# Patient Record
Sex: Female | Born: 1970 | Race: Black or African American | Hispanic: No | Marital: Single | Smoking: Never smoker
Health system: Southern US, Community
[De-identification: ages and names within clinical notes are randomized; demographics above are authoritative.]

## PROBLEM LIST (undated history)

## (undated) VITALS — BP 107/77 | HR 78 | Temp 96.9°F | Resp 18

## (undated) DIAGNOSIS — C787 Secondary malignant neoplasm of liver and intrahepatic bile duct: Secondary | ICD-10-CM

## (undated) DIAGNOSIS — IMO0002 Reserved for concepts with insufficient information to code with codable children: Secondary | ICD-10-CM

## (undated) DIAGNOSIS — E669 Obesity, unspecified: Secondary | ICD-10-CM

## (undated) DIAGNOSIS — K5900 Constipation, unspecified: Secondary | ICD-10-CM

## (undated) DIAGNOSIS — C801 Malignant (primary) neoplasm, unspecified: Secondary | ICD-10-CM

## (undated) DIAGNOSIS — D649 Anemia, unspecified: Secondary | ICD-10-CM

## (undated) DIAGNOSIS — C189 Malignant neoplasm of colon, unspecified: Secondary | ICD-10-CM

## (undated) DIAGNOSIS — G43909 Migraine, unspecified, not intractable, without status migrainosus: Secondary | ICD-10-CM

## (undated) HISTORY — DX: Constipation, unspecified: K59.00

## (undated) HISTORY — DX: Malignant neoplasm of colon, unspecified: C18.9

## (undated) HISTORY — DX: Migraine, unspecified, not intractable, without status migrainosus: G43.909

## (undated) HISTORY — DX: Reserved for concepts with insufficient information to code with codable children: IMO0002

## (undated) HISTORY — DX: Anemia, unspecified: D64.9

## (undated) HISTORY — DX: Secondary malignant neoplasm of liver and intrahepatic bile duct: C78.7

## (undated) HISTORY — DX: Malignant (primary) neoplasm, unspecified: C80.1

## (undated) HISTORY — DX: Obesity, unspecified: E66.9

---

## 2003-10-28 HISTORY — PX: TUBAL LIGATION: SHX77

## 2004-06-10 ENCOUNTER — Inpatient Hospital Stay (HOSPITAL_COMMUNITY): Admission: AD | Admit: 2004-06-10 | Discharge: 2004-06-12 | Payer: Self-pay | Admitting: Gynecology

## 2006-04-03 ENCOUNTER — Emergency Department: Payer: Self-pay | Admitting: Emergency Medicine

## 2006-11-05 ENCOUNTER — Ambulatory Visit: Payer: Self-pay | Admitting: Obstetrics and Gynecology

## 2010-12-11 ENCOUNTER — Ambulatory Visit: Payer: Self-pay | Admitting: Obstetrics and Gynecology

## 2012-01-01 ENCOUNTER — Ambulatory Visit: Payer: Self-pay | Admitting: Obstetrics and Gynecology

## 2012-01-13 ENCOUNTER — Ambulatory Visit: Payer: Self-pay | Admitting: Obstetrics and Gynecology

## 2012-02-09 ENCOUNTER — Ambulatory Visit: Payer: Self-pay | Admitting: Surgery

## 2012-10-27 HISTORY — PX: CHOLECYSTECTOMY: SHX55

## 2013-04-04 ENCOUNTER — Ambulatory Visit: Payer: Self-pay | Admitting: Family Medicine

## 2013-04-18 ENCOUNTER — Ambulatory Visit: Payer: Self-pay | Admitting: Surgery

## 2013-04-18 LAB — CBC WITH DIFFERENTIAL/PLATELET
Basophil #: 0.1 10*3/uL (ref 0.0–0.1)
Eosinophil #: 0.2 10*3/uL (ref 0.0–0.7)
Eosinophil %: 2.5 %
HCT: 35 % (ref 35.0–47.0)
HGB: 11.7 g/dL — ABNORMAL LOW (ref 12.0–16.0)
Lymphocyte #: 2 10*3/uL (ref 1.0–3.6)
MCHC: 33.4 g/dL (ref 32.0–36.0)
MCV: 81 fL (ref 80–100)
Monocyte %: 8.3 %
RBC: 4.35 10*6/uL (ref 3.80–5.20)
WBC: 7.3 10*3/uL (ref 3.6–11.0)

## 2013-04-18 LAB — COMPREHENSIVE METABOLIC PANEL
Chloride: 110 mmol/L — ABNORMAL HIGH (ref 98–107)
Co2: 30 mmol/L (ref 21–32)
EGFR (Non-African Amer.): >60
Glucose: 77 mg/dL (ref 65–99)
Osmolality: 281 (ref 275–301)
Potassium: 3.7 mmol/L (ref 3.5–5.1)
SGOT(AST): 9 U/L — ABNORMAL LOW (ref 15–37)
SGPT (ALT): 14 U/L (ref 12–78)
Sodium: 143 mmol/L (ref 136–145)
Total Protein: 6.9 g/dL (ref 6.4–8.2)

## 2013-04-25 ENCOUNTER — Ambulatory Visit: Payer: Self-pay | Admitting: Surgery

## 2013-04-25 LAB — HEPATIC FUNCTION PANEL A (ARMC)
Alkaline Phosphatase: 53 U/L (ref 50–136)
Bilirubin, Direct: 0.1 mg/dL (ref 0.00–0.20)
Bilirubin,Total: 0.6 mg/dL (ref 0.2–1.0)

## 2013-04-28 ENCOUNTER — Ambulatory Visit: Payer: Self-pay | Admitting: Surgery

## 2013-05-02 LAB — PATHOLOGY REPORT

## 2013-10-11 ENCOUNTER — Emergency Department: Payer: Self-pay | Admitting: Emergency Medicine

## 2013-10-11 LAB — URINALYSIS, COMPLETE
Bacteria: NONE SEEN
Bilirubin,UR: NEGATIVE
Glucose,UR: NEGATIVE mg/dL (ref 0–75)
Ketone: NEGATIVE
Ph: 7 (ref 4.5–8.0)
Protein: NEGATIVE
RBC,UR: 1 /HPF (ref 0–5)
Squamous Epithelial: 7

## 2013-10-11 LAB — COMPREHENSIVE METABOLIC PANEL
Anion Gap: 3 — ABNORMAL LOW (ref 7–16)
BUN: 6 mg/dL — ABNORMAL LOW (ref 7–18)
Bilirubin,Total: 0.4 mg/dL (ref 0.2–1.0)
Calcium, Total: 9.5 mg/dL (ref 8.5–10.1)
Chloride: 107 mmol/L (ref 98–107)
Co2: 28 mmol/L (ref 21–32)
Creatinine: 0.81 mg/dL (ref 0.60–1.30)
EGFR (Non-African Amer.): >60
Osmolality: 273 (ref 275–301)
SGOT(AST): 22 U/L (ref 15–37)
Sodium: 138 mmol/L (ref 136–145)
Total Protein: 7.6 g/dL (ref 6.4–8.2)

## 2013-10-11 LAB — CBC
HGB: 10.4 g/dL — ABNORMAL LOW (ref 12.0–16.0)
MCHC: 32.3 g/dL (ref 32.0–36.0)
MCV: 73 fL — ABNORMAL LOW (ref 80–100)
RBC: 4.38 10*6/uL (ref 3.80–5.20)
RDW: 15.7 % — ABNORMAL HIGH (ref 11.5–14.5)

## 2013-10-11 LAB — LIPASE, BLOOD: Lipase: 138 U/L (ref 73–393)

## 2013-10-13 LAB — URINE CULTURE

## 2013-10-27 DIAGNOSIS — C801 Malignant (primary) neoplasm, unspecified: Secondary | ICD-10-CM

## 2013-10-27 HISTORY — DX: Malignant (primary) neoplasm, unspecified: C80.1

## 2013-10-27 HISTORY — PX: UPPER GI ENDOSCOPY: SHX6162

## 2013-10-27 HISTORY — PX: COLONOSCOPY: SHX174

## 2014-01-25 DIAGNOSIS — C189 Malignant neoplasm of colon, unspecified: Secondary | ICD-10-CM

## 2014-01-25 DIAGNOSIS — C787 Secondary malignant neoplasm of liver and intrahepatic bile duct: Secondary | ICD-10-CM

## 2014-01-25 HISTORY — DX: Secondary malignant neoplasm of liver and intrahepatic bile duct: C78.7

## 2014-01-25 HISTORY — DX: Malignant neoplasm of colon, unspecified: C18.9

## 2014-02-02 ENCOUNTER — Ambulatory Visit: Payer: Self-pay | Admitting: Gastroenterology

## 2014-02-03 ENCOUNTER — Ambulatory Visit: Payer: Self-pay | Admitting: Oncology

## 2014-02-03 ENCOUNTER — Ambulatory Visit: Payer: Self-pay | Admitting: Obstetrics and Gynecology

## 2014-02-03 LAB — HEMOGLOBIN: HGB: 7.7 g/dL — ABNORMAL LOW (ref 12.0–16.0)

## 2014-02-04 LAB — CEA: CEA: 7.9 ng/mL — ABNORMAL HIGH (ref 0.0–4.7)

## 2014-02-04 LAB — CANCER ANTIGEN 19-9: CA 19-9: <1 U/mL (ref 0–35)

## 2014-02-06 ENCOUNTER — Ambulatory Visit: Payer: Self-pay | Admitting: Gastroenterology

## 2014-02-07 ENCOUNTER — Ambulatory Visit: Payer: Self-pay | Admitting: Obstetrics and Gynecology

## 2014-02-08 LAB — PATHOLOGY REPORT

## 2014-02-09 ENCOUNTER — Encounter: Payer: Self-pay | Admitting: General Surgery

## 2014-02-09 ENCOUNTER — Ambulatory Visit (INDEPENDENT_AMBULATORY_CARE_PROVIDER_SITE_OTHER): Payer: 59 | Admitting: General Surgery

## 2014-02-09 VITALS — BP 110/70 | HR 74 | Resp 14 | Ht 65.0 in | Wt 158.0 lb

## 2014-02-09 DIAGNOSIS — C189 Malignant neoplasm of colon, unspecified: Secondary | ICD-10-CM

## 2014-02-09 DIAGNOSIS — D500 Iron deficiency anemia secondary to blood loss (chronic): Secondary | ICD-10-CM

## 2014-02-09 DIAGNOSIS — K869 Disease of pancreas, unspecified: Secondary | ICD-10-CM

## 2014-02-09 DIAGNOSIS — K8689 Other specified diseases of pancreas: Secondary | ICD-10-CM

## 2014-02-09 LAB — COMPREHENSIVE METABOLIC PANEL
ALBUMIN: 2.9 g/dL — AB (ref 3.4–5.0)
ALK PHOS: 68 U/L
AST: 11 U/L — AB (ref 15–37)
Anion Gap: 11 (ref 7–16)
BILIRUBIN TOTAL: 0.3 mg/dL (ref 0.2–1.0)
BUN: 3 mg/dL — AB (ref 7–18)
CALCIUM: 9.3 mg/dL (ref 8.5–10.1)
CHLORIDE: 105 mmol/L (ref 98–107)
CO2: 27 mmol/L (ref 21–32)
Creatinine: 0.79 mg/dL (ref 0.60–1.30)
EGFR (African American): >60
EGFR (Non-African Amer.): >60
GLUCOSE: 101 mg/dL — AB (ref 65–99)
Osmolality: 282 (ref 275–301)
POTASSIUM: 3 mmol/L — AB (ref 3.5–5.1)
SGPT (ALT): 11 U/L — ABNORMAL LOW (ref 12–78)
Sodium: 143 mmol/L (ref 136–145)
Total Protein: 7.3 g/dL (ref 6.4–8.2)

## 2014-02-09 LAB — CBC CANCER CENTER
BASOS PCT: 0.8 %
Basophil #: 0.1 x10 3/mm (ref 0.0–0.1)
Eosinophil #: 0.3 x10 3/mm (ref 0.0–0.7)
Eosinophil %: 5 %
HCT: 27 % — AB (ref 35.0–47.0)
HGB: 8.3 g/dL — ABNORMAL LOW (ref 12.0–16.0)
LYMPHS ABS: 1.6 x10 3/mm (ref 1.0–3.6)
Lymphocyte %: 23.7 %
MCH: 21.3 pg — AB (ref 26.0–34.0)
MCHC: 30.9 g/dL — AB (ref 32.0–36.0)
MCV: 69 fL — ABNORMAL LOW (ref 80–100)
Monocyte #: 0.5 x10 3/mm (ref 0.2–0.9)
Monocyte %: 7.5 %
Neutrophil #: 4.3 x10 3/mm (ref 1.4–6.5)
Neutrophil %: 63 %
Platelet: 518 x10 3/mm — ABNORMAL HIGH (ref 150–440)
RBC: 3.92 10*6/uL (ref 3.80–5.20)
RDW: 20.4 % — ABNORMAL HIGH (ref 11.5–14.5)
WBC: 6.8 x10 3/mm (ref 3.6–11.0)

## 2014-02-09 NOTE — Patient Instructions (Addendum)
The patient is aware to call back for any questions or concerns.  Patient is scheduled for surgery at [facility] on 02/17/14. She will pre admit by phone on 02/13/14.

## 2014-02-09 NOTE — Progress Notes (Signed)
Patient ID: Misty Mack, female   DOB: 08/10/1971, 43 y.o.   MRN: [id number]  Chief Complaint  Patient presents with  . Other    mass in colon    HPI Misty Mack is a 43 y.o. female who presents for an evaluation of a mass located in the colon. The patient had symptoms of weight loss and abdominal pain that prompted a CT scan to be done in March. This suggested a mass in the distal transverse colon. Colonoscopy was attempted in March 2015, the patient was unable to tolerate the prep. The patient subsequently had a colonoscopy on April 13th showing a near obstructing mass in the distal transverse colon. The patient states she started having abdominal pain in November 2014. She has had a weight loss of 35-40 lbs.  CT scan identified a mass in the mid body of the pancreas as well. The patient has been evaluated by medical oncology. Urgent surgical assessment was requested to to fears of colonic obstruction.  The patient is not aware of seeing any blood in her bowels. She had noticed an increasing sense of constipation. She does admit to food for age to minimize discomfort. 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  The patient had undergone cholecystectomy and 2014 under the care of Misty Mack, M.D. Her mid abdominal pain improved after that procedure. One recurrent abdominal pain developed in November 2014 it was a different character than that she experienced earlier. She had been quite happy with Misty Mack care. Several family members have been cared for through this office, and she had requested assessment here for her current issues.  HPI  Past Medical History  Diagnosis Date  . Ulcer   . Anemia   . Cancer 2015    colon    Past Surgical History  Procedure Laterality Date  . Cholecystectomy  2014  . Tubal ligation  2005  . Colonoscopy  2015  . Upper gi endoscopy  2015    History reviewed. No pertinent family  history.  Social History History  Substance Use Topics  . Smoking status: Never Smoker   . Smokeless tobacco: Not on file  . Alcohol Use: No    No Known Allergies  Current Outpatient Prescriptions  Medication Sig Dispense Refill  . acetaminophen (TYLENOL) 500 MG tablet Take 500 mg by mouth every 6 (six) hours as needed.      . ferrous fumarate-iron polysaccharide complex (TANDEM) 162-115.2 MG CAPS Take 1 capsule by mouth 2 (two) times daily.      . pantoprazole (PROTONIX) 40 MG tablet Take 40 mg by mouth 2 (two) times daily.      . potassium chloride (K-DUR,KLOR-CON) 10 MEQ tablet 10 meq 2 times daily for 5 days.       No current facility-administered medications for this visit.    Review of Systems Review of Systems  Constitutional: Positive for unexpected weight change.  Respiratory: Negative.   Cardiovascular: Negative.     Blood pressure 110/70, pulse 74, resp. rate 14, height 5\' 5"  (1.651 m), weight 158 lb (71.668 kg), last menstrual period 01/11/2014.  Physical Exam Physical Exam  Constitutional: She is oriented to person, place, and time. She appears well-developed and well-nourished.  Neck: Neck supple. No thyromegaly present.  Cardiovascular: Normal rate, regular rhythm, normal heart sounds and normal pulses.   No murmur heard. Pulmonary/Chest: Effort normal and breath sounds normal.  Abdominal: Soft. Normal appearance and bowel sounds are  normal. There is no hepatosplenomegaly. There is no tenderness. No hernia.  Lymphadenopathy:    She has no cervical adenopathy.  Neurological: She is alert and oriented to person, place, and time.  Skin: Skin is warm and dry.    Data Reviewed Operative note 2014 were reviewed.  Laboratory and radiologic studies 2015 were personally reviewed.  Recently completed CT shows a 3 cm hypodense mass in the body of the pancreas lateral to the SMA. There is thickening of the bowel wall the distal transverse colon just proximal to  splenic flexure. Possible enlarged node in the mesentery.  Elevated CEA at 9.4. Normal CA 19-9 at less than one.  Marked anemia responding to intravenous iron therapy with a hemoglobin of 8.3. Laboratory studies from 2014 showed a normal hemoglobin.  Assessment    Carcinoma of the transverse colon.  Pancreatic mass.  Anemia secondary to blood loss.     Plan    The patient's case was trans-discussed at the [facility] tumor board after her office evaluation. The main question was whether referral to a tertiary center for combined pancreatectomy and colon resection was urgently required or whether management of the colon cancer without pancreatectomy, lowering the risk for complications from both procedures would be appropriate. The consensus of bleeding was that with the normal CEA 19-9 this is likely a benign pancreatic process and that this could be done with a separate time. If possible, FNA of the pancreatic area could be obtained during her operative procedure.  The patient medical history was reviewed with Misty Mack, M.D. From anesthesia. He did not feel the patient required in person interview, but did request a type and screen.     Patient is scheduled for surgery at [facility] on 02/17/14. She will pre admit by phone on 02/13/14.  PCP: Misty Mack Ref. MD: Dr. Belinda Block Mack 02/10/2014, 5:07 AM

## 2014-02-10 ENCOUNTER — Telehealth: Payer: Self-pay

## 2014-02-10 ENCOUNTER — Other Ambulatory Visit: Payer: Self-pay | Admitting: General Surgery

## 2014-02-10 DIAGNOSIS — C189 Malignant neoplasm of colon, unspecified: Secondary | ICD-10-CM

## 2014-02-10 DIAGNOSIS — D500 Iron deficiency anemia secondary to blood loss (chronic): Secondary | ICD-10-CM | POA: Insufficient documentation

## 2014-02-10 DIAGNOSIS — K8689 Other specified diseases of pancreas: Secondary | ICD-10-CM | POA: Insufficient documentation

## 2014-02-10 MED ORDER — METRONIDAZOLE 500 MG PO TABS
500.0000 mg | ORAL_TABLET | Freq: Once | ORAL | Status: AC
Start: 2014-02-10 — End: 2014-02-20

## 2014-02-10 MED ORDER — NEOMYCIN SULFATE 500 MG PO TABS: 1000.0000 mg | ORAL_TABLET | Freq: Once | ORAL | Status: AC

## 2014-02-10 NOTE — Telephone Encounter (Signed)
Message copied by Lesly Rubenstein on Fri Feb 10, 2014 10:12 AM ------      Message from: [place], [name]      Created: Fri Feb 10, 2014  5:16 AM       Prepped for upcoming colon surgery:            Full at the diet beginning Wednesday, April 22. Used to Ensure supplements as encouraged.            Citrate of magnesia: 10 ounces by mouth on Thursday, April 23.            1 quart tap water enema a.m. The morning of surgery at home.            Neomycin (1 gm) and Flagyl (500 mg): 6 PM and 11 PM the night prior to surgery. ------

## 2014-02-10 NOTE — Telephone Encounter (Signed)
Reviewed colon surgery prep with patient. Also notified the patient that she would be asked to sign a consent for blood transfusion. She was amendable to this. Patient is to start a full liquid diet on 02/15/14 and to use Ensure supplements. On 02/16/14 patient is to start clear liquids and will take 10 oz of citrate of magnesia. At 6 pm on 02/16/14 she will take 1 gm Neomycin and 500mg  of Flagyl, she will repeat this at 11pm. She is to be NPO after 12 midnight the night before her surgery. On 02/17/14 she will do a 1 quart tap water enema at 8 am at her home. Prescriptions have been sent to patient's pharmacy.

## 2014-02-13 ENCOUNTER — Ambulatory Visit: Payer: Self-pay | Admitting: General Surgery

## 2014-02-13 LAB — POTASSIUM: Potassium: 2.9 mmol/L — ABNORMAL LOW (ref 3.5–5.1)

## 2014-02-14 ENCOUNTER — Encounter: Payer: Self-pay | Admitting: General Surgery

## 2014-02-14 ENCOUNTER — Telehealth: Payer: Self-pay | Admitting: *Deleted

## 2014-02-14 NOTE — Telephone Encounter (Signed)
Pt notified. RX called in.

## 2014-02-14 NOTE — Telephone Encounter (Signed)
Message copied by Carson Myrtle on Tue Feb 14, 2014  5:06 PM ------      Message from: Robert Bellow      Created: Tue Feb 14, 2014  4:38 PM       Patient previously on KCL 10 mEq BID. If she has completed this RX, send RX for KCL 20 mEq  (#30) in preparation for surgery on April 24.   Thanks.      ----- Message -----         From: Darrin Nipper, CMA         Sent: 02/14/2014   9:27 AM           To: Robert Bellow, MD                   ------

## 2014-02-17 ENCOUNTER — Inpatient Hospital Stay: Payer: Self-pay | Admitting: General Surgery

## 2014-02-17 DIAGNOSIS — D379 Neoplasm of uncertain behavior of digestive organ, unspecified: Secondary | ICD-10-CM

## 2014-02-17 DIAGNOSIS — C185 Malignant neoplasm of splenic flexure: Secondary | ICD-10-CM

## 2014-02-18 LAB — CREATININE, SERUM
Creatinine: 0.61 mg/dL (ref 0.60–1.30)
EGFR (African American): >60

## 2014-02-19 LAB — CBC WITH DIFFERENTIAL/PLATELET
Basophil #: 0 10*3/uL (ref 0.0–0.1)
Basophil %: 0.5 %
EOS PCT: 2.1 %
Eosinophil #: 0.2 10*3/uL (ref 0.0–0.7)
HCT: 24.2 % — ABNORMAL LOW (ref 35.0–47.0)
HGB: 7.9 g/dL — AB (ref 12.0–16.0)
Lymphocyte #: 1.2 10*3/uL (ref 1.0–3.6)
Lymphocyte %: 14 %
MCH: 23.9 pg — AB (ref 26.0–34.0)
MCHC: 32.5 g/dL (ref 32.0–36.0)
MCV: 74 fL — AB (ref 80–100)
MONOS PCT: 8.8 %
Monocyte #: 0.8 x10 3/mm (ref 0.2–0.9)
NEUTROS PCT: 74.6 %
Neutrophil #: 6.4 10*3/uL (ref 1.4–6.5)
PLATELETS: 246 10*3/uL (ref 150–440)
RBC: 3.29 10*6/uL — ABNORMAL LOW (ref 3.80–5.20)
RDW: 27.1 % — ABNORMAL HIGH (ref 11.5–14.5)
WBC: 8.6 10*3/uL (ref 3.6–11.0)

## 2014-02-19 LAB — LIPASE, BLOOD: LIPASE: 67 U/L — AB (ref 73–393)

## 2014-02-19 LAB — BASIC METABOLIC PANEL
Anion Gap: 4 — ABNORMAL LOW (ref 7–16)
BUN: 5 mg/dL — ABNORMAL LOW (ref 7–18)
CHLORIDE: 107 mmol/L (ref 98–107)
CO2: 31 mmol/L (ref 21–32)
Calcium, Total: 9 mg/dL (ref 8.5–10.1)
Creatinine: 0.66 mg/dL (ref 0.60–1.30)
GLUCOSE: 96 mg/dL (ref 65–99)
OSMOLALITY: 280 (ref 275–301)
POTASSIUM: 3.8 mmol/L (ref 3.5–5.1)
Sodium: 142 mmol/L (ref 136–145)

## 2014-02-20 ENCOUNTER — Encounter: Payer: Self-pay | Admitting: General Surgery

## 2014-02-21 ENCOUNTER — Encounter: Payer: Self-pay | Admitting: General Surgery

## 2014-02-21 LAB — CBC WITH DIFFERENTIAL/PLATELET
BASOS ABS: 0 10*3/uL (ref 0.0–0.1)
Basophil %: 0.6 %
Eosinophil #: 0.4 10*3/uL (ref 0.0–0.7)
Eosinophil %: 7.3 %
HCT: 23.1 % — ABNORMAL LOW (ref 35.0–47.0)
HGB: 7.6 g/dL — ABNORMAL LOW (ref 12.0–16.0)
LYMPHS ABS: 1.1 10*3/uL (ref 1.0–3.6)
Lymphocyte %: 19.8 %
MCH: 24.1 pg — ABNORMAL LOW (ref 26.0–34.0)
MCHC: 33.1 g/dL (ref 32.0–36.0)
MCV: 73 fL — ABNORMAL LOW (ref 80–100)
MONOS PCT: 9.2 %
Monocyte #: 0.5 x10 3/mm (ref 0.2–0.9)
NEUTROS ABS: 3.4 10*3/uL (ref 1.4–6.5)
NEUTROS PCT: 63.1 %
Platelet: 262 10*3/uL (ref 150–440)
RBC: 3.16 10*6/uL — ABNORMAL LOW (ref 3.80–5.20)
RDW: 26.2 % — AB (ref 11.5–14.5)
WBC: 5.4 10*3/uL (ref 3.6–11.0)

## 2014-02-22 ENCOUNTER — Encounter: Payer: Self-pay | Admitting: General Surgery

## 2014-02-23 ENCOUNTER — Encounter: Payer: Self-pay | Admitting: General Surgery

## 2014-02-24 ENCOUNTER — Ambulatory Visit: Payer: Self-pay | Admitting: Oncology

## 2014-02-24 ENCOUNTER — Ambulatory Visit (INDEPENDENT_AMBULATORY_CARE_PROVIDER_SITE_OTHER): Payer: 59 | Admitting: *Deleted

## 2014-02-24 DIAGNOSIS — C189 Malignant neoplasm of colon, unspecified: Secondary | ICD-10-CM

## 2014-02-24 NOTE — Patient Instructions (Signed)
Patient to return as scheduled.  

## 2014-02-24 NOTE — Progress Notes (Signed)
The stapels were removed and steri strips applied. Patient advised of next appointment. The patient is aware to call back for any questions or concerns.

## 2014-02-27 LAB — CANCER CENTER HEMOGLOBIN: HGB: 10.7 g/dL — AB (ref 12.0–16.0)

## 2014-03-02 ENCOUNTER — Ambulatory Visit (INDEPENDENT_AMBULATORY_CARE_PROVIDER_SITE_OTHER): Payer: 59 | Admitting: General Surgery

## 2014-03-02 ENCOUNTER — Encounter: Payer: Self-pay | Admitting: General Surgery

## 2014-03-02 VITALS — BP 112/68 | HR 78 | Resp 16 | Ht 65.0 in | Wt 142.0 lb

## 2014-03-02 DIAGNOSIS — C189 Malignant neoplasm of colon, unspecified: Secondary | ICD-10-CM

## 2014-03-02 NOTE — Progress Notes (Signed)
Patient ID: Misty Mack, female   DOB: 04-May-1971, 43 y.o.   MRN: [id number]  Chief Complaint  Patient presents with  . Routine Post Op    colon resection and to discuss port placement    HPI Misty Mack is a 43 y.o. female here today for her colon resection done 02/17/14.Patient states she is doing well overall but has had some nausea today.Discuss port placement. Her appetite has not returned to normal. Her mother reports encouraging her to keep frequent small meals as discussed at the time of discharge. The patient is not having any postprandial pain, she reports rather that she has no taste for food. HPI  Past Medical History  Diagnosis Date  . Ulcer   . Anemia   . Cancer 2015    colon    Past Surgical History  Procedure Laterality Date  . Cholecystectomy  2014  . Tubal ligation  2005  . Colonoscopy  2015  . Upper gi endoscopy  2015    History reviewed. No pertinent family history.  Social History History  Substance Use Topics  . Smoking status: Never Smoker   . Smokeless tobacco: Not on file  . Alcohol Use: No    No Known Allergies  Current Outpatient Prescriptions  Medication Sig Dispense Refill  . acetaminophen (TYLENOL) 500 MG tablet Take 500 mg by mouth every 6 (six) hours as needed.      . ferrous fumarate-iron polysaccharide complex (TANDEM) 162-115.2 MG CAPS Take 1 capsule by mouth 2 (two) times daily.      . pantoprazole (PROTONIX) 40 MG tablet Take 40 mg by mouth 2 (two) times daily.      . potassium chloride (K-DUR,KLOR-CON) 10 MEQ tablet 10 meq 2 times daily for 5 days.       No current facility-administered medications for this visit.    Review of Systems Review of Systems  Constitutional: Negative.   Respiratory: Negative.   Cardiovascular: Negative.   Gastrointestinal: Positive for nausea.    Blood pressure 112/68, pulse 78, resp. rate 16, height 5\' 5"  (1.651 m), weight 142 lb (64.411 kg), last menstrual period  01/11/2014.  Physical Exam Physical Exam  Constitutional: She appears well-developed and well-nourished.  Neck: Neck supple. No thyromegaly present.  Cardiovascular: Normal rate, regular rhythm and normal heart sounds.   No murmur heard. Pulmonary/Chest: Effort normal and breath sounds normal.  Abdominal: Soft. Normal appearance and bowel sounds are normal. There is no hepatosplenomegaly. There is no tenderness. No hernia.  Little irritated area at the top of midline incision. 3 x 7 mm opening with clean tissue.  Lymphadenopathy:    She has no cervical adenopathy.    She has no axillary adenopathy.    Data Reviewed The cancer of the splenic flexure invaded the diaphragm. Perforation noted.  Hepatic metastasis identified. Pancreatic biopsy was negative.Final tumor staging: pT3,N2a,M1a.  Of note, the left lobe liver metastasis was not identified on preoperative imaging.  Assessment    Metastatic colon cancer.     Plan    The case has been reviewed with the medical oncology service. The patient is a candidate for adjuvant chemotherapy. The need for central venous access was reviewed. The risks associated with this procedure including those related to vascular pulmonary injury were discussed.  We'll plan to proceed week of 03/13/2014.  The importance of frequent small meals, the used to nutritional supplements, frequent snacking and a high calorie diet was again emphasized.     Patient is scheduled  for Port placement at [facility] on 03/13/14. She will pre admit by phone on 03/06/14. Patient is aware of date and instructions.  PCP: Hattie Perch Bristal Steffy 03/03/2014, 7:05 AM

## 2014-03-02 NOTE — Patient Instructions (Addendum)
Patient to be scheduled for port placement. The patient is aware to call back for any questions or concerns.  Patient is scheduled for Port placement at [facility] on 03/13/14. She will pre admit by phone on 03/06/14. Patient is aware of date and instructions.

## 2014-03-03 ENCOUNTER — Other Ambulatory Visit: Payer: Self-pay | Admitting: General Surgery

## 2014-03-03 DIAGNOSIS — C189 Malignant neoplasm of colon, unspecified: Secondary | ICD-10-CM

## 2014-03-06 ENCOUNTER — Telehealth: Payer: Self-pay | Admitting: General Surgery

## 2014-03-06 DIAGNOSIS — R197 Diarrhea, unspecified: Secondary | ICD-10-CM

## 2014-03-06 DIAGNOSIS — R110 Nausea: Secondary | ICD-10-CM

## 2014-03-06 MED ORDER — PROMETHAZINE HCL 25 MG PO TABS: 25.0000 mg | ORAL_TABLET | Freq: Four times a day (QID) | ORAL | Status: DC | PRN

## 2014-03-06 MED ORDER — DIPHENOXYLATE-ATROPINE 2.5-0.025 MG PO TABS: 1.0000 | ORAL_TABLET | Freq: Four times a day (QID) | ORAL | Status: DC | PRN

## 2014-03-06 NOTE — Telephone Encounter (Signed)
Diarrhea last PM, 5 watery stools today. Mild nausea after meals. No vomiting. No abdominal distension, fever, chills.  No other family members report ill.  Will rx w/ Phenergan and Lomotil.  Patient to give phone f/u tomorrow late AM, early PM.

## 2014-03-07 ENCOUNTER — Telehealth: Payer: Self-pay | Admitting: *Deleted

## 2014-03-07 NOTE — Telephone Encounter (Signed)
She forgot to call with a status update. She is better and was able to go to work 9 to 2 today. No further diarrhea and only slight nausea. States she feels it may have been the flavor of yogurt she ate. Appreciates our call.

## 2014-03-09 LAB — PATHOLOGY REPORT

## 2014-03-13 ENCOUNTER — Encounter: Payer: Self-pay | Admitting: General Surgery

## 2014-03-13 ENCOUNTER — Ambulatory Visit: Payer: Self-pay | Admitting: General Surgery

## 2014-03-13 ENCOUNTER — Other Ambulatory Visit: Payer: Self-pay | Admitting: General Surgery

## 2014-03-13 ENCOUNTER — Telehealth: Payer: Self-pay

## 2014-03-13 DIAGNOSIS — E876 Hypokalemia: Secondary | ICD-10-CM

## 2014-03-13 DIAGNOSIS — C189 Malignant neoplasm of colon, unspecified: Secondary | ICD-10-CM

## 2014-03-13 LAB — COMPREHENSIVE METABOLIC PANEL
ALBUMIN: 2.9 g/dL — AB (ref 3.4–5.0)
ALK PHOS: 60 U/L
ALT: 11 U/L — AB (ref 12–78)
Anion Gap: 6 — ABNORMAL LOW (ref 7–16)
BILIRUBIN TOTAL: 0.4 mg/dL (ref 0.2–1.0)
BUN: 3 mg/dL — ABNORMAL LOW (ref 7–18)
CALCIUM: 8.6 mg/dL (ref 8.5–10.1)
CO2: 28 mmol/L (ref 21–32)
Chloride: 104 mmol/L (ref 98–107)
Creatinine: 0.78 mg/dL (ref 0.60–1.30)
EGFR (African American): >60
EGFR (Non-African Amer.): >60
GLUCOSE: 107 mg/dL — AB (ref 65–99)
Osmolality: 273 (ref 275–301)
Potassium: 2.8 mmol/L — ABNORMAL LOW (ref 3.5–5.1)
SGOT(AST): 26 U/L (ref 15–37)
Sodium: 138 mmol/L (ref 136–145)
Total Protein: 6.6 g/dL (ref 6.4–8.2)

## 2014-03-13 LAB — CBC WITH DIFFERENTIAL/PLATELET
BASOS PCT: 0.5 %
Basophil #: 0 10*3/uL (ref 0.0–0.1)
EOS ABS: 0.1 10*3/uL (ref 0.0–0.7)
EOS PCT: 2.1 %
HCT: 36.6 % (ref 35.0–47.0)
HGB: 11.9 g/dL — ABNORMAL LOW (ref 12.0–16.0)
LYMPHS PCT: 23.7 %
Lymphocyte #: 1.5 10*3/uL (ref 1.0–3.6)
MCH: 26 pg (ref 26.0–34.0)
MCHC: 32.6 g/dL (ref 32.0–36.0)
MCV: 80 fL (ref 80–100)
Monocyte #: 0.5 x10 3/mm (ref 0.2–0.9)
Monocyte %: 7.8 %
NEUTROS PCT: 65.9 %
Neutrophil #: 4.1 10*3/uL (ref 1.4–6.5)
Platelet: 249 10*3/uL (ref 150–440)
RBC: 4.58 10*6/uL (ref 3.80–5.20)
RDW: 31.1 % — ABNORMAL HIGH (ref 11.5–14.5)
WBC: 6.2 10*3/uL (ref 3.6–11.0)

## 2014-03-13 LAB — LIPASE, BLOOD: Lipase: 133 U/L (ref 73–393)

## 2014-03-13 MED ORDER — POTASSIUM CHLORIDE 20 MEQ PO PACK: 20.0000 meq | PACK | Freq: Three times a day (TID) | ORAL | Status: DC

## 2014-03-13 NOTE — Telephone Encounter (Signed)
Spoke with patient regarding her Potassium order. Patient is aware of instructions.

## 2014-03-13 NOTE — Telephone Encounter (Signed)
Message copied by Lesly Rubenstein on Mon Mar 13, 2014  1:36 PM ------      Message from: [place], [place] W      Created: Mon Mar 13, 2014  1:06 PM       Please send RX for: KCL powder packet, 20 mEq,  # 30.  One packet in liquid of choice three times a day.  2 refills.             Notify patient/ mother the patient needs to start on this TODAY, with at lest two doses today.   ------

## 2014-03-13 NOTE — Telephone Encounter (Signed)
Message left for patient to call back to go over instructions for Potassium Supplement.

## 2014-03-13 NOTE — Progress Notes (Signed)
Patient ID: Misty Mack, female   DOB: [birth year], 43 y.o.   MRN: [id number] Patient has been scheduled for an Upper GI series with small bowel follow through at [facility] on 03/15/14 at 10:00 am with a blood pregnancy test to be done 1 hour before. She will arrive by 9:00 am and have nothing to eat or drink after midnight the night before. Patient is aware of date, time, and all instructions.

## 2014-03-13 NOTE — Progress Notes (Signed)
Patient ID: Misty Mack, female   DOB: 15-Jan-1971, 43 y.o.   MRN: [id number] Labs show hypokalemia with K+ at 2.8. Likely contributing to poor po tolerance.  Has not been taking KCL tablets.  Will substitute KCL liquid.

## 2014-03-15 ENCOUNTER — Encounter: Payer: Self-pay | Admitting: General Surgery

## 2014-03-15 ENCOUNTER — Ambulatory Visit: Payer: Self-pay | Admitting: General Surgery

## 2014-03-15 LAB — HCG, QUANTITATIVE, PREGNANCY: Beta Hcg, Quant.: <1 m[IU]/mL — ABNORMAL LOW

## 2014-03-16 LAB — COMPREHENSIVE METABOLIC PANEL
ALK PHOS: 71 U/L
ANION GAP: 12 (ref 7–16)
Albumin: 2.9 g/dL — ABNORMAL LOW (ref 3.4–5.0)
BILIRUBIN TOTAL: 0.4 mg/dL (ref 0.2–1.0)
BUN: 1 mg/dL — ABNORMAL LOW (ref 7–18)
Calcium, Total: 8.8 mg/dL (ref 8.5–10.1)
Chloride: 104 mmol/L (ref 98–107)
Co2: 28 mmol/L (ref 21–32)
Creatinine: 0.8 mg/dL (ref 0.60–1.30)
EGFR (Non-African Amer.): >60
Glucose: 94 mg/dL (ref 65–99)
Osmolality: 282 (ref 275–301)
POTASSIUM: 2.6 mmol/L — AB (ref 3.5–5.1)
SGOT(AST): 59 U/L — ABNORMAL HIGH (ref 15–37)
SGPT (ALT): 23 U/L (ref 12–78)
Sodium: 144 mmol/L (ref 136–145)
TOTAL PROTEIN: 6.7 g/dL (ref 6.4–8.2)

## 2014-03-16 LAB — CBC CANCER CENTER
BASOS PCT: 1.6 %
Basophil #: 0.1 x10 3/mm (ref 0.0–0.1)
EOS ABS: 0.4 x10 3/mm (ref 0.0–0.7)
Eosinophil %: 7.8 %
HCT: 35.3 % (ref 35.0–47.0)
HGB: 11.7 g/dL — AB (ref 12.0–16.0)
Lymphocyte #: 1.7 x10 3/mm (ref 1.0–3.6)
Lymphocyte %: 31.6 %
MCH: 25.8 pg — ABNORMAL LOW (ref 26.0–34.0)
MCHC: 33.2 g/dL (ref 32.0–36.0)
MCV: 78 fL — ABNORMAL LOW (ref 80–100)
MONO ABS: 0.6 x10 3/mm (ref 0.2–0.9)
Monocyte %: 11.7 %
NEUTROS PCT: 47.3 %
Neutrophil #: 2.6 x10 3/mm (ref 1.4–6.5)
PLATELETS: 218 x10 3/mm (ref 150–440)
RBC: 4.54 10*6/uL (ref 3.80–5.20)
RDW: 31.4 % — ABNORMAL HIGH (ref 11.5–14.5)
WBC: 5.4 x10 3/mm (ref 3.6–11.0)

## 2014-03-16 LAB — POTASSIUM: Potassium: 3.3 mmol/L — ABNORMAL LOW (ref 3.5–5.1)

## 2014-03-16 LAB — MAGNESIUM: MAGNESIUM: 1.9 mg/dL

## 2014-03-23 LAB — COMPREHENSIVE METABOLIC PANEL
ALBUMIN: 2.6 g/dL — AB (ref 3.4–5.0)
ALT: 10 U/L — AB (ref 12–78)
ANION GAP: 12 (ref 7–16)
Alkaline Phosphatase: 75 U/L
BUN: 3 mg/dL — ABNORMAL LOW (ref 7–18)
Bilirubin,Total: 0.3 mg/dL (ref 0.2–1.0)
CHLORIDE: 104 mmol/L (ref 98–107)
Calcium, Total: 8.7 mg/dL (ref 8.5–10.1)
Co2: 26 mmol/L (ref 21–32)
Creatinine: 0.71 mg/dL (ref 0.60–1.30)
EGFR (Non-African Amer.): >60
GLUCOSE: 91 mg/dL (ref 65–99)
Osmolality: 279 (ref 275–301)
POTASSIUM: 3.3 mmol/L — AB (ref 3.5–5.1)
SGOT(AST): 15 U/L (ref 15–37)
Sodium: 142 mmol/L (ref 136–145)
Total Protein: 6.5 g/dL (ref 6.4–8.2)

## 2014-03-23 LAB — CBC CANCER CENTER
BASOS PCT: 0.8 %
Basophil #: 0 x10 3/mm (ref 0.0–0.1)
EOS ABS: 0.2 x10 3/mm (ref 0.0–0.7)
Eosinophil %: 4.5 %
HCT: 37 % (ref 35.0–47.0)
HGB: 12.2 g/dL (ref 12.0–16.0)
LYMPHS ABS: 1.6 x10 3/mm (ref 1.0–3.6)
Lymphocyte %: 31.7 %
MCH: 26.8 pg (ref 26.0–34.0)
MCHC: 33.1 g/dL (ref 32.0–36.0)
MCV: 81 fL (ref 80–100)
MONOS PCT: 10.1 %
Monocyte #: 0.5 x10 3/mm (ref 0.2–0.9)
NEUTROS PCT: 52.9 %
Neutrophil #: 2.7 x10 3/mm (ref 1.4–6.5)
Platelet: 226 x10 3/mm (ref 150–440)
RBC: 4.57 10*6/uL (ref 3.80–5.20)
RDW: 29.9 % — ABNORMAL HIGH (ref 11.5–14.5)
WBC: 5.1 x10 3/mm (ref 3.6–11.0)

## 2014-03-27 ENCOUNTER — Ambulatory Visit: Payer: Self-pay | Admitting: Oncology

## 2014-03-27 LAB — CBC CANCER CENTER
BASOS ABS: 0.1 x10 3/mm (ref 0.0–0.1)
BASOS PCT: 0.7 %
Eosinophil #: 0.1 x10 3/mm (ref 0.0–0.7)
Eosinophil %: 1.3 %
HCT: 38.3 % (ref 35.0–47.0)
HGB: 12.7 g/dL (ref 12.0–16.0)
Lymphocyte #: 2.1 x10 3/mm (ref 1.0–3.6)
Lymphocyte %: 23.8 %
MCH: 27.1 pg (ref 26.0–34.0)
MCHC: 33.1 g/dL (ref 32.0–36.0)
MCV: 82 fL (ref 80–100)
Monocyte #: 0.8 x10 3/mm (ref 0.2–0.9)
Monocyte %: 8.7 %
NEUTROS ABS: 5.7 x10 3/mm (ref 1.4–6.5)
NEUTROS PCT: 65.5 %
Platelet: 272 x10 3/mm (ref 150–440)
RBC: 4.67 10*6/uL (ref 3.80–5.20)
RDW: 28 % — AB (ref 11.5–14.5)
WBC: 8.7 x10 3/mm (ref 3.6–11.0)

## 2014-03-27 LAB — BASIC METABOLIC PANEL
ANION GAP: 10 (ref 7–16)
BUN: 5 mg/dL — ABNORMAL LOW (ref 7–18)
CO2: 28 mmol/L (ref 21–32)
Calcium, Total: 9.3 mg/dL (ref 8.5–10.1)
Chloride: 102 mmol/L (ref 98–107)
Creatinine: 0.7 mg/dL (ref 0.60–1.30)
EGFR (African American): >60
EGFR (Non-African Amer.): >60
Glucose: 111 mg/dL — ABNORMAL HIGH (ref 65–99)
Osmolality: 277 (ref 275–301)
Potassium: 3.5 mmol/L (ref 3.5–5.1)
Sodium: 140 mmol/L (ref 136–145)

## 2014-03-30 LAB — CBC CANCER CENTER
Basophil #: 0 x10 3/mm (ref 0.0–0.1)
Basophil %: 0.7 %
EOS ABS: 0.1 x10 3/mm (ref 0.0–0.7)
Eosinophil %: 1 %
HCT: 37.7 % (ref 35.0–47.0)
HGB: 12.6 g/dL (ref 12.0–16.0)
Lymphocyte #: 2.3 x10 3/mm (ref 1.0–3.6)
Lymphocyte %: 34.3 %
MCH: 27 pg (ref 26.0–34.0)
MCHC: 33.4 g/dL (ref 32.0–36.0)
MCV: 81 fL (ref 80–100)
Monocyte #: 0.6 x10 3/mm (ref 0.2–0.9)
Monocyte %: 9.2 %
NEUTROS ABS: 3.6 x10 3/mm (ref 1.4–6.5)
NEUTROS PCT: 54.8 %
Platelet: 284 x10 3/mm (ref 150–440)
RBC: 4.66 10*6/uL (ref 3.80–5.20)
RDW: 28.1 % — AB (ref 11.5–14.5)
WBC: 6.7 x10 3/mm (ref 3.6–11.0)

## 2014-03-30 LAB — COMPREHENSIVE METABOLIC PANEL
ANION GAP: 8 (ref 7–16)
AST: 18 U/L (ref 15–37)
Albumin: 2.8 g/dL — ABNORMAL LOW (ref 3.4–5.0)
Alkaline Phosphatase: 84 U/L
BUN: 4 mg/dL — ABNORMAL LOW (ref 7–18)
Bilirubin,Total: 0.5 mg/dL (ref 0.2–1.0)
CHLORIDE: 103 mmol/L (ref 98–107)
CO2: 26 mmol/L (ref 21–32)
CREATININE: 0.53 mg/dL — AB (ref 0.60–1.30)
Calcium, Total: 9.2 mg/dL (ref 8.5–10.1)
EGFR (African American): >60
EGFR (Non-African Amer.): >60
Glucose: 102 mg/dL — ABNORMAL HIGH (ref 65–99)
OSMOLALITY: 271 (ref 275–301)
Potassium: 3.1 mmol/L — ABNORMAL LOW (ref 3.5–5.1)
SGPT (ALT): 10 U/L — ABNORMAL LOW (ref 12–78)
SODIUM: 137 mmol/L (ref 136–145)
Total Protein: 6.7 g/dL (ref 6.4–8.2)

## 2014-03-31 LAB — CEA: CEA: 7 ng/mL — AB (ref 0.0–4.7)

## 2014-04-12 ENCOUNTER — Telehealth: Payer: Self-pay | Admitting: *Deleted

## 2014-04-12 NOTE — Telephone Encounter (Signed)
I talked with the patient. Appetite came back last week. Last BM was 03-30-14. Chemotherapy treatment is due tomorrow. Miralax daily (adjust according to stools) and increase water, pt agrees.

## 2014-04-12 NOTE — Telephone Encounter (Signed)
Pt is just started being constipated since everything has been going on, it started this morning and was wondering is there anything she can take? She is taking the same medications as she has been and nothing has changed with that

## 2014-04-13 LAB — CBC CANCER CENTER
Basophil #: 0 x10 3/mm (ref 0.0–0.1)
Basophil %: 1 %
EOS PCT: 1.1 %
Eosinophil #: 0 x10 3/mm (ref 0.0–0.7)
HCT: 34.5 % — ABNORMAL LOW (ref 35.0–47.0)
HGB: 11.4 g/dL — ABNORMAL LOW (ref 12.0–16.0)
LYMPHS PCT: 49.9 %
Lymphocyte #: 0.9 x10 3/mm — ABNORMAL LOW (ref 1.0–3.6)
MCH: 28 pg (ref 26.0–34.0)
MCHC: 33.1 g/dL (ref 32.0–36.0)
MCV: 85 fL (ref 80–100)
MONOS PCT: 23 %
Monocyte #: 0.4 x10 3/mm (ref 0.2–0.9)
NEUTROS ABS: 0.5 x10 3/mm — AB (ref 1.4–6.5)
NEUTROS PCT: 25 %
PLATELETS: 177 x10 3/mm (ref 150–440)
RBC: 4.07 10*6/uL (ref 3.80–5.20)
RDW: 24.6 % — ABNORMAL HIGH (ref 11.5–14.5)
WBC: 1.9 x10 3/mm — CL (ref 3.6–11.0)

## 2014-04-13 LAB — COMPREHENSIVE METABOLIC PANEL
ALBUMIN: 2.8 g/dL — AB (ref 3.4–5.0)
ANION GAP: 9 (ref 7–16)
Alkaline Phosphatase: 66 U/L
BUN: 5 mg/dL — ABNORMAL LOW (ref 7–18)
Bilirubin,Total: 0.6 mg/dL (ref 0.2–1.0)
CREATININE: 0.72 mg/dL (ref 0.60–1.30)
Calcium, Total: 9.2 mg/dL (ref 8.5–10.1)
Chloride: 106 mmol/L (ref 98–107)
Co2: 28 mmol/L (ref 21–32)
EGFR (African American): >60
GLUCOSE: 118 mg/dL — AB (ref 65–99)
Osmolality: 283 (ref 275–301)
Potassium: 3.3 mmol/L — ABNORMAL LOW (ref 3.5–5.1)
SGOT(AST): 14 U/L — ABNORMAL LOW (ref 15–37)
SGPT (ALT): 16 U/L (ref 12–78)
SODIUM: 143 mmol/L (ref 136–145)
Total Protein: 6.6 g/dL (ref 6.4–8.2)

## 2014-04-14 LAB — CEA: CEA: 6.3 ng/mL — AB (ref 0.0–4.7)

## 2014-04-20 LAB — CBC CANCER CENTER
Basophil #: 0 x10 3/mm (ref 0.0–0.1)
Basophil %: 0.8 %
Eosinophil #: 0 x10 3/mm (ref 0.0–0.7)
Eosinophil %: 0.9 %
HCT: 34.3 % — ABNORMAL LOW (ref 35.0–47.0)
HGB: 11.5 g/dL — AB (ref 12.0–16.0)
LYMPHS PCT: 49.3 %
Lymphocyte #: 2.7 x10 3/mm (ref 1.0–3.6)
MCH: 28 pg (ref 26.0–34.0)
MCHC: 33.6 g/dL (ref 32.0–36.0)
MCV: 84 fL (ref 80–100)
Monocyte #: 0.7 x10 3/mm (ref 0.2–0.9)
Monocyte %: 13.4 %
NEUTROS ABS: 2 x10 3/mm (ref 1.4–6.5)
Neutrophil %: 35.6 %
Platelet: 300 x10 3/mm (ref 150–440)
RBC: 4.11 10*6/uL (ref 3.80–5.20)
RDW: 23.1 % — ABNORMAL HIGH (ref 11.5–14.5)
WBC: 5.5 x10 3/mm (ref 3.6–11.0)

## 2014-04-22 LAB — HCG, QUANTITATIVE, PREGNANCY: Beta Hcg, Quant.: 1 m[IU]/mL

## 2014-04-26 ENCOUNTER — Ambulatory Visit: Payer: Self-pay | Admitting: Oncology

## 2014-05-04 LAB — CBC CANCER CENTER
BASOS ABS: 0.1 x10 3/mm (ref 0.0–0.1)
Basophil %: 0.9 %
Eosinophil #: 0.1 x10 3/mm (ref 0.0–0.7)
Eosinophil %: 1.3 %
HCT: 34.7 % — ABNORMAL LOW (ref 35.0–47.0)
HGB: 11.6 g/dL — ABNORMAL LOW (ref 12.0–16.0)
Lymphocyte #: 2.3 x10 3/mm (ref 1.0–3.6)
Lymphocyte %: 35.3 %
MCH: 29.7 pg (ref 26.0–34.0)
MCHC: 33.5 g/dL (ref 32.0–36.0)
MCV: 89 fL (ref 80–100)
MONO ABS: 0.6 x10 3/mm (ref 0.2–0.9)
Monocyte %: 9.7 %
Neutrophil #: 3.4 x10 3/mm (ref 1.4–6.5)
Neutrophil %: 52.8 %
Platelet: 179 x10 3/mm (ref 150–440)
RBC: 3.91 10*6/uL (ref 3.80–5.20)
RDW: 21.5 % — AB (ref 11.5–14.5)
WBC: 6.5 x10 3/mm (ref 3.6–11.0)

## 2014-05-04 LAB — MAGNESIUM: Magnesium: 1.9 mg/dL

## 2014-05-04 LAB — COMPREHENSIVE METABOLIC PANEL
ALT: 17 U/L (ref 12–78)
Albumin: 3 g/dL — ABNORMAL LOW (ref 3.4–5.0)
Alkaline Phosphatase: 98 U/L
Anion Gap: 9 (ref 7–16)
BUN: 5 mg/dL — ABNORMAL LOW (ref 7–18)
Bilirubin,Total: 0.2 mg/dL (ref 0.2–1.0)
CALCIUM: 9.1 mg/dL (ref 8.5–10.1)
CO2: 25 mmol/L (ref 21–32)
Chloride: 109 mmol/L — ABNORMAL HIGH (ref 98–107)
Creatinine: 0.65 mg/dL (ref 0.60–1.30)
EGFR (African American): >60
EGFR (Non-African Amer.): >60
Glucose: 87 mg/dL (ref 65–99)
OSMOLALITY: 282 (ref 275–301)
Potassium: 3.8 mmol/L (ref 3.5–5.1)
SGOT(AST): 27 U/L (ref 15–37)
Sodium: 143 mmol/L (ref 136–145)
TOTAL PROTEIN: 6.4 g/dL (ref 6.4–8.2)

## 2014-05-05 LAB — CEA: CEA: 5.5 ng/mL — AB (ref 0.0–4.7)

## 2014-05-18 LAB — COMPREHENSIVE METABOLIC PANEL
ALK PHOS: 66 U/L
Albumin: 3 g/dL — ABNORMAL LOW (ref 3.4–5.0)
Anion Gap: 9 (ref 7–16)
BILIRUBIN TOTAL: 0.5 mg/dL (ref 0.2–1.0)
BUN: 7 mg/dL (ref 7–18)
CALCIUM: 8.9 mg/dL (ref 8.5–10.1)
CHLORIDE: 108 mmol/L — AB (ref 98–107)
CREATININE: 0.64 mg/dL (ref 0.60–1.30)
Co2: 24 mmol/L (ref 21–32)
Glucose: 113 mg/dL — ABNORMAL HIGH (ref 65–99)
Osmolality: 280 (ref 275–301)
Potassium: 3.9 mmol/L (ref 3.5–5.1)
SGOT(AST): 23 U/L (ref 15–37)
SGPT (ALT): 24 U/L
Sodium: 141 mmol/L (ref 136–145)
Total Protein: 6.3 g/dL — ABNORMAL LOW (ref 6.4–8.2)

## 2014-05-18 LAB — CBC CANCER CENTER
BASOS ABS: 0 x10 3/mm (ref 0.0–0.1)
Basophil %: 1 %
EOS ABS: 0 x10 3/mm (ref 0.0–0.7)
EOS PCT: 0.8 %
HCT: 35.6 % (ref 35.0–47.0)
HGB: 11.6 g/dL — AB (ref 12.0–16.0)
Lymphocyte #: 1.4 x10 3/mm (ref 1.0–3.6)
Lymphocyte %: 37.7 %
MCH: 28.9 pg (ref 26.0–34.0)
MCHC: 32.6 g/dL (ref 32.0–36.0)
MCV: 89 fL (ref 80–100)
MONO ABS: 0.5 x10 3/mm (ref 0.2–0.9)
Monocyte %: 12.6 %
NEUTROS ABS: 1.7 x10 3/mm (ref 1.4–6.5)
Neutrophil %: 47.9 %
Platelet: 185 x10 3/mm (ref 150–440)
RBC: 4.02 10*6/uL (ref 3.80–5.20)
RDW: 16.7 % — ABNORMAL HIGH (ref 11.5–14.5)
WBC: 3.6 x10 3/mm (ref 3.6–11.0)

## 2014-05-27 ENCOUNTER — Ambulatory Visit: Payer: Self-pay | Admitting: Oncology

## 2014-06-01 LAB — CBC CANCER CENTER
BASOS ABS: 0 x10 3/mm (ref 0.0–0.1)
BASOS PCT: 0.8 %
EOS ABS: 0 x10 3/mm (ref 0.0–0.7)
EOS PCT: 1 %
HCT: 36.8 % (ref 35.0–47.0)
HGB: 12.2 g/dL (ref 12.0–16.0)
LYMPHS PCT: 32.2 %
Lymphocyte #: 1.5 x10 3/mm (ref 1.0–3.6)
MCH: 29.8 pg (ref 26.0–34.0)
MCHC: 33.2 g/dL (ref 32.0–36.0)
MCV: 90 fL (ref 80–100)
Monocyte #: 0.7 x10 3/mm (ref 0.2–0.9)
Monocyte %: 13.9 %
NEUTROS PCT: 52.1 %
Neutrophil #: 2.5 x10 3/mm (ref 1.4–6.5)
PLATELETS: 134 x10 3/mm — AB (ref 150–440)
RBC: 4.1 10*6/uL (ref 3.80–5.20)
RDW: 15.5 % — ABNORMAL HIGH (ref 11.5–14.5)
WBC: 4.8 x10 3/mm (ref 3.6–11.0)

## 2014-06-01 LAB — COMPREHENSIVE METABOLIC PANEL
ALBUMIN: 3.1 g/dL — AB (ref 3.4–5.0)
ALK PHOS: 76 U/L
Anion Gap: 6 — ABNORMAL LOW (ref 7–16)
BUN: 8 mg/dL (ref 7–18)
Bilirubin,Total: 0.5 mg/dL (ref 0.2–1.0)
CALCIUM: 9.1 mg/dL (ref 8.5–10.1)
Chloride: 107 mmol/L (ref 98–107)
Co2: 28 mmol/L (ref 21–32)
Creatinine: 0.65 mg/dL (ref 0.60–1.30)
Glucose: 99 mg/dL (ref 65–99)
OSMOLALITY: 280 (ref 275–301)
POTASSIUM: 3.9 mmol/L (ref 3.5–5.1)
SGOT(AST): 34 U/L (ref 15–37)
SGPT (ALT): 33 U/L
Sodium: 141 mmol/L (ref 136–145)
TOTAL PROTEIN: 6.4 g/dL (ref 6.4–8.2)

## 2014-06-03 LAB — CEA: CEA: 5.4 ng/mL — AB (ref 0.0–4.7)

## 2014-06-15 LAB — COMPREHENSIVE METABOLIC PANEL
ALK PHOS: 73 U/L
Albumin: 3.1 g/dL — ABNORMAL LOW (ref 3.4–5.0)
Anion Gap: 9 (ref 7–16)
BUN: 4 mg/dL — AB (ref 7–18)
Bilirubin,Total: 0.5 mg/dL (ref 0.2–1.0)
CO2: 26 mmol/L (ref 21–32)
CREATININE: 0.67 mg/dL (ref 0.60–1.30)
Calcium, Total: 8.6 mg/dL (ref 8.5–10.1)
Chloride: 108 mmol/L — ABNORMAL HIGH (ref 98–107)
EGFR (African American): >60
EGFR (Non-African Amer.): >60
Glucose: 101 mg/dL — ABNORMAL HIGH (ref 65–99)
OSMOLALITY: 282 (ref 275–301)
POTASSIUM: 3.7 mmol/L (ref 3.5–5.1)
SGOT(AST): 29 U/L (ref 15–37)
SGPT (ALT): 26 U/L
Sodium: 143 mmol/L (ref 136–145)
TOTAL PROTEIN: 6.5 g/dL (ref 6.4–8.2)

## 2014-06-15 LAB — CBC CANCER CENTER
Basophil #: 0 x10 3/mm (ref 0.0–0.1)
Basophil %: 0.8 %
EOS ABS: 0.1 x10 3/mm (ref 0.0–0.7)
Eosinophil %: 1.3 %
HCT: 37.2 % (ref 35.0–47.0)
HGB: 12.3 g/dL (ref 12.0–16.0)
LYMPHS ABS: 1.4 x10 3/mm (ref 1.0–3.6)
Lymphocyte %: 33.1 %
MCH: 30.3 pg (ref 26.0–34.0)
MCHC: 33.1 g/dL (ref 32.0–36.0)
MCV: 92 fL (ref 80–100)
MONOS PCT: 12.1 %
Monocyte #: 0.5 x10 3/mm (ref 0.2–0.9)
NEUTROS ABS: 2.2 x10 3/mm (ref 1.4–6.5)
NEUTROS PCT: 52.7 %
PLATELETS: 126 x10 3/mm — AB (ref 150–440)
RBC: 4.05 10*6/uL (ref 3.80–5.20)
RDW: 15.4 % — ABNORMAL HIGH (ref 11.5–14.5)
WBC: 4.2 x10 3/mm (ref 3.6–11.0)

## 2014-06-16 LAB — CEA: CEA: 5.3 ng/mL — ABNORMAL HIGH (ref 0.0–4.7)

## 2014-06-27 ENCOUNTER — Ambulatory Visit: Payer: Self-pay | Admitting: Oncology

## 2014-06-29 LAB — COMPREHENSIVE METABOLIC PANEL
ALBUMIN: 3.4 g/dL (ref 3.4–5.0)
AST: 39 U/L — AB (ref 15–37)
Alkaline Phosphatase: 77 U/L
Anion Gap: 13 (ref 7–16)
BILIRUBIN TOTAL: 0.5 mg/dL (ref 0.2–1.0)
BUN: 6 mg/dL — AB (ref 7–18)
CO2: 23 mmol/L (ref 21–32)
CREATININE: 0.77 mg/dL (ref 0.60–1.30)
Calcium, Total: 9.2 mg/dL (ref 8.5–10.1)
Chloride: 106 mmol/L (ref 98–107)
Glucose: 153 mg/dL — ABNORMAL HIGH (ref 65–99)
Osmolality: 284 (ref 275–301)
Potassium: 3.8 mmol/L (ref 3.5–5.1)
SGPT (ALT): 39 U/L
SODIUM: 142 mmol/L (ref 136–145)
TOTAL PROTEIN: 7.4 g/dL (ref 6.4–8.2)

## 2014-06-29 LAB — CBC CANCER CENTER
BASOS ABS: 0 x10 3/mm (ref 0.0–0.1)
Basophil %: 0 %
EOS ABS: 0 x10 3/mm (ref 0.0–0.7)
Eosinophil %: 0 %
HCT: 39.6 % (ref 35.0–47.0)
HGB: 13.2 g/dL (ref 12.0–16.0)
LYMPHS ABS: 0.7 x10 3/mm — AB (ref 1.0–3.6)
Lymphocyte %: 15 %
MCH: 30.6 pg (ref 26.0–34.0)
MCHC: 33.4 g/dL (ref 32.0–36.0)
MCV: 92 fL (ref 80–100)
MONO ABS: 0.1 x10 3/mm — AB (ref 0.2–0.9)
Monocyte %: 1.6 %
Neutrophil #: 4.1 x10 3/mm (ref 1.4–6.5)
Neutrophil %: 83.4 %
Platelet: 158 x10 3/mm (ref 150–440)
RBC: 4.33 10*6/uL (ref 3.80–5.20)
RDW: 15.5 % — ABNORMAL HIGH (ref 11.5–14.5)
WBC: 5 x10 3/mm (ref 3.6–11.0)

## 2014-06-29 LAB — MAGNESIUM: MAGNESIUM: 1.7 mg/dL — AB

## 2014-06-30 LAB — CEA: CEA: 5.3 ng/mL — ABNORMAL HIGH (ref 0.0–4.7)

## 2014-07-10 ENCOUNTER — Ambulatory Visit: Payer: Self-pay | Admitting: Oncology

## 2014-07-13 LAB — COMPREHENSIVE METABOLIC PANEL
ALK PHOS: 78 U/L
ANION GAP: 10 (ref 7–16)
AST: 27 U/L (ref 15–37)
Albumin: 3.1 g/dL — ABNORMAL LOW (ref 3.4–5.0)
BUN: 6 mg/dL — ABNORMAL LOW (ref 7–18)
Bilirubin,Total: 0.3 mg/dL (ref 0.2–1.0)
CHLORIDE: 106 mmol/L (ref 98–107)
CREATININE: 0.73 mg/dL (ref 0.60–1.30)
Calcium, Total: 8.8 mg/dL (ref 8.5–10.1)
Co2: 25 mmol/L (ref 21–32)
EGFR (African American): >60
Glucose: 70 mg/dL (ref 65–99)
OSMOLALITY: 277 (ref 275–301)
POTASSIUM: 3.5 mmol/L (ref 3.5–5.1)
SGPT (ALT): 32 U/L
Sodium: 141 mmol/L (ref 136–145)
TOTAL PROTEIN: 6.5 g/dL (ref 6.4–8.2)

## 2014-07-13 LAB — CBC CANCER CENTER
BASOS ABS: 0 x10 3/mm (ref 0.0–0.1)
Basophil %: 0.7 %
EOS ABS: 0.1 x10 3/mm (ref 0.0–0.7)
EOS PCT: 1.2 %
HCT: 37.3 % (ref 35.0–47.0)
HGB: 12.4 g/dL (ref 12.0–16.0)
Lymphocyte #: 1.6 x10 3/mm (ref 1.0–3.6)
Lymphocyte %: 34.8 %
MCH: 30.4 pg (ref 26.0–34.0)
MCHC: 33.1 g/dL (ref 32.0–36.0)
MCV: 92 fL (ref 80–100)
MONO ABS: 0.6 x10 3/mm (ref 0.2–0.9)
Monocyte %: 12.6 %
Neutrophil #: 2.3 x10 3/mm (ref 1.4–6.5)
Neutrophil %: 50.7 %
Platelet: 167 x10 3/mm (ref 150–440)
RBC: 4.07 10*6/uL (ref 3.80–5.20)
RDW: 15.7 % — AB (ref 11.5–14.5)
WBC: 4.5 x10 3/mm (ref 3.6–11.0)

## 2014-07-13 LAB — MAGNESIUM: Magnesium: 1.8 mg/dL

## 2014-07-17 LAB — CEA: CEA: 4.8 ng/mL — AB (ref 0.0–4.7)

## 2014-07-25 ENCOUNTER — Emergency Department: Payer: Self-pay | Admitting: Internal Medicine

## 2014-07-26 DIAGNOSIS — C189 Malignant neoplasm of colon, unspecified: Secondary | ICD-10-CM

## 2014-07-27 ENCOUNTER — Ambulatory Visit: Payer: Self-pay | Admitting: Oncology

## 2014-07-27 LAB — COMPREHENSIVE METABOLIC PANEL
ALK PHOS: 69 U/L
ALT: 38 U/L
ANION GAP: 7 (ref 7–16)
Albumin: 3.2 g/dL — ABNORMAL LOW (ref 3.4–5.0)
BUN: 6 mg/dL — AB (ref 7–18)
Bilirubin,Total: 0.3 mg/dL (ref 0.2–1.0)
CALCIUM: 8.8 mg/dL (ref 8.5–10.1)
CO2: 26 mmol/L (ref 21–32)
Chloride: 109 mmol/L — ABNORMAL HIGH (ref 98–107)
Creatinine: 0.56 mg/dL — ABNORMAL LOW (ref 0.60–1.30)
EGFR (African American): >60
GLUCOSE: 96 mg/dL (ref 65–99)
Osmolality: 281 (ref 275–301)
POTASSIUM: 3.6 mmol/L (ref 3.5–5.1)
SGOT(AST): 31 U/L (ref 15–37)
Sodium: 142 mmol/L (ref 136–145)
Total Protein: 6.2 g/dL — ABNORMAL LOW (ref 6.4–8.2)

## 2014-07-27 LAB — CBC CANCER CENTER
BASOS PCT: 0.7 %
Basophil #: 0 x10 3/mm (ref 0.0–0.1)
EOS ABS: 0 x10 3/mm (ref 0.0–0.7)
Eosinophil %: 1.1 %
HCT: 36.9 % (ref 35.0–47.0)
HGB: 12.3 g/dL (ref 12.0–16.0)
Lymphocyte #: 1.5 x10 3/mm (ref 1.0–3.6)
Lymphocyte %: 42.6 %
MCH: 30.3 pg (ref 26.0–34.0)
MCHC: 33.3 g/dL (ref 32.0–36.0)
MCV: 91 fL (ref 80–100)
MONO ABS: 0.4 x10 3/mm (ref 0.2–0.9)
Monocyte %: 11.8 %
NEUTROS ABS: 1.6 x10 3/mm (ref 1.4–6.5)
NEUTROS PCT: 43.8 %
PLATELETS: 161 x10 3/mm (ref 150–440)
RBC: 4.04 10*6/uL (ref 3.80–5.20)
RDW: 16 % — ABNORMAL HIGH (ref 11.5–14.5)
WBC: 3.6 x10 3/mm (ref 3.6–11.0)

## 2014-07-27 LAB — MAGNESIUM: MAGNESIUM: 1.8 mg/dL

## 2014-07-28 LAB — CEA: CEA: 6 ng/mL — ABNORMAL HIGH (ref 0.0–4.7)

## 2014-08-10 LAB — COMPREHENSIVE METABOLIC PANEL
ALBUMIN: 3.3 g/dL — AB (ref 3.4–5.0)
ALT: 32 U/L
Alkaline Phosphatase: 66 U/L
Anion Gap: 6 — ABNORMAL LOW (ref 7–16)
BILIRUBIN TOTAL: 0.4 mg/dL (ref 0.2–1.0)
BUN: 6 mg/dL — AB (ref 7–18)
CO2: 26 mmol/L (ref 21–32)
CREATININE: 0.59 mg/dL — AB (ref 0.60–1.30)
Calcium, Total: 8.9 mg/dL (ref 8.5–10.1)
Chloride: 109 mmol/L — ABNORMAL HIGH (ref 98–107)
EGFR (African American): >60
GLUCOSE: 94 mg/dL (ref 65–99)
OSMOLALITY: 279 (ref 275–301)
Potassium: 3.8 mmol/L (ref 3.5–5.1)
SGOT(AST): 23 U/L (ref 15–37)
SODIUM: 141 mmol/L (ref 136–145)
Total Protein: 6.5 g/dL (ref 6.4–8.2)

## 2014-08-10 LAB — CBC CANCER CENTER
BASOS ABS: 0 x10 3/mm (ref 0.0–0.1)
Basophil %: 0.9 %
EOS PCT: 1 %
Eosinophil #: 0 x10 3/mm (ref 0.0–0.7)
HCT: 37.5 % (ref 35.0–47.0)
HGB: 12.3 g/dL (ref 12.0–16.0)
LYMPHS ABS: 1.5 x10 3/mm (ref 1.0–3.6)
LYMPHS PCT: 39.9 %
MCH: 30.3 pg (ref 26.0–34.0)
MCHC: 32.9 g/dL (ref 32.0–36.0)
MCV: 92 fL (ref 80–100)
MONOS PCT: 12.9 %
Monocyte #: 0.5 x10 3/mm (ref 0.2–0.9)
Neutrophil #: 1.7 x10 3/mm (ref 1.4–6.5)
Neutrophil %: 45.3 %
Platelet: 155 x10 3/mm (ref 150–440)
RBC: 4.07 10*6/uL (ref 3.80–5.20)
RDW: 16 % — ABNORMAL HIGH (ref 11.5–14.5)
WBC: 3.7 x10 3/mm (ref 3.6–11.0)

## 2014-08-10 LAB — MAGNESIUM: MAGNESIUM: 2 mg/dL

## 2014-08-24 LAB — CBC CANCER CENTER
BASOS ABS: 0 x10 3/mm (ref 0.0–0.1)
BASOS PCT: 0.7 %
EOS ABS: 0 x10 3/mm (ref 0.0–0.7)
Eosinophil %: 1.1 %
HCT: 36.9 % (ref 35.0–47.0)
HGB: 12.2 g/dL (ref 12.0–16.0)
LYMPHS PCT: 39.9 %
Lymphocyte #: 1.5 x10 3/mm (ref 1.0–3.6)
MCH: 30.6 pg (ref 26.0–34.0)
MCHC: 33 g/dL (ref 32.0–36.0)
MCV: 93 fL (ref 80–100)
MONOS PCT: 12.9 %
Monocyte #: 0.5 x10 3/mm (ref 0.2–0.9)
NEUTROS ABS: 1.8 x10 3/mm (ref 1.4–6.5)
Neutrophil %: 45.4 %
PLATELETS: 159 x10 3/mm (ref 150–440)
RBC: 3.98 10*6/uL (ref 3.80–5.20)
RDW: 16.1 % — AB (ref 11.5–14.5)
WBC: 3.9 x10 3/mm (ref 3.6–11.0)

## 2014-08-24 LAB — COMPREHENSIVE METABOLIC PANEL
ALBUMIN: 3.2 g/dL — AB (ref 3.4–5.0)
ANION GAP: 7 (ref 7–16)
Alkaline Phosphatase: 75 U/L
BILIRUBIN TOTAL: 0.4 mg/dL (ref 0.2–1.0)
BUN: 5 mg/dL — AB (ref 7–18)
CHLORIDE: 110 mmol/L — AB (ref 98–107)
CO2: 25 mmol/L (ref 21–32)
Calcium, Total: 8.7 mg/dL (ref 8.5–10.1)
Creatinine: 0.59 mg/dL — ABNORMAL LOW (ref 0.60–1.30)
EGFR (Non-African Amer.): >60
Glucose: 87 mg/dL (ref 65–99)
OSMOLALITY: 280 (ref 275–301)
Potassium: 3.9 mmol/L (ref 3.5–5.1)
SGOT(AST): 24 U/L (ref 15–37)
SGPT (ALT): 29 U/L
SODIUM: 142 mmol/L (ref 136–145)
Total Protein: 6.4 g/dL (ref 6.4–8.2)

## 2014-08-24 LAB — MAGNESIUM: Magnesium: 1.9 mg/dL

## 2014-08-27 ENCOUNTER — Ambulatory Visit: Payer: Self-pay | Admitting: Oncology

## 2014-08-28 ENCOUNTER — Encounter: Payer: Self-pay | Admitting: General Surgery

## 2014-09-07 LAB — COMPREHENSIVE METABOLIC PANEL
ALT: 27 U/L
Albumin: 3.2 g/dL — ABNORMAL LOW (ref 3.4–5.0)
Alkaline Phosphatase: 70 U/L
Anion Gap: 8 (ref 7–16)
BUN: 6 mg/dL — ABNORMAL LOW (ref 7–18)
Bilirubin,Total: 0.4 mg/dL (ref 0.2–1.0)
CALCIUM: 8.8 mg/dL (ref 8.5–10.1)
CREATININE: 0.57 mg/dL — AB (ref 0.60–1.30)
Chloride: 110 mmol/L — ABNORMAL HIGH (ref 98–107)
Co2: 26 mmol/L (ref 21–32)
EGFR (Non-African Amer.): >60
Glucose: 86 mg/dL (ref 65–99)
OSMOLALITY: 284 (ref 275–301)
Potassium: 3.7 mmol/L (ref 3.5–5.1)
SGOT(AST): 24 U/L (ref 15–37)
SODIUM: 144 mmol/L (ref 136–145)
Total Protein: 6.3 g/dL — ABNORMAL LOW (ref 6.4–8.2)

## 2014-09-07 LAB — CBC CANCER CENTER
BASOS ABS: 0 x10 3/mm (ref 0.0–0.1)
Basophil %: 0.8 %
EOS ABS: 0 x10 3/mm (ref 0.0–0.7)
Eosinophil %: 0.9 %
HCT: 36.9 % (ref 35.0–47.0)
HGB: 12.6 g/dL (ref 12.0–16.0)
LYMPHS ABS: 1.5 x10 3/mm (ref 1.0–3.6)
Lymphocyte %: 33.6 %
MCH: 31.1 pg (ref 26.0–34.0)
MCHC: 34 g/dL (ref 32.0–36.0)
MCV: 91 fL (ref 80–100)
MONO ABS: 0.5 x10 3/mm (ref 0.2–0.9)
Monocyte %: 10.3 %
Neutrophil #: 2.5 x10 3/mm (ref 1.4–6.5)
Neutrophil %: 54.4 %
Platelet: 174 x10 3/mm (ref 150–440)
RBC: 4.04 10*6/uL (ref 3.80–5.20)
RDW: 16.2 % — ABNORMAL HIGH (ref 11.5–14.5)
WBC: 4.5 x10 3/mm (ref 3.6–11.0)

## 2014-09-07 LAB — MAGNESIUM: MAGNESIUM: 1.9 mg/dL

## 2014-09-26 ENCOUNTER — Ambulatory Visit: Payer: Self-pay | Admitting: Oncology

## 2014-09-28 LAB — COMPREHENSIVE METABOLIC PANEL
Albumin: 3.5 g/dL (ref 3.4–5.0)
Alkaline Phosphatase: 66 U/L
Anion Gap: 9 (ref 7–16)
BUN: 9 mg/dL (ref 7–18)
Bilirubin,Total: 0.6 mg/dL (ref 0.2–1.0)
Calcium, Total: 9.1 mg/dL (ref 8.5–10.1)
Chloride: 106 mmol/L (ref 98–107)
Co2: 26 mmol/L (ref 21–32)
Creatinine: 0.62 mg/dL (ref 0.60–1.30)
EGFR (African American): >60
EGFR (Non-African Amer.): >60
Glucose: 89 mg/dL (ref 65–99)
Osmolality: 279 (ref 275–301)
Potassium: 3.8 mmol/L (ref 3.5–5.1)
SGOT(AST): 21 U/L (ref 15–37)
SGPT (ALT): 28 U/L
Sodium: 141 mmol/L (ref 136–145)
Total Protein: 6.9 g/dL (ref 6.4–8.2)

## 2014-09-28 LAB — CBC CANCER CENTER
BASOS ABS: 0 x10 3/mm (ref 0.0–0.1)
Basophil %: 0.8 %
Eosinophil #: 0.1 x10 3/mm (ref 0.0–0.7)
Eosinophil %: 2.5 %
HCT: 40.9 % (ref 35.0–47.0)
HGB: 13.6 g/dL (ref 12.0–16.0)
Lymphocyte #: 1.6 x10 3/mm (ref 1.0–3.6)
Lymphocyte %: 37.5 %
MCH: 30.5 pg (ref 26.0–34.0)
MCHC: 33.2 g/dL (ref 32.0–36.0)
MCV: 92 fL (ref 80–100)
Monocyte #: 0.6 x10 3/mm (ref 0.2–0.9)
Monocyte %: 13.7 %
NEUTROS PCT: 45.5 %
Neutrophil #: 1.9 x10 3/mm (ref 1.4–6.5)
Platelet: 211 x10 3/mm (ref 150–440)
RBC: 4.45 10*6/uL (ref 3.80–5.20)
RDW: 16 % — AB (ref 11.5–14.5)
WBC: 4.3 x10 3/mm (ref 3.6–11.0)

## 2014-09-28 LAB — MAGNESIUM: Magnesium: 1.9 mg/dL

## 2014-09-29 LAB — CEA: CEA: 3.5 ng/mL (ref 0.0–4.7)

## 2014-10-12 LAB — CBC CANCER CENTER
BASOS ABS: 0 x10 3/mm (ref 0.0–0.1)
Basophil %: 0.9 %
Eosinophil #: 0.1 x10 3/mm (ref 0.0–0.7)
Eosinophil %: 1.7 %
HCT: 40.2 % (ref 35.0–47.0)
HGB: 13.3 g/dL (ref 12.0–16.0)
Lymphocyte #: 1.8 x10 3/mm (ref 1.0–3.6)
Lymphocyte %: 31.2 %
MCH: 30.7 pg (ref 26.0–34.0)
MCHC: 33.2 g/dL (ref 32.0–36.0)
MCV: 93 fL (ref 80–100)
Monocyte #: 0.6 x10 3/mm (ref 0.2–0.9)
Monocyte %: 10.7 %
Neutrophil #: 3.1 x10 3/mm (ref 1.4–6.5)
Neutrophil %: 55.5 %
Platelet: 187 x10 3/mm (ref 150–440)
RBC: 4.35 10*6/uL (ref 3.80–5.20)
RDW: 15.6 % — AB (ref 11.5–14.5)
WBC: 5.6 x10 3/mm (ref 3.6–11.0)

## 2014-10-27 ENCOUNTER — Ambulatory Visit: Payer: Self-pay | Admitting: Oncology

## 2014-10-31 LAB — COMPREHENSIVE METABOLIC PANEL
ALBUMIN: 3.5 g/dL (ref 3.4–5.0)
AST: 15 U/L (ref 15–37)
Alkaline Phosphatase: 69 U/L
Anion Gap: 9 (ref 7–16)
BUN: 8 mg/dL (ref 7–18)
Bilirubin,Total: 0.4 mg/dL (ref 0.2–1.0)
CO2: 26 mmol/L (ref 21–32)
Calcium, Total: 8.4 mg/dL — ABNORMAL LOW (ref 8.5–10.1)
Chloride: 109 mmol/L — ABNORMAL HIGH (ref 98–107)
Creatinine: 0.72 mg/dL (ref 0.60–1.30)
EGFR (Non-African Amer.): >60
GLUCOSE: 121 mg/dL — AB (ref 65–99)
Osmolality: 286 (ref 275–301)
Potassium: 3.6 mmol/L (ref 3.5–5.1)
SGPT (ALT): 19 U/L
Sodium: 144 mmol/L (ref 136–145)
Total Protein: 6.9 g/dL (ref 6.4–8.2)

## 2014-10-31 LAB — CBC CANCER CENTER
Basophil #: 0 x10 3/mm (ref 0.0–0.1)
Basophil %: 0.8 %
EOS ABS: 0.1 x10 3/mm (ref 0.0–0.7)
Eosinophil %: 1.9 %
HCT: 41.3 % (ref 35.0–47.0)
HGB: 13.8 g/dL (ref 12.0–16.0)
LYMPHS PCT: 29.7 %
Lymphocyte #: 1.4 x10 3/mm (ref 1.0–3.6)
MCH: 30.8 pg (ref 26.0–34.0)
MCHC: 33.5 g/dL (ref 32.0–36.0)
MCV: 92 fL (ref 80–100)
Monocyte #: 0.5 x10 3/mm (ref 0.2–0.9)
Monocyte %: 10.2 %
Neutrophil #: 2.8 x10 3/mm (ref 1.4–6.5)
Neutrophil %: 57.4 %
PLATELETS: 132 x10 3/mm — AB (ref 150–440)
RBC: 4.49 10*6/uL (ref 3.80–5.20)
RDW: 15.1 % — AB (ref 11.5–14.5)
WBC: 4.8 x10 3/mm (ref 3.6–11.0)

## 2014-11-01 LAB — CEA: CEA: 3.6 ng/mL (ref 0.0–4.7)

## 2014-11-16 LAB — CBC CANCER CENTER
BASOS ABS: 0 x10 3/mm (ref 0.0–0.1)
BASOS PCT: 0.2 %
EOS ABS: 0.1 x10 3/mm (ref 0.0–0.7)
Eosinophil %: 1.5 %
HCT: 40.8 % (ref 35.0–47.0)
HGB: 13.7 g/dL (ref 12.0–16.0)
LYMPHS PCT: 17.6 %
Lymphocyte #: 1.3 x10 3/mm (ref 1.0–3.6)
MCH: 30.4 pg (ref 26.0–34.0)
MCHC: 33.6 g/dL (ref 32.0–36.0)
MCV: 91 fL (ref 80–100)
MONOS PCT: 11 %
Monocyte #: 0.8 x10 3/mm (ref 0.2–0.9)
NEUTROS ABS: 5 x10 3/mm (ref 1.4–6.5)
NEUTROS PCT: 69.7 %
Platelet: 159 x10 3/mm (ref 150–440)
RBC: 4.5 10*6/uL (ref 3.80–5.20)
RDW: 14.6 % — ABNORMAL HIGH (ref 11.5–14.5)
WBC: 7.2 x10 3/mm (ref 3.6–11.0)

## 2014-11-16 LAB — COMPREHENSIVE METABOLIC PANEL
ALK PHOS: 73 U/L
Albumin: 3.3 g/dL — ABNORMAL LOW (ref 3.4–5.0)
Anion Gap: 8 (ref 7–16)
BUN: 7 mg/dL (ref 7–18)
Bilirubin,Total: 0.4 mg/dL (ref 0.2–1.0)
CALCIUM: 8.6 mg/dL (ref 8.5–10.1)
CHLORIDE: 106 mmol/L (ref 98–107)
CO2: 27 mmol/L (ref 21–32)
CREATININE: 0.69 mg/dL (ref 0.60–1.30)
EGFR (African American): >60
Glucose: 94 mg/dL (ref 65–99)
OSMOLALITY: 279 (ref 275–301)
Potassium: 3.7 mmol/L (ref 3.5–5.1)
SGOT(AST): 15 U/L (ref 15–37)
SGPT (ALT): 18 U/L
SODIUM: 141 mmol/L (ref 136–145)
Total Protein: 6.9 g/dL (ref 6.4–8.2)

## 2014-11-16 LAB — MAGNESIUM: MAGNESIUM: 1.5 mg/dL — AB

## 2014-11-17 LAB — CEA: CEA: 3.3 ng/mL (ref 0.0–4.7)

## 2014-11-21 LAB — CBC CANCER CENTER
BASOS ABS: 0 x10 3/mm (ref 0.0–0.1)
Basophil %: 0.3 %
Eosinophil #: 0.1 x10 3/mm (ref 0.0–0.7)
Eosinophil %: 0.7 %
HCT: 41.8 % (ref 35.0–47.0)
HGB: 14.3 g/dL (ref 12.0–16.0)
Lymphocyte #: 1.1 x10 3/mm (ref 1.0–3.6)
Lymphocyte %: 12.9 %
MCH: 30.8 pg (ref 26.0–34.0)
MCHC: 34.2 g/dL (ref 32.0–36.0)
MCV: 90 fL (ref 80–100)
MONO ABS: 0.5 x10 3/mm (ref 0.2–0.9)
MONOS PCT: 6.3 %
Neutrophil #: 6.7 x10 3/mm — ABNORMAL HIGH (ref 1.4–6.5)
Neutrophil %: 79.8 %
PLATELETS: 167 x10 3/mm (ref 150–440)
RBC: 4.65 10*6/uL (ref 3.80–5.20)
RDW: 14.1 % (ref 11.5–14.5)
WBC: 8.4 x10 3/mm (ref 3.6–11.0)

## 2014-11-21 LAB — COMPREHENSIVE METABOLIC PANEL
ALBUMIN: 3 g/dL — AB (ref 3.4–5.0)
Alkaline Phosphatase: 69 U/L (ref 46–116)
Anion Gap: 8 (ref 7–16)
BILIRUBIN TOTAL: 1 mg/dL (ref 0.2–1.0)
BUN: 8 mg/dL (ref 7–18)
CALCIUM: 8.3 mg/dL — AB (ref 8.5–10.1)
Chloride: 100 mmol/L (ref 98–107)
Co2: 29 mmol/L (ref 21–32)
Creatinine: 0.97 mg/dL (ref 0.60–1.30)
EGFR (African American): >60
EGFR (Non-African Amer.): >60
Glucose: 116 mg/dL — ABNORMAL HIGH (ref 65–99)
OSMOLALITY: 273 (ref 275–301)
POTASSIUM: 3.4 mmol/L — AB (ref 3.5–5.1)
SGOT(AST): 15 U/L (ref 15–37)
SGPT (ALT): 19 U/L (ref 14–63)
SODIUM: 137 mmol/L (ref 136–145)
TOTAL PROTEIN: 7.1 g/dL (ref 6.4–8.2)

## 2014-11-25 ENCOUNTER — Encounter: Payer: Self-pay | Admitting: General Surgery

## 2014-11-25 NOTE — Progress Notes (Signed)
Medical oncology notes of 11/16/2014 reviewed.

## 2014-11-27 ENCOUNTER — Ambulatory Visit: Payer: Self-pay | Admitting: Oncology

## 2014-11-30 LAB — CBC CANCER CENTER
Basophil #: 0 x10 3/mm (ref 0.0–0.1)
Basophil %: 1.1 %
EOS ABS: 0.1 x10 3/mm (ref 0.0–0.7)
EOS PCT: 1.7 %
HCT: 37.6 % (ref 35.0–47.0)
HGB: 12.8 g/dL (ref 12.0–16.0)
Lymphocyte #: 1.2 x10 3/mm (ref 1.0–3.6)
Lymphocyte %: 24.7 %
MCH: 30.2 pg (ref 26.0–34.0)
MCHC: 33.9 g/dL (ref 32.0–36.0)
MCV: 89 fL (ref 80–100)
MONO ABS: 0.4 x10 3/mm (ref 0.2–0.9)
Monocyte %: 9.1 %
NEUTROS ABS: 3 x10 3/mm (ref 1.4–6.5)
Neutrophil %: 63.4 %
Platelet: 198 x10 3/mm (ref 150–440)
RBC: 4.23 10*6/uL (ref 3.80–5.20)
RDW: 14.1 % (ref 11.5–14.5)
WBC: 4.7 x10 3/mm (ref 3.6–11.0)

## 2014-11-30 LAB — MAGNESIUM: Magnesium: 1.9 mg/dL

## 2014-12-26 ENCOUNTER — Ambulatory Visit
Admit: 2014-12-26 | Disposition: A | Discharge status: Home / Self Care | Payer: Self-pay | Attending: Oncology | Admitting: Oncology

## 2015-01-11 LAB — CREATININE, SERUM: CREATINE, SERUM: 0.58

## 2015-01-23 ENCOUNTER — Ambulatory Visit: Admit: 2015-01-23 | Disposition: A | Discharge status: Home / Self Care | Payer: Self-pay | Admitting: Oncology

## 2015-01-25 LAB — CBC CANCER CENTER
BASOS ABS: 0 x10 3/mm (ref 0.0–0.1)
Basophil %: 0.9 %
EOS ABS: 0.1 x10 3/mm (ref 0.0–0.7)
Eosinophil %: 1.7 %
HCT: 39.9 % (ref 35.0–47.0)
HGB: 13.5 g/dL (ref 12.0–16.0)
LYMPHS ABS: 1.7 x10 3/mm (ref 1.0–3.6)
LYMPHS PCT: 33.8 %
MCH: 30.9 pg (ref 26.0–34.0)
MCHC: 33.8 g/dL (ref 32.0–36.0)
MCV: 91 fL (ref 80–100)
Monocyte #: 0.6 x10 3/mm (ref 0.2–0.9)
Monocyte %: 10.8 %
NEUTROS ABS: 2.7 x10 3/mm (ref 1.4–6.5)
Neutrophil %: 52.8 %
PLATELETS: 154 x10 3/mm (ref 150–440)
RBC: 4.37 10*6/uL (ref 3.80–5.20)
RDW: 15.5 % — AB (ref 11.5–14.5)
WBC: 5.1 x10 3/mm (ref 3.6–11.0)

## 2015-01-25 LAB — COMPREHENSIVE METABOLIC PANEL
ALT: 14 U/L
AST: 20 U/L
Albumin: 3.6 g/dL
Alkaline Phosphatase: 56 U/L
Anion Gap: 3 — ABNORMAL LOW (ref 7–16)
BUN: 10 mg/dL
Bilirubin,Total: 0.6 mg/dL
CALCIUM: 9 mg/dL
CO2: 25 mmol/L
Chloride: 113 mmol/L — ABNORMAL HIGH
Creatinine: 0.6 mg/dL
Glucose: 106 mg/dL — ABNORMAL HIGH
Potassium: 3.7 mmol/L
Sodium: 141 mmol/L
Total Protein: 6.8 g/dL

## 2015-01-25 LAB — MAGNESIUM: MAGNESIUM: 2.2 mg/dL

## 2015-01-26 ENCOUNTER — Ambulatory Visit
Admit: 2015-01-26 | Disposition: A | Discharge status: Home / Self Care | Payer: Self-pay | Attending: Oncology | Admitting: Oncology

## 2015-01-26 LAB — CEA: CEA: 4.8 ng/mL — AB (ref 0.0–4.7)

## 2015-02-16 NOTE — Op Note (Signed)
PATIENT NAME:  [name], Misty DANIELSEN MR#:  [id number] DATE OF BIRTH:  [birth year]  DATE OF PROCEDURE:  04/28/2013  PREOPERATIVE DIAGNOSIS:  Symptomatic cholelithiasis.   POSTOPERATIVE DIAGNOSES: 1.  Symptomatic cholelithiasis. 2.  Chronic cholecystitis.   ESTIMATED BLOOD LOSS:  50 mL.   COMPLICATIONS:  None.   SPECIMENS:  Gallbladder.   ANESTHESIA:  General.  INDICATION FOR SURGERY:  [name] is a pleasant 44 year old female who presents with history of recurrent right upper quadrant pain with fatty food ingestion. She was noted to have gallstones on ultrasound. She was thus brought to the Operating Room suite for laparoscopic cholecystectomy.   DETAILS OF PROCEDURE:  Informed consent was obtained. [name] was brought to the Operating Room suite. She was induced. Endotracheal tube was placed. General anesthesia was administered. Her abdomen was then prepped and draped in the standard surgical fashion. A timeout was then performed correctly identifying the patient's name, operative site and procedure to be performed. A supraumbilical incision was made. This was deepened down to the fascia. The fascia was incised. The peritoneum was entered. Two stay sutures were placed through the fascia and a Hasson trocar was placed into the abdomen. The abdomen was insufflated.  An 11 mm epigastric and two 5 mm subcostal trocars at the right costal margin at the midclavicular and anterior axillary line were placed. The gallbladder was then retracted over the dome of the liver. The gallbladder was difficult to grasp, likely due to chronic cholecystitis and there was a large stone in the infundibulum. The infundibulum of the gallbladder was grasped and retracted laterally and the cystic duct and cystic artery were dissected out. Carefully, the critical view was obtained. Cystic duct and cystic artery were then ligated. The gallbladder was then taken off the gallbladder fossa using Bovie electrocautery. There  was a small posterior cystic artery branch, which needed to be clipped as well. The gallbladder was then taken off the fascia and taken out with an Endo Catch bag through the supraumbilical port. The abdomen was then irrigated and the gallbladder fossa was made hemostatic. Two pieces of Surgicel were used to provide hemostasis. The trocars were then taken out under direct visualization.  The periumbilical fasciotomy was then closed with a 0 Vicryl figure-of-eight. All skin sites were then closed with inverted 4-0 Monocryl deep dermal sutures and Dermabond was then placed over all the incisions. The drapes were then taken down.  [name] is awakened and extubated and brought to postanesthesia care unit. There were no immediate complications. Needle, sponge and instrument count was correct at the end of the procedure.    ____________________________ Glena Norfolk. Kaziyah Parkison, MD cal:ce D: 04/28/2013 10:41:29 ET T: 04/28/2013 11:17:58 ET JOB#: [id number]  cc: Harrell Gave A. Enoch Moffa, MD, <Dictator> Floyde Parkins MD ELECTRONICALLY SIGNED 04/28/2013 19:48

## 2015-02-17 NOTE — Discharge Summary (Signed)
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   CLINICAL NOTE: This 44 year old woman had a 30-pound weight loss and a history of blood per rectum. Colonoscopy showed an obstructing tumor at the distal transverse colon. She was felt to be a candidate for resection. Preoperative imaging had suggested a mass in the distal body of the pancreas. No evidence of liver metastases. CEA was, however, elevated at just under 10.   HOSPITAL COURSE: The patient underwent a laparoscopically-assisted resection of the splenic flexure. She was found to have microperforation of the tumor up to the diaphragm and into the transverse colon mesentery. A small rent in the diaphragm was repaired intraoperatively, and she showed no pulmonary compromise from this repair. The biopsy of the pancreas was normal. Palpation in the operating room showed a nodular area in the left lobe of the liver, and biopsy has shown evidence of metastatic adenocarcinoma consistent with a colon primary.   The patient's postoperative course was unremarkable. She ambulated early and often. She had gradual return of GI function, and her diet was appropriately advanced. At the time of discharge, she showed a clear cardiopulmonary exam, unremarkable abdominal exam and had been essentially pain-free. There was a modest decrease in her hemoglobin from 8.3 to 7.6. She was asymptomatic and showed normal pressure and no postural changes. She was not felt to be a candidate for transfusion due to its immunosuppressive effect.   At the time of discharge, she was to resume Tandem 162/115.2 daily, potassium chloride 10 mEq b.i.d., Protonix 40 mg b.i.d., Tylenol 325 p.r.n. and Tylenol with Codeine if needed for pain. Home-going instructions were provided.   Arrangements were made for  followup in my office in 3 days for staple removal and physician exam in 8 days.   ____________________________ Robert Bellow, MD jwb:lb D: 03/09/2014 08:54:48 ET T: 03/09/2014 09:08:43 ET JOB#: [id number]  cc: Robert Bellow, MD, <Dictator> Martie Lee. [place]oksi, MD L. Bernie Covey, MD Lupita Dawn. Candace Cruise, MD Eldine Rencher Amedeo Kinsman MD ELECTRONICALLY SIGNED 03/09/2014 12:43

## 2015-02-17 NOTE — Op Note (Signed)
PATIENT NAME:  Misty Mack, Misty Mack MR#:  [id number] DATE OF BIRTH:  05-08-1971  DATE OF PROCEDURE:  03/13/2014  PREOPERATIVE DIAGNOSIS: Metastatic colon cancer, need for central venous access.   POSTOPERATIVE DIAGNOSIS:  Metastatic colon cancer, need for central venous access.  OPERATIVE PROCEDURE:  Left subclavian PowerPort placement with ultrasound guidance.   SURGEON: Hervey Ard, M.D.   ANESTHESIA: Attended local with Dr. Benjamine Mola, 10 mL of 1% plain Xylocaine.   ESTIMATED BLOOD LOSS: Minimal.   CLINICAL NOTE: This 44 year old woman has been noted to have metastatic colon cancer and central venous access that has been requested by her treating oncologist. She has previously undergone resection of the splenic flexure for near obstructing tumor. The patient does report that she is having early satiety and did have an episode of vomiting yesterday.   Clinical examination the morning of the procedure showed the abdomen to be nondistended. Well-healed midline wound. No focal mass or tenderness.   The patient was brought to the operating room and under sedation, the left chest was prepped with ChloraPrep and draped. Under ultrasound guidance, the subclavian vein was cannulated, followed by passage of the guidewire and catheter system. This was placed at the junction of the SVC and right atrium. It was then tunneled to a pocket on the left anterior chest wall. The port was anchored with two-point fixation to the deep tissue with 3-0 Prolene suture. The catheter easily irrigated and aspirated with the patient in the supine position. The adipose layer was closed with a running 3-0 Vicryl and the skin closed with a running 4-0 Vicryl subcuticular suture. Benzoin, Steri-Strips, Telfa and Tegaderm dressing was then applied.   Erect portable chest x-ray obtained in the recovery room showed no evidence of pneumothorax and the catheter tip as described above.   Plain films of the abdomen obtained prior to  discharge showed mild prominence of small bowel loops raising the question of an adynamic ileus versus a very partial small bowel obstruction.   The patient will be placed on Reglan 10 mg a.c./at bedtime and arrangements are in place for an upper GI small bowel follow-through on 03/15/2014, to assess for any evidence of obstruction.    ____________________________ Robert Bellow, MD jwb:dmm D: 03/13/2014 12:15:40 ET T: 03/13/2014 12:34:09 ET JOB#: [id number]  cc: Robert Bellow, MD, <Dictator> Martie Lee. Oliva Bustard, MD JEFFREY Amedeo Kinsman MD ELECTRONICALLY SIGNED 03/13/2014 15:55

## 2015-02-17 NOTE — Op Note (Signed)
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  OPERATIVE PROCEDURE:  1.  Hand-assisted takedown of the splenic flexure with resection and colocolostomy.  2.  Biopsy of pancreatic mass.  3.  Biopsy of left lobe of the liver mass.   SURGEON:  Hervey Ard, M.D.   ASSISTANTMckinley Jewel, M.D.   ANESTHESIA:  General endotracheal under Dr. Andree Elk.   ESTIMATED BLOOD LOSS:  100 mL.   FLUID REPLACEMENT:  2400 mL crystalloid.    CLINICAL NOTE:  This 44 year old woman was noted to have profound weight loss and heme-positive stools with new onset anemia.  Colostomy completed by Verdie Shire, M.D., showed an obstructing lesion at the splenic flexure.  Preoperative CT scanning shows no clear evidence of metastatic disease although an isolated pancreatic mass.  Preoperative CA 19-9 is normal at less than 1.  CEA is elevated at 9.4.  The patient was considered a candidate for colon resection.    The patient received Invanz and Entereg  prior to the procedure.  Pneumatic stockings and SCD stockings were used for DVT prophylaxis.   After the induction of general endotracheal anesthesia, the abdomen was prepped with ChloraPrep and draped.  A Foley catheter was placed by the nurse.  In Trendelenburg position, a Veress needle was placed through a transumbilical incision.  After assuring intra-abdominal location with the hanging drop test, the abdomen was insufflated with CO2 at 10 mmHg pressure.  A 10 mm step port was expanded and later exchanged for a 12 mm port.  A 10 mm port was placed into the epigastrium to the left of the falciform ligament and a similar port placed in the left lower quadrant.  The patient was supported on a beanbag and was rolled steeply to the left in the reverse  Trendelenburg position.  The tumor mass was evident at the proximal portion of the splenic flexure.  There were dense adhesions between the mass and the lateral abdominal wall.  Making use of the harmonic scalpel the area was gently teased free.  It was found that there was a microperforation with a small collection less than 1 mL of pus evident in this area.  During takedown of the splenic flexure a small opening was made in the left diaphragm.  The patient experienced no change in cardiopulmonary function.  This was closed with a 2-0 absorbable suture making use of the Endo 360 device.  It was difficult to mobilize the tumor mass which was approximately the size of an orange from the adjacent tissue and it was elected to place a hand port replacing the epigastric port site.  A 7.5 cm incision was made and a hand port placed.  Making use of tactile dissection of the splenic flexure was mobilized without injury to the spleen or stomach.  A hard mass at the base of the small bowel mesentery immediately adjacent to the SMA was evident suggesting metastatic spread.  The proximal left colon was mobilized as well as the transverse colon.  At this point the hand port was removed and the upper abdominal incision extended about 4 cm.  The bowel was brought through the wound which was protected with a wound protector and the proximal bowel divided with a GIA stapler.  The distal bowel was divided in a  similar fashion.  Initial plans was for a side-to-side functional end-to-end anastomosis.  The anatomy did not lend itself to this and subsequently an end-to-end stapled anastomosis was completed using a 29 mm CEA stapler placed through a proximal colotomy.  This showed two complete donuts and the colotomy was closed with a TA 60 stapler with 3.5 mm staples.  This was reinforced with interrupted 3-0 silk figure-of-eight sutures.   Attention was turned to the left upper quadrant.  Good hemostasis was noted.  There was a hard  nodular area on the pancreas that is identified on preoperative CT.  A shaved biopsy showed only pancreatic tissue.  Good hemostasis was achieved.  Palpation of the right lobe of the liver was unremarkable.  Palpation of the left lobe showed a firm nodular area just to the left of the falciform ligament.  Review of the preoperative CT showed no clear defect at the superficial level, although deep cysts were identified.  A Tru-Cut biopsy device was used and multiple samples were obtained for a hard nodular area.  This was sent for routine histology.  Good hemostasis was achieved with electrocautery to the liver capsule.   The abdomen was irrigated.  Sponge, tape, and instrument count was correct with good hemostasis.  Peritoneum was closed with a running 2-0 Vicryl.  The fascia was closed with an interrupted 0 Maxon figure-of-eight sutures.  The adipose layer was closed with a running 2-0 Vicryl.  The skin was closed with staples.  Port site incisions were closed with staples.  The patient tolerated the procedure well and was taken to the recovery room in stable condition.     ____________________________ Robert Bellow, MD jwb:ea D: 02/17/2014 18:56:51 ET T: 02/18/2014 00:29:50 ET JOB#: [id number]  cc: Robert Bellow, MD, <Dictator> Lupita Dawn. Candace Cruise, MD Martie Lee. Oliva Bustard, MD Robert Bellow, MD Sherrin Stahle Amedeo Kinsman MD ELECTRONICALLY SIGNED 02/19/2014 11:50

## 2015-02-22 LAB — CBC CANCER CENTER
BASOS PCT: 0.2 %
Basophil #: 0 x10 3/mm (ref 0.0–0.1)
EOS PCT: 0 %
Eosinophil #: 0 x10 3/mm (ref 0.0–0.7)
HCT: 40.3 % (ref 35.0–47.0)
HGB: 13.6 g/dL (ref 12.0–16.0)
Lymphocyte #: 0.7 x10 3/mm — ABNORMAL LOW (ref 1.0–3.6)
Lymphocyte %: 15 %
MCH: 30.3 pg (ref 26.0–34.0)
MCHC: 33.6 g/dL (ref 32.0–36.0)
MCV: 90 fL (ref 80–100)
Monocyte #: 0.1 x10 3/mm — ABNORMAL LOW (ref 0.2–0.9)
Monocyte %: 1.9 %
NEUTROS PCT: 82.9 %
Neutrophil #: 3.8 x10 3/mm (ref 1.4–6.5)
PLATELETS: 185 x10 3/mm (ref 150–440)
RBC: 4.48 10*6/uL (ref 3.80–5.20)
RDW: 14.1 % (ref 11.5–14.5)
WBC: 4.5 x10 3/mm (ref 3.6–11.0)

## 2015-02-22 LAB — COMPREHENSIVE METABOLIC PANEL
ALT: 13 U/L — AB
Albumin: 3.7 g/dL
Alkaline Phosphatase: 69 U/L
Anion Gap: 3 — ABNORMAL LOW (ref 7–16)
BUN: 9 mg/dL
Bilirubin,Total: 0.5 mg/dL
CHLORIDE: 109 mmol/L
Calcium, Total: 9 mg/dL
Co2: 22 mmol/L
Creatinine: 0.56 mg/dL
EGFR (African American): >60
Glucose: 178 mg/dL — ABNORMAL HIGH
Potassium: 4.3 mmol/L
SGOT(AST): 24 U/L
Sodium: 134 mmol/L — ABNORMAL LOW
TOTAL PROTEIN: 7.5 g/dL

## 2015-03-02 ENCOUNTER — Other Ambulatory Visit: Payer: Self-pay | Admitting: *Deleted

## 2015-03-02 DIAGNOSIS — C189 Malignant neoplasm of colon, unspecified: Secondary | ICD-10-CM

## 2015-03-07 ENCOUNTER — Other Ambulatory Visit: Payer: Self-pay | Admitting: Oncology

## 2015-03-08 ENCOUNTER — Inpatient Hospital Stay: Payer: 59 | Attending: Oncology

## 2015-03-08 ENCOUNTER — Encounter: Payer: Self-pay | Admitting: Oncology

## 2015-03-08 ENCOUNTER — Other Ambulatory Visit: Payer: Self-pay | Admitting: Family Medicine

## 2015-03-08 ENCOUNTER — Inpatient Hospital Stay: Payer: 59

## 2015-03-08 ENCOUNTER — Inpatient Hospital Stay (HOSPITAL_BASED_OUTPATIENT_CLINIC_OR_DEPARTMENT_OTHER): Payer: 59 | Admitting: Oncology

## 2015-03-08 VITALS — BP 133/76 | HR 68 | Resp 20

## 2015-03-08 VITALS — BP 110/75 | HR 75 | Temp 96.4°F | Wt 190.3 lb

## 2015-03-08 DIAGNOSIS — Z79899 Other long term (current) drug therapy: Secondary | ICD-10-CM | POA: Insufficient documentation

## 2015-03-08 DIAGNOSIS — C185 Malignant neoplasm of splenic flexure: Secondary | ICD-10-CM

## 2015-03-08 DIAGNOSIS — C787 Secondary malignant neoplasm of liver and intrahepatic bile duct: Secondary | ICD-10-CM

## 2015-03-08 DIAGNOSIS — E669 Obesity, unspecified: Secondary | ICD-10-CM | POA: Insufficient documentation

## 2015-03-08 DIAGNOSIS — Z5111 Encounter for antineoplastic chemotherapy: Secondary | ICD-10-CM | POA: Insufficient documentation

## 2015-03-08 DIAGNOSIS — Z888 Allergy status to other drugs, medicaments and biological substances status: Secondary | ICD-10-CM | POA: Insufficient documentation

## 2015-03-08 DIAGNOSIS — C189 Malignant neoplasm of colon, unspecified: Secondary | ICD-10-CM

## 2015-03-08 LAB — COMPREHENSIVE METABOLIC PANEL
ALBUMIN: 3.6 g/dL (ref 3.5–5.0)
ALT: 18 U/L (ref 14–54)
ANION GAP: 4 — AB (ref 5–15)
AST: 31 U/L (ref 15–41)
Alkaline Phosphatase: 62 U/L (ref 38–126)
BUN: 9 mg/dL (ref 6–20)
CALCIUM: 8.8 mg/dL — AB (ref 8.9–10.3)
CO2: 23 mmol/L (ref 22–32)
Chloride: 107 mmol/L (ref 101–111)
Creatinine, Ser: 0.6 mg/dL (ref 0.44–1.00)
GFR calc Af Amer: >60 mL/min (ref >60)
GFR calc non Af Amer: >60 mL/min (ref >60)
GLUCOSE: 157 mg/dL — AB (ref 65–99)
POTASSIUM: 4 mmol/L (ref 3.5–5.1)
SODIUM: 134 mmol/L — AB (ref 135–145)
TOTAL PROTEIN: 7.4 g/dL (ref 6.5–8.1)
Total Bilirubin: 0.7 mg/dL (ref 0.3–1.2)

## 2015-03-08 LAB — CBC WITH DIFFERENTIAL/PLATELET
Basophils Absolute: 0 10*3/uL (ref 0–0.1)
Basophils Relative: 0 %
EOS PCT: 0 %
Eosinophils Absolute: 0 10*3/uL (ref 0–0.7)
HEMATOCRIT: 40.8 % (ref 35.0–47.0)
HEMOGLOBIN: 13.6 g/dL (ref 12.0–16.0)
LYMPHS ABS: 0.8 10*3/uL — AB (ref 1.0–3.6)
Lymphocytes Relative: 21 %
MCH: 29.8 pg (ref 26.0–34.0)
MCHC: 33.2 g/dL (ref 32.0–36.0)
MCV: 89.8 fL (ref 80.0–100.0)
MONO ABS: 0.1 10*3/uL — AB (ref 0.2–0.9)
Monocytes Relative: 2 %
Neutro Abs: 3 10*3/uL (ref 1.4–6.5)
Neutrophils Relative %: 77 %
Platelets: 158 10*3/uL (ref 150–440)
RBC: 4.55 MIL/uL (ref 3.80–5.20)
RDW: 14.8 % — ABNORMAL HIGH (ref 11.5–14.5)
WBC: 3.9 10*3/uL (ref 3.6–11.0)

## 2015-03-08 MED ORDER — DEXTROSE 5 % IV SOLN
85.0000 mg/m2 | Freq: Once | INTRAVENOUS | Status: AC
  Administered 2015-03-08: 165.75 mg via INTRAVENOUS
  Filled 2015-03-08: qty 33.15

## 2015-03-08 MED ORDER — METHYLPREDNISOLONE SODIUM SUCC 125 MG IJ SOLR
40.0000 mg | Freq: Once | INTRAMUSCULAR | Status: AC
  Administered 2015-03-08: 40 mg via INTRAVENOUS

## 2015-03-08 MED ORDER — SODIUM CHLORIDE 0.9 % IJ SOLN
10.0000 mL | INTRAMUSCULAR | Status: DC | PRN
  Administered 2015-03-08: 10 mL
  Filled 2015-03-08: qty 10

## 2015-03-08 MED ORDER — SODIUM CHLORIDE 0.9 % IV SOLN
1850.0000 mg/m2 | INTRAVENOUS | Status: DC
  Administered 2015-03-08: 3600 mg via INTRAVENOUS
  Filled 2015-03-08: qty 72

## 2015-03-08 MED ORDER — FOSAPREPITANT DIMEGLUMINE INJECTION 150 MG
Freq: Once | INTRAVENOUS | Status: AC
  Administered 2015-03-08: 11:00:00 via INTRAVENOUS
  Filled 2015-03-08: qty 5

## 2015-03-08 MED ORDER — METHYLPREDNISOLONE SODIUM SUCC 125 MG IJ SOLR
40.0000 mg | Freq: Once | INTRAMUSCULAR | Status: AC
  Administered 2015-03-08: 40 mg via INTRAVENOUS
  Filled 2015-03-08: qty 2

## 2015-03-08 MED ORDER — LORATADINE 10 MG PO TABS
10.0000 mg | ORAL_TABLET | Freq: Once | ORAL | Status: AC
  Administered 2015-03-08: 10 mg via ORAL
  Filled 2015-03-08: qty 1

## 2015-03-08 MED ORDER — OXALIPLATIN CHEMO INJECTION FOR DESENSITIZATION 100 MG/20ML
85.0000 mg/m2 | Freq: Once | INTRAVENOUS | Status: AC
  Administered 2015-03-08: 165.75 mg via INTRAVENOUS
  Filled 2015-03-08: qty 33.15

## 2015-03-08 MED ORDER — DEXTROSE 5 % IV SOLN
Freq: Once | INTRAVENOUS | Status: AC
  Administered 2015-03-08: 11:00:00 via INTRAVENOUS
  Filled 2015-03-08: qty 250

## 2015-03-08 MED ORDER — HEPARIN SOD (PORK) LOCK FLUSH 100 UNIT/ML IV SOLN
500.0000 [IU] | Freq: Once | INTRAVENOUS | Status: AC | PRN
  Filled 2015-03-08: qty 5

## 2015-03-08 MED ORDER — FLUOROURACIL CHEMO INJECTION 2.5 GM/50ML
380.0000 mg/m2 | Freq: Once | INTRAVENOUS | Status: DC
  Filled 2015-03-08: qty 15

## 2015-03-08 MED ORDER — SODIUM CHLORIDE 0.9 % IV SOLN: Freq: Once | INTRAVENOUS | Status: DC

## 2015-03-08 MED ORDER — PALONOSETRON HCL INJECTION 0.25 MG/5ML
0.2500 mg | Freq: Once | INTRAVENOUS | Status: AC
Start: 2015-03-08 — End: 2015-03-08
  Administered 2015-03-08: 0.25 mg via INTRAVENOUS
  Filled 2015-03-08: qty 5

## 2015-03-08 MED ORDER — LEUCOVORIN CALCIUM INJECTION 350 MG
750.0000 mg | Freq: Once | INTRAVENOUS | Status: AC
  Administered 2015-03-08: 750 mg via INTRAVENOUS
  Filled 2015-03-08: qty 25

## 2015-03-08 MED ORDER — FLUOROURACIL CHEMO INJECTION 2.5 GM/50ML
750.0000 mg | Freq: Once | INTRAVENOUS | Status: AC
  Administered 2015-03-08: 750 mg via INTRAVENOUS
  Filled 2015-03-08: qty 15

## 2015-03-08 MED ORDER — FAMOTIDINE IN NACL 20-0.9 MG/50ML-% IV SOLN
20.0000 mg | Freq: Once | INTRAVENOUS | Status: AC
  Administered 2015-03-08: 20 mg via INTRAVENOUS
  Filled 2015-03-08: qty 50

## 2015-03-08 MED ORDER — DIPHENHYDRAMINE HCL 50 MG/ML IJ SOLN
50.0000 mg | Freq: Once | INTRAMUSCULAR | Status: AC
  Administered 2015-03-08: 50 mg via INTRAVENOUS
  Filled 2015-03-08: qty 1

## 2015-03-08 NOTE — Progress Notes (Signed)
[place] @ [facility] Telephone:(336) [phone]  Fax:(336) [place]: 1971/10/25  MR#: [id number]  LGX#:[id number]  Patient Care Team: Lynnell Jude, MD as PCP - General (Family Medicine)  CHIEF COMPLAINT:  Chief Complaint  Patient presents with  . Follow-up    Oncology History   1. Carcinoma of the colon, splenic  flexure.   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 2. K-RAS mutated. 3. Patient started on FOLFOX and Avastin 4 th June, 2015 4. Allergic reaction  characterized by generalized itching and redness  of face and palm on June 15, 2014 5. Started on FOLFIRI  and avastin   July 13, 2014 6. Finished 12 cycles of chemotherapy, 5-FU based FOLFOX was changed to FOLFIRI BECAUSE OF SENSITIVITY TO OXALIPLATIN(September 07, 2014) 7. Started on maintenance chemotherapy with 5-FU leucovorin and Avastin from September 28, 2014     Colon cancer   02/10/2014 Initial Diagnosis Colon cancer    No flowsheet data found.  INTERVAL HISTORY: 44 year old lady with stage IV carcinoma of colon came today further follow-up.  Patient is getting FOLFOX chemotherapy with the sensitizes and protocol each treatment lasting for more than 7 hours.  So far tolerated to treatment without any significant problems.  Has some numbness in the lower extremity intermittent.  No chills.  No fever.  No nausea.  No vomiting.  No diarrhea.  REVIEW OF SYSTEMS:   GENERAL:  Feels good.  Active.  No fevers, sweats or weight loss. PERFORMANCE STATUS (ECOG): 0 HEENT:  No visual changes, runny nose, sore throat, mouth sores or tenderness. Lungs: No shortness of breath or cough.  No hemoptysis. Cardiac:  No chest pain, palpitations, orthopnea, or PND. GI:  No nausea, vomiting, diarrhea, constipation, melena or hematochezia. GU:  No urgency, frequency, dysuria, or hematuria. Musculoskeletal:  No  back pain.  No joint pain.  No muscle tenderness. Extremities:  No pain or swelling. Skin:  No rashes or skin changes. Neuro:  No headache, numbness or weakness, balance or coordination issues. Endocrine:  No diabetes, thyroid issues, hot flashes or night sweats. Psych:  No mood changes, depression or anxiety. Pain:  No focal pain. Review of systems:  All other systems reviewed and found to be negative.  As per HPI. Otherwise, a complete review of systems is negatve.  PAST MEDICAL HISTORY: Past Medical History  Diagnosis Date  . Ulcer   . Anemia   . Cancer 2015    colon  . 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 On adjuvant chemotherapy  . Obesity   . Constipation   . Migraines     PAST SURGICAL HISTORY: Past Surgical History  Procedure Laterality Date  . Cholecystectomy  2014  . Tubal ligation  2005  . Colonoscopy  2015  . Upper gi endoscopy  2015    FAMILY HISTORY No significant family history of malignancy       ADVANCED DIRECTIVES:   Patient does have advanced healthcare directive HEALTH MAINTENANCE: History  Substance Use Topics  . Smoking status: Never Smoker   . Smokeless tobacco: Not on file  . Alcohol Use: No     Colonoscopy: Getting it done regularly after diagnosis of colon cancer  PAP:  Bone density:  Lipid panel:  Allergies  Allergen Reactions  . Oxaliplatin  Itching    Current Outpatient Prescriptions  Medication Sig Dispense Refill  . acetaminophen (TYLENOL) 500 MG tablet Take 500 mg by mouth every 6 (six) hours as needed.    . diphenoxylate-atropine (LOMOTIL) 2.5-0.025 MG per tablet Take 1 tablet by mouth 4 (four) times daily as needed for diarrhea or loose stools (2 tabs to start, then one by mouth every 6 hours if needed for diarrhea.). 30 tablet 0  . polyethylene glycol (MIRALAX / GLYCOLAX) packet Take 17 g by mouth daily.    . ferrous fumarate-iron polysaccharide  complex (TANDEM) 162-115.2 MG CAPS Take 1 capsule by mouth 2 (two) times daily.    Marland Kitchen gabapentin (NEURONTIN) 300 MG capsule Take 300 mg by mouth 2 (two) times daily.    Marland Kitchen ibuprofen (ADVIL,MOTRIN) 800 MG tablet Take 800 mg by mouth 3 (three) times daily.    Marland Kitchen lidocaine (LIDODERM) 5 % Place 1 patch onto the skin daily. Remove & Discard patch within 12 hours or as directed by MD    . methocarbamol (ROBAXIN) 750 MG tablet Take 750 mg by mouth 2 (two) times daily. 2 tabs    . pantoprazole (PROTONIX) 40 MG tablet Take 40 mg by mouth 2 (two) times daily.    . potassium chloride (K-DUR,KLOR-CON) 10 MEQ tablet 10 meq 2 times daily for 5 days.    . potassium chloride (KLOR-CON) 20 MEQ packet Take 20 mEq by mouth 3 (three) times daily. One packet in liquid of choice three times a day (Patient not taking: Reported on 03/08/2015) 30 packet 2  . promethazine (PHENERGAN) 25 MG tablet Take 1 tablet (25 mg total) by mouth every 6 (six) hours as needed for nausea or vomiting. (Patient not taking: Reported on 03/08/2015) 30 tablet 0  . sulfamethoxazole-trimethoprim (BACTRIM DS,SEPTRA DS) 800-160 MG per tablet Take 1 tablet by mouth 2 (two) times daily.    . traMADol (ULTRAM) 50 MG tablet Take 50 mg by mouth every 4 (four) hours.    . valACYclovir (VALTREX) 1000 MG tablet Take 1,000 mg by mouth 2 (two) times daily.     No current facility-administered medications for this visit.    OBJECTIVE:  Filed Vitals:   03/08/15 0850  BP: 110/75  Pulse: 75  Temp: 96.4 F (35.8 C)     Body mass index is 31.66 kg/(m^2).    ECOG FS:0 - Asymptomatic  PHYSICAL EXAM: GENERAL:  Well developed, well nourished, sitting comfortably in the exam room in no acute distress. MENTAL STATUS:  Alert and oriented to person, place and time. HEAD:  .  Normocephalic, atraumatic, face symmetric, no Cushingoid features. EYES:   Pupils equal round and reactive to light and accomodation.  No conjunctivitis or scleral icterus. ENT:  Oropharynx  clear without lesion.  Tongue normal. Mucous membranes moist.  RESPIRATORY:  Clear to auscultation without rales, wheezes or rhonchi. CARDIOVASCULAR:  Regular rate and rhythm without murmur, rub or gallop. BREAST:  Right breast without masses, skin changes or nipple discharge.  Left breast without masses, skin changes or nipple discharge. ABDOMEN:  Soft, non-tender, with active bowel sounds, and no hepatosplenomegaly.  No masses. BACK:  No CVA tenderness.  No tenderness on percussion of the back or rib cage. SKIN:  No rashes, ulcers or lesions. EXTREMITIES: No edema, no skin discoloration or tenderness.  No palpable cords. LYMPH NODES: No palpable cervical, supraclavicular, axillary or inguinal adenopathy  NEUROLOGICAL: Unremarkable. PSYCH:  Appropriate.   LAB RESULTS:  Infusion on 03/08/2015  Component Date Value Ref  Range Status  . WBC 03/08/2015 3.9  3.6 - 11.0 K/uL Final  . RBC 03/08/2015 4.55  3.80 - 5.20 MIL/uL Final  . Hemoglobin 03/08/2015 13.6  12.0 - 16.0 g/dL Final  . HCT 03/08/2015 40.8  35.0 - 47.0 % Final  . MCV 03/08/2015 89.8  80.0 - 100.0 fL Final  . MCH 03/08/2015 29.8  26.0 - 34.0 pg Final  . MCHC 03/08/2015 33.2  32.0 - 36.0 g/dL Final  . RDW 03/08/2015 14.8* 11.5 - 14.5 % Final  . Platelets 03/08/2015 158  150 - 440 K/uL Final  . Neutrophils Relative % 03/08/2015 77   Final  . Neutro Abs 03/08/2015 3.0  1.4 - 6.5 K/uL Final  . Lymphocytes Relative 03/08/2015 21   Final  . Lymphs Abs 03/08/2015 0.8* 1.0 - 3.6 K/uL Final  . Monocytes Relative 03/08/2015 2   Final  . Monocytes Absolute 03/08/2015 0.1* 0.2 - 0.9 K/uL Final  . Eosinophils Relative 03/08/2015 0   Final  . Eosinophils Absolute 03/08/2015 0.0  0 - 0.7 K/uL Final  . Basophils Relative 03/08/2015 0   Final  . Basophils Absolute 03/08/2015 0.0  0 - 0.1 K/uL Final  . Sodium 03/08/2015 134* 135 - 145 mmol/L Final  . Potassium 03/08/2015 4.0  3.5 - 5.1 mmol/L Final  . Chloride 03/08/2015 107  101 - 111  mmol/L Final  . CO2 03/08/2015 23  22 - 32 mmol/L Final  . Glucose, Bld 03/08/2015 157* 65 - 99 mg/dL Final  . BUN 03/08/2015 9  6 - 20 mg/dL Final  . Creatinine, Ser 03/08/2015 0.60  0.44 - 1.00 mg/dL Final  . Calcium 03/08/2015 8.8* 8.9 - 10.3 mg/dL Final  . Total Protein 03/08/2015 7.4  6.5 - 8.1 g/dL Final  . Albumin 03/08/2015 3.6  3.5 - 5.0 g/dL Final  . AST 03/08/2015 31  15 - 41 U/L Final  . ALT 03/08/2015 18  14 - 54 U/L Final  . Alkaline Phosphatase 03/08/2015 62  38 - 126 U/L Final  . Total Bilirubin 03/08/2015 0.7  0.3 - 1.2 mg/dL Final  . GFR calc non Af Amer 03/08/2015 >60  >60 mL/min Final  . GFR calc Af Amer 03/08/2015 >60  >60 mL/min Final   Comment: (NOTE) The eGFR has been calculated using the CKD EPI equation. This calculation has not been validated in all clinical situations. eGFR's persistently <60 mL/min signify possible Chronic Kidney Disease.   . Anion gap 03/08/2015 4* 5 - 15 Final    No results found for: LABCA2 Lab Results  Component Value Date   CA199 <1 02/03/2014   Lab Results  Component Value Date   CEA 4.8* 01/25/2015      ASSESSMENT: Stage IV carcinoma of colon Proceed with chemotherapy  MEDICAL DECISION MAKING:   would be monitored in now infusion center very carefully because of desensitizing patient protocol. All lab data has been reviewed Consider doing another PET scan in 4 weeks Recheck CEA before next treatment  Patient expressed understanding and was in agreement with this plan. She also understands that She can call clinic at any time with any questions, concerns, or complaints.    Colon cancer   Staging form: Colon and Rectum, AJCC 7th Edition     Clinical: Stage IVB (yT3, N1, M1b) - Signed by [name], MD on 03/07/2015   [name], MD   03/08/2015 9:10 AM

## 2015-03-09 LAB — CEA: CEA: 6 ng/mL — AB (ref 0.0–4.7)

## 2015-03-10 ENCOUNTER — Ambulatory Visit: Payer: 59

## 2015-03-10 VITALS — BP 120/78 | HR 60 | Temp 96.5°F | Resp 21

## 2015-03-10 DIAGNOSIS — C185 Malignant neoplasm of splenic flexure: Secondary | ICD-10-CM | POA: Diagnosis not present

## 2015-03-10 MED ORDER — HEPARIN SOD (PORK) LOCK FLUSH 100 UNIT/ML IV SOLN
500.0000 [IU] | Freq: Once | INTRAVENOUS | Status: AC | PRN
  Administered 2015-03-10: 500 [IU]

## 2015-03-10 MED ORDER — SODIUM CHLORIDE 0.9 % IJ SOLN
10.0000 mL | INTRAMUSCULAR | Status: DC | PRN
  Administered 2015-03-10: 10 mL
  Filled 2015-03-10: qty 10

## 2015-03-21 ENCOUNTER — Inpatient Hospital Stay: Payer: 59

## 2015-03-21 ENCOUNTER — Inpatient Hospital Stay (HOSPITAL_BASED_OUTPATIENT_CLINIC_OR_DEPARTMENT_OTHER): Payer: 59 | Admitting: Oncology

## 2015-03-21 VITALS — BP 116/82 | HR 71 | Temp 96.7°F | Wt 190.3 lb

## 2015-03-21 DIAGNOSIS — C787 Secondary malignant neoplasm of liver and intrahepatic bile duct: Secondary | ICD-10-CM

## 2015-03-21 DIAGNOSIS — E669 Obesity, unspecified: Secondary | ICD-10-CM

## 2015-03-21 DIAGNOSIS — C778 Secondary and unspecified malignant neoplasm of lymph nodes of multiple regions: Secondary | ICD-10-CM

## 2015-03-21 DIAGNOSIS — C189 Malignant neoplasm of colon, unspecified: Secondary | ICD-10-CM

## 2015-03-21 DIAGNOSIS — C185 Malignant neoplasm of splenic flexure: Secondary | ICD-10-CM | POA: Diagnosis not present

## 2015-03-21 DIAGNOSIS — Z79899 Other long term (current) drug therapy: Secondary | ICD-10-CM

## 2015-03-21 LAB — CBC WITH DIFFERENTIAL/PLATELET
Basophils Absolute: 0 10*3/uL (ref 0–0.1)
Basophils Relative: 1 %
Eosinophils Absolute: 0 10*3/uL (ref 0–0.7)
HCT: 38.5 % (ref 35.0–47.0)
HEMOGLOBIN: 13 g/dL (ref 12.0–16.0)
Lymphs Abs: 1.7 10*3/uL (ref 1.0–3.6)
MCH: 30.4 pg (ref 26.0–34.0)
MCHC: 33.7 g/dL (ref 32.0–36.0)
MCV: 90.1 fL (ref 80.0–100.0)
Monocytes Absolute: 0.8 10*3/uL (ref 0.2–0.9)
Monocytes Relative: 15 %
Neutro Abs: 3 10*3/uL (ref 1.4–6.5)
Neutrophils Relative %: 53 %
Platelets: 118 10*3/uL — ABNORMAL LOW (ref 150–440)
RBC: 4.27 MIL/uL (ref 3.80–5.20)
RDW: 15.6 % — AB (ref 11.5–14.5)
WBC: 5.7 10*3/uL (ref 3.6–11.0)

## 2015-03-21 LAB — BASIC METABOLIC PANEL
Anion gap: 2 — ABNORMAL LOW (ref 5–15)
BUN: 6 mg/dL (ref 6–20)
CO2: 26 mmol/L (ref 22–32)
Calcium: 8.3 mg/dL — ABNORMAL LOW (ref 8.9–10.3)
Chloride: 107 mmol/L (ref 101–111)
Creatinine, Ser: 0.44 mg/dL (ref 0.44–1.00)
Glucose, Bld: 91 mg/dL (ref 65–99)
POTASSIUM: 3.4 mmol/L — AB (ref 3.5–5.1)
SODIUM: 135 mmol/L (ref 135–145)

## 2015-03-21 MED ORDER — HEPARIN SOD (PORK) LOCK FLUSH 100 UNIT/ML IV SOLN
500.0000 [IU] | Freq: Once | INTRAVENOUS | Status: AC | PRN
  Administered 2015-03-21: 500 [IU]

## 2015-03-21 MED ORDER — HEPARIN SOD (PORK) LOCK FLUSH 100 UNIT/ML IV SOLN
INTRAVENOUS | Status: AC
  Filled 2015-03-21: qty 5

## 2015-03-21 MED ORDER — SODIUM CHLORIDE 0.9 % IJ SOLN
10.0000 mL | INTRAMUSCULAR | Status: AC | PRN
  Administered 2015-03-21: 10 mL
  Filled 2015-03-21: qty 10

## 2015-03-21 NOTE — Progress Notes (Signed)
Patient has never smoked.  Does not have living will. 

## 2015-03-22 ENCOUNTER — Other Ambulatory Visit: Payer: 59

## 2015-03-22 ENCOUNTER — Inpatient Hospital Stay: Payer: 59

## 2015-03-22 ENCOUNTER — Ambulatory Visit: Payer: 59 | Admitting: Oncology

## 2015-03-22 VITALS — BP 120/82 | HR 77 | Temp 97.3°F | Resp 18

## 2015-03-22 DIAGNOSIS — C185 Malignant neoplasm of splenic flexure: Secondary | ICD-10-CM | POA: Diagnosis not present

## 2015-03-22 DIAGNOSIS — C189 Malignant neoplasm of colon, unspecified: Secondary | ICD-10-CM

## 2015-03-22 LAB — CEA: CEA: 5 ng/mL — ABNORMAL HIGH (ref 0.0–4.7)

## 2015-03-22 MED ORDER — PALONOSETRON HCL INJECTION 0.25 MG/5ML
0.2500 mg | Freq: Once | INTRAVENOUS | Status: AC
  Administered 2015-03-22: 0.25 mg via INTRAVENOUS
  Filled 2015-03-22: qty 5

## 2015-03-22 MED ORDER — METHYLPREDNISOLONE SODIUM SUCC 125 MG IJ SOLR
40.0000 mg | Freq: Once | INTRAMUSCULAR | Status: AC
  Administered 2015-03-22: 40 mg via INTRAVENOUS

## 2015-03-22 MED ORDER — DEXTROSE 5 % IV SOLN
Freq: Once | INTRAVENOUS | Status: AC
  Administered 2015-03-22: 09:00:00 via INTRAVENOUS
  Filled 2015-03-22: qty 250

## 2015-03-22 MED ORDER — HEPARIN SOD (PORK) LOCK FLUSH 100 UNIT/ML IV SOLN: 500.0000 [IU] | Freq: Once | INTRAVENOUS | Status: DC

## 2015-03-22 MED ORDER — FAMOTIDINE IN NACL 20-0.9 MG/50ML-% IV SOLN
20.0000 mg | Freq: Once | INTRAVENOUS | Status: AC
  Administered 2015-03-22: 20 mg via INTRAVENOUS
  Filled 2015-03-22: qty 50

## 2015-03-22 MED ORDER — SODIUM CHLORIDE 0.9 % IV SOLN
1850.0000 mg/m2 | INTRAVENOUS | Status: DC
  Administered 2015-03-22: 3600 mg via INTRAVENOUS
  Filled 2015-03-22: qty 72

## 2015-03-22 MED ORDER — METHYLPREDNISOLONE SODIUM SUCC 125 MG IJ SOLR
40.0000 mg | Freq: Once | INTRAMUSCULAR | Status: AC
  Administered 2015-03-22: 40 mg via INTRAVENOUS
  Filled 2015-03-22: qty 2

## 2015-03-22 MED ORDER — LEUCOVORIN CALCIUM INJECTION 350 MG
750.0000 mg | Freq: Once | INTRAVENOUS | Status: AC
  Administered 2015-03-22: 750 mg via INTRAVENOUS
  Filled 2015-03-22: qty 27.5

## 2015-03-22 MED ORDER — LORATADINE 10 MG PO TABS: 10.0000 mg | ORAL_TABLET | Freq: Once | ORAL | Status: DC

## 2015-03-22 MED ORDER — SODIUM CHLORIDE 0.9 % IV SOLN: Freq: Once | INTRAVENOUS | Status: DC

## 2015-03-22 MED ORDER — OXALIPLATIN CHEMO INJECTION FOR DESENSITIZATION 100 MG/20ML
85.0000 mg/m2 | Freq: Once | INTRAVENOUS | Status: AC
  Administered 2015-03-22: 165.75 mg via INTRAVENOUS
  Filled 2015-03-22: qty 33.15

## 2015-03-22 MED ORDER — FLUOROURACIL CHEMO INJECTION 2.5 GM/50ML
380.0000 mg/m2 | Freq: Once | INTRAVENOUS | Status: AC
  Administered 2015-03-22: 750 mg via INTRAVENOUS
  Filled 2015-03-22: qty 15

## 2015-03-22 MED ORDER — FOSAPREPITANT DIMEGLUMINE INJECTION 150 MG
Freq: Once | INTRAVENOUS | Status: AC
  Administered 2015-03-22: 10:00:00 via INTRAVENOUS
  Filled 2015-03-22: qty 5

## 2015-03-22 MED ORDER — SODIUM CHLORIDE 0.9 % IJ SOLN
10.0000 mL | INTRAMUSCULAR | Status: DC | PRN
  Administered 2015-03-22: 10 mL via INTRAVENOUS
  Filled 2015-03-22: qty 10

## 2015-03-22 MED ORDER — DIPHENHYDRAMINE HCL 50 MG/ML IJ SOLN
50.0000 mg | Freq: Once | INTRAMUSCULAR | Status: AC
  Administered 2015-03-22: 50 mg via INTRAVENOUS
  Filled 2015-03-22: qty 1

## 2015-03-24 ENCOUNTER — Inpatient Hospital Stay: Payer: 59

## 2015-03-26 ENCOUNTER — Encounter: Payer: Self-pay | Admitting: Oncology

## 2015-03-26 NOTE — Progress Notes (Signed)
[place] @ [facility] Telephone:(336) [phone]  Fax:(336) [place]: [birth year]  MR#: [id number]  NHA#:[id number]  Patient Care Team: Lynnell Jude, MD as PCP - General (Family Medicine)  CHIEF COMPLAINT:  Chief Complaint  Patient presents with  . Follow-up    Oncology History   1. Carcinoma of the colon, splenic  flexure.   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 2. K-RAS mutated. 3. Patient started on FOLFOX and Avastin 4 th June, 2015 4. Allergic reaction  characterized by generalized itching and redness  of face and palm on June 15, 2014 5. Started on FOLFIRI  and avastin   July 13, 2014 6. Finished 12 cycles of chemotherapy, 5-FU based FOLFOX was changed to FOLFIRI BECAUSE OF SENSITIVITY TO OXALIPLATIN(September 07, 2014) 7. Started on maintenance chemotherapy with 5-FU leucovorin and Avastin from September 28, 2014     Colon cancer   02/10/2014 Initial Diagnosis Colon cancer    Oncology Flowsheet 03/08/2015 03/08/2015 03/22/2015 03/22/2015 03/22/2015 03/22/2015 03/22/2015  Day, Cycle     Day 1, Cycle 3          dexamethasone (DECADRON) IV     [ 12 mg ]          fluorouracil (ADRUCIL) IV     380 mg/m2 1,850 mg/m2        fosaprepitant (EMEND) IV     [ 150 mg ]          leucovorin IV     750 mg          methylPREDNISolone sodium succinate 125 mg/2 mL (SOLU-MEDROL) IV 40 mg 40 mg 40 mg 40 mg 40 mg 40 mg 40 mg  oxaliplatin (ELOXATIN) IV 85 mg/m2 85 mg/m2 85 mg/m2 85 mg/m2 85 mg/m2 85 mg/m2 85 mg/m2  palonosetron (ALOXI) IV     0.25 mg            INTERVAL HISTORY: 44 year old lady with stage IV carcinoma of colon came today further follow-up.  Patient is getting FOLFOX chemotherapy with the sensitizes and protocol each treatment lasting for more than 7 hours.   Patient is tolerating chemotherapy very well.  No tingling.  No numbness.  No allergic reaction.   Appetite remains stable.  No abdominal pain.   Here for continuation of chemotherapy  REVIEW OF SYSTEMS:   GENERAL:  Feels good.  Active.  No fevers, sweats or weight loss. PERFORMANCE STATUS (ECOG): 0 HEENT:  No visual changes, runny nose, sore throat, mouth sores or tenderness. Lungs: No shortness of breath or cough.  No hemoptysis. Cardiac:  No chest pain, palpitations, orthopnea, or PND. GI:  No nausea, vomiting, diarrhea, constipation, melena or hematochezia. GU:  No urgency, frequency, dysuria, or hematuria. Musculoskeletal:  No back pain.  No joint pain.  No muscle tenderness. Extremities:  No pain or swelling. Skin:  No rashes or skin changes. Neuro:  No headache, numbness or weakness, balance or coordination issues. Endocrine:  No diabetes, thyroid issues, hot flashes or night sweats. Psych:  No mood changes, depression or anxiety. Pain:  No focal pain. Review of systems:  All other systems reviewed and found to be negative.  As per HPI. Otherwise, a complete review of systems is negatve.  PAST MEDICAL HISTORY: Past Medical History  Diagnosis Date  . Ulcer   . Anemia   . Cancer 2015  colon  . 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 On adjuvant chemotherapy  . Obesity   . Constipation   . Migraines     PAST SURGICAL HISTORY: Past Surgical History  Procedure Laterality Date  . Cholecystectomy  2014  . Tubal ligation  2005  . Colonoscopy  2015  . Upper gi endoscopy  2015    FAMILY HISTORY No significant family history of malignancy       ADVANCED DIRECTIVES:   Patient does have advanced healthcare directive HEALTH MAINTENANCE: History  Substance Use Topics  . Smoking status: Never Smoker   . Smokeless tobacco: Not on file  . Alcohol Use: No      Allergies  Allergen Reactions  . Oxaliplatin Itching    Current Outpatient Prescriptions  Medication Sig Dispense Refill  .  acetaminophen (TYLENOL) 500 MG tablet Take 500 mg by mouth every 6 (six) hours as needed.    . diphenoxylate-atropine (LOMOTIL) 2.5-0.025 MG per tablet Take 1 tablet by mouth 4 (four) times daily as needed for diarrhea or loose stools (2 tabs to start, then one by mouth every 6 hours if needed for diarrhea.). 30 tablet 0  . gabapentin (NEURONTIN) 300 MG capsule Take 300 mg by mouth 2 (two) times daily.    . polyethylene glycol (MIRALAX / GLYCOLAX) packet Take 17 g by mouth daily.    . promethazine (PHENERGAN) 25 MG tablet Take 1 tablet (25 mg total) by mouth every 6 (six) hours as needed for nausea or vomiting. 30 tablet 0  . ferrous fumarate-iron polysaccharide complex (TANDEM) 162-115.2 MG CAPS Take 1 capsule by mouth 2 (two) times daily.    Marland Kitchen ibuprofen (ADVIL,MOTRIN) 800 MG tablet Take 800 mg by mouth 3 (three) times daily.    Marland Kitchen lidocaine (LIDODERM) 5 % Place 1 patch onto the skin daily. Remove & Discard patch within 12 hours or as directed by MD    . methocarbamol (ROBAXIN) 750 MG tablet Take 750 mg by mouth 2 (two) times daily. 2 tabs    . pantoprazole (PROTONIX) 40 MG tablet Take 40 mg by mouth 2 (two) times daily.    . potassium chloride (K-DUR,KLOR-CON) 10 MEQ tablet 10 meq 2 times daily for 5 days.    . potassium chloride (KLOR-CON) 20 MEQ packet Take 20 mEq by mouth 3 (three) times daily. One packet in liquid of choice three times a day (Patient not taking: Reported on 03/08/2015) 30 packet 2  . sulfamethoxazole-trimethoprim (BACTRIM DS,SEPTRA DS) 800-160 MG per tablet Take 1 tablet by mouth 2 (two) times daily.    . traMADol (ULTRAM) 50 MG tablet Take 50 mg by mouth every 4 (four) hours.    . valACYclovir (VALTREX) 1000 MG tablet Take 1,000 mg by mouth 2 (two) times daily.     No current facility-administered medications for this visit.   Facility-Administered Medications Ordered in Other Visits  Medication Dose Route Frequency Provider Last Rate Last Dose  . sodium chloride 0.9 %  injection 10 mL  10 mL Intracatheter PRN [name], MD   10 mL at 03/21/15 1025    OBJECTIVE:  Filed Vitals:   03/21/15 1034  BP: 116/82  Pulse: 71  Temp: 96.7 F (35.9 C)     Body mass index is 31.66 kg/(m^2).    ECOG FS:0 - Asymptomatic  PHYSICAL EXAM: GENERAL:  Well developed, well nourished, sitting comfortably in the exam room in no acute  distress. MENTAL STATUS:  Alert and oriented to person, place and time. HEAD:  .  Normocephalic, atraumatic, face symmetric, no Cushingoid features. EYES:   Pupils equal round and reactive to light and accomodation.  No conjunctivitis or scleral icterus. ENT:  Oropharynx clear without lesion.  Tongue normal. Mucous membranes moist.  RESPIRATORY:  Clear to auscultation without rales, wheezes or rhonchi. CARDIOVASCULAR:  Regular rate and rhythm without murmur, rub or gallop. BREAST:  Right breast without masses, skin changes or nipple discharge.  Left breast without masses, skin changes or nipple discharge. ABDOMEN:  Soft, non-tender, with active bowel sounds, and no hepatosplenomegaly.  No masses. BACK:  No CVA tenderness.  No tenderness on percussion of the back or rib cage. SKIN:  No rashes, ulcers or lesions. EXTREMITIES: No edema, no skin discoloration or tenderness.  No palpable cords. LYMPH NODES: No palpable cervical, supraclavicular, axillary or inguinal adenopathy  NEUROLOGICAL: Unremarkable. PSYCH:  Appropriate.   LAB RESULTS:  Clinical Support on 03/21/2015  Component Date Value Ref Range Status  . WBC 03/21/2015 5.7  3.6 - 11.0 K/uL Final   A-LINE DRAW  . RBC 03/21/2015 4.27  3.80 - 5.20 MIL/uL Final  . Hemoglobin 03/21/2015 13.0  12.0 - 16.0 g/dL Final  . HCT 03/21/2015 38.5  35.0 - 47.0 % Final  . MCV 03/21/2015 90.1  80.0 - 100.0 fL Final  . MCH 03/21/2015 30.4  26.0 - 34.0 pg Final  . MCHC 03/21/2015 33.7  32.0 - 36.0 g/dL Final  . RDW 03/21/2015 15.6* 11.5 - 14.5 % Final  . Platelets 03/21/2015 118* 150 - 440 K/uL  Final  . Neutrophils Relative % 03/21/2015 53%   Final  . Neutro Abs 03/21/2015 3.0  1.4 - 6.5 K/uL Final  . Lymphocytes Relative 03/21/2015 30%   Final  . Lymphs Abs 03/21/2015 1.7  1.0 - 3.6 K/uL Final  . Monocytes Relative 03/21/2015 15%   Final  . Monocytes Absolute 03/21/2015 0.8  0.2 - 0.9 K/uL Final  . Eosinophils Relative 03/21/2015 1%   Final  . Eosinophils Absolute 03/21/2015 0.0  0 - 0.7 K/uL Final  . Basophils Relative 03/21/2015 1%   Final  . Basophils Absolute 03/21/2015 0.0  0 - 0.1 K/uL Final  . CEA 03/21/2015 5.0* 0.0 - 4.7 ng/mL Final   Comment: (NOTE)       Roche ECLIA methodology       Nonsmokers  <3.9                                     Smokers     <5.6 Performed At: [facility] [place], [place] [id number] Lindon Romp MD JJ:[id number]   . Sodium 03/21/2015 135  135 - 145 mmol/L Final  . Potassium 03/21/2015 3.4* 3.5 - 5.1 mmol/L Final  . Chloride 03/21/2015 107  101 - 111 mmol/L Final  . CO2 03/21/2015 26  22 - 32 mmol/L Final  . Glucose, Bld 03/21/2015 91  65 - 99 mg/dL Final  . BUN 03/21/2015 6  6 - 20 mg/dL Final  . Creatinine, Ser 03/21/2015 0.44  0.44 - 1.00 mg/dL Final  . Calcium 03/21/2015 8.3* 8.9 - 10.3 mg/dL Final  . GFR calc non Af Amer 03/21/2015 >60  >60 mL/min Final  . GFR calc Af Amer 03/21/2015 >60  >60 mL/min Final   Comment: (NOTE) The eGFR has been calculated using the  CKD EPI equation. This calculation has not been validated in all clinical situations. eGFR's persistently <60 mL/min signify possible Chronic Kidney Disease.   . Anion gap 03/21/2015 2* 5 - 15 Final    No results found for: LABCA2 Lab Results  Component Value Date   CA199 <1 02/03/2014   Lab Results  Component Value Date   CEA 5.0* 03/21/2015      ASSESSMENT: Stage IV carcinoma of colon Proceed with chemotherapy  MEDICAL DECISION MAKING:   would be monitored in now infusion center very carefully because of desensitizing patient  protocol. All lab data has been reviewed.  Proceed with chemotherapy with desensitizing recent protocol patient would be frequently seen in the infusion center to monitor allergic reaction Repeat PET scan before her next appointment CEA is stable E PET scan remains stable or improved.  Continue with FOLFOX chemotherapy for 2 more cycles and then repeat CEA  Patient expressed understanding and was in agreement with this plan. She also understands that She can call clinic at any time with any questions, concerns, or complaints.    Colon cancer   Staging form: Colon and Rectum, AJCC 7th Edition     Clinical: Stage IVB (yT3, N1, M1b) - Signed by [name], MD on 03/07/2015   [name], MD   03/26/2015 12:13 PM

## 2015-04-02 ENCOUNTER — Other Ambulatory Visit: Payer: Self-pay | Admitting: *Deleted

## 2015-04-02 ENCOUNTER — Other Ambulatory Visit: Payer: Self-pay | Admitting: Family Medicine

## 2015-04-02 DIAGNOSIS — C189 Malignant neoplasm of colon, unspecified: Secondary | ICD-10-CM

## 2015-04-03 ENCOUNTER — Other Ambulatory Visit: Payer: 59

## 2015-04-03 ENCOUNTER — Ambulatory Visit: Payer: 59 | Admitting: Oncology

## 2015-04-03 ENCOUNTER — Ambulatory Visit: Payer: 59

## 2015-04-10 ENCOUNTER — Ambulatory Visit
Admission: RE | Admit: 2015-04-10 | Discharge: 2015-04-10 | Disposition: A | Discharge status: Home / Self Care | Payer: 59 | Source: Ambulatory Visit | Attending: Oncology | Admitting: Oncology

## 2015-04-10 DIAGNOSIS — C189 Malignant neoplasm of colon, unspecified: Secondary | ICD-10-CM | POA: Diagnosis not present

## 2015-04-10 LAB — GLUCOSE, CAPILLARY: Glucose-Capillary: 80 mg/dL (ref 65–99)

## 2015-04-10 MED ORDER — FLUDEOXYGLUCOSE F - 18 (FDG) INJECTION
12.3600 | Freq: Once | INTRAVENOUS | Status: AC | PRN
  Administered 2015-04-10: 12.36 via INTRAVENOUS

## 2015-04-18 ENCOUNTER — Inpatient Hospital Stay: Payer: 59 | Attending: Oncology | Admitting: Oncology

## 2015-04-18 ENCOUNTER — Encounter: Payer: Self-pay | Admitting: Oncology

## 2015-04-18 ENCOUNTER — Inpatient Hospital Stay: Payer: 59

## 2015-04-18 VITALS — BP 120/82 | HR 76 | Temp 98.1°F | Wt 195.1 lb

## 2015-04-18 DIAGNOSIS — Z5111 Encounter for antineoplastic chemotherapy: Secondary | ICD-10-CM | POA: Diagnosis not present

## 2015-04-18 DIAGNOSIS — Z888 Allergy status to other drugs, medicaments and biological substances status: Secondary | ICD-10-CM | POA: Insufficient documentation

## 2015-04-18 DIAGNOSIS — C189 Malignant neoplasm of colon, unspecified: Secondary | ICD-10-CM

## 2015-04-18 DIAGNOSIS — C787 Secondary malignant neoplasm of liver and intrahepatic bile duct: Secondary | ICD-10-CM | POA: Insufficient documentation

## 2015-04-18 DIAGNOSIS — Z79899 Other long term (current) drug therapy: Secondary | ICD-10-CM | POA: Diagnosis not present

## 2015-04-18 DIAGNOSIS — Z9221 Personal history of antineoplastic chemotherapy: Secondary | ICD-10-CM | POA: Diagnosis not present

## 2015-04-18 DIAGNOSIS — C185 Malignant neoplasm of splenic flexure: Secondary | ICD-10-CM | POA: Insufficient documentation

## 2015-04-18 LAB — BASIC METABOLIC PANEL
Anion gap: 4 — ABNORMAL LOW (ref 5–15)
BUN: 7 mg/dL (ref 6–20)
CO2: 23 mmol/L (ref 22–32)
Calcium: 8.3 mg/dL — ABNORMAL LOW (ref 8.9–10.3)
Chloride: 111 mmol/L (ref 101–111)
Creatinine, Ser: 0.57 mg/dL (ref 0.44–1.00)
GFR calc Af Amer: >60 mL/min (ref >60)
GLUCOSE: 104 mg/dL — AB (ref 65–99)
POTASSIUM: 3.4 mmol/L — AB (ref 3.5–5.1)
Sodium: 138 mmol/L (ref 135–145)

## 2015-04-18 LAB — CBC WITH DIFFERENTIAL/PLATELET
BASOS ABS: 0 10*3/uL (ref 0–0.1)
BASOS PCT: 1 %
Eosinophils Absolute: 0.1 10*3/uL (ref 0–0.7)
Eosinophils Relative: 1 %
HCT: 41.9 % (ref 35.0–47.0)
Hemoglobin: 13.9 g/dL (ref 12.0–16.0)
Lymphocytes Relative: 27 %
Lymphs Abs: 1.7 10*3/uL (ref 1.0–3.6)
MCH: 30.2 pg (ref 26.0–34.0)
MCHC: 33.3 g/dL (ref 32.0–36.0)
MCV: 90.8 fL (ref 80.0–100.0)
Monocytes Absolute: 0.6 10*3/uL (ref 0.2–0.9)
Monocytes Relative: 9 %
NEUTROS ABS: 4.1 10*3/uL (ref 1.4–6.5)
Neutrophils Relative %: 62 %
Platelets: 147 10*3/uL — ABNORMAL LOW (ref 150–440)
RBC: 4.61 MIL/uL (ref 3.80–5.20)
RDW: 16.8 % — AB (ref 11.5–14.5)
WBC: 6.6 10*3/uL (ref 3.6–11.0)

## 2015-04-18 LAB — MAGNESIUM: MAGNESIUM: 2.1 mg/dL (ref 1.7–2.4)

## 2015-04-18 NOTE — Progress Notes (Signed)
[place] @ [facility] Telephone:(336) [phone]  Fax:(336) [place]: 07-20-1971  MR#: [id number]  KLK#:[id number]  Patient Care Team: Lynnell Jude, MD as PCP - General (Family Medicine)  CHIEF COMPLAINT:  Chief Complaint  Patient presents with  . Follow-up    Oncology History   1. Carcinoma of the colon, splenic  flexure.   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 2. K-RAS mutated. 3. Patient started on FOLFOX and Avastin 4 th June, 2015 4. Allergic reaction  characterized by generalized itching and redness  of face and palm on June 15, 2014 5. Started on FOLFIRI  and avastin   July 13, 2014 6. Finished 12 cycles of chemotherapy, 5-FU based FOLFOX was changed to FOLFIRI BECAUSE OF SENSITIVITY TO OXALIPLATIN(September 07, 2014) 7. Started on maintenance chemotherapy with 5-FU leucovorin and Avastin from September 28, 2014 8.  Progressive disease based on tumor markers as well as PET scan's so patient was started on FOLFOX chemotherapy with desensitizing  protocol.     Colon cancer   02/10/2014 Initial Diagnosis Colon cancer    Oncology Flowsheet 03/08/2015 03/08/2015 03/22/2015 03/22/2015 03/22/2015 03/22/2015 03/22/2015  Day, Cycle     Day 1, Cycle 3          dexamethasone (DECADRON) IV     [ 12 mg ]          fluorouracil (ADRUCIL) IV     380 mg/m2 1,850 mg/m2        fosaprepitant (EMEND) IV     [ 150 mg ]          leucovorin IV     750 mg          methylPREDNISolone sodium succinate 125 mg/2 mL (SOLU-MEDROL) IV 40 mg 40 mg 40 mg 40 mg 40 mg 40 mg 40 mg  oxaliplatin (ELOXATIN) IV 85 mg/m2 85 mg/m2 85 mg/m2 85 mg/m2 85 mg/m2 85 mg/m2 85 mg/m2  palonosetron (ALOXI) IV     0.25 mg            INTERVAL HISTORY: 44 year old lady with stage IV carcinoma of colon came today further follow-up.  Patient is getting FOLFOX chemotherapy with the sensitizes and protocol  each treatment lasting for more than 7 hours.   Patient is here for continuation of chemotherapy.  No abdominal pain.  No nausea.  No vomiting.  Had a repeat PET scan done which has been reviewed independently.  Patient does not have any tingling numbness. REVIEW OF SYSTEMS:   GENERAL:  Feels good.  Active.  No fevers, sweats or weight loss. PERFORMANCE STATUS (ECOG): 0 HEENT:  No visual changes, runny nose, sore throat, mouth sores or tenderness. Lungs: No shortness of breath or cough.  No hemoptysis. Cardiac:  No chest pain, palpitations, orthopnea, or PND. GI:  No nausea, vomiting, diarrhea, constipation, melena or hematochezia. GU:  No urgency, frequency, dysuria, or hematuria. Musculoskeletal:  No back pain.  No joint pain.  No muscle tenderness. Extremities:  No pain or swelling. Skin:  No rashes or skin changes. Neuro:  No headache, numbness or weakness, balance or coordination issues. Endocrine:  No diabetes, thyroid issues, hot flashes or night sweats. Psych:  No mood changes, depression or anxiety. Pain:  No focal pain. Review of systems:  All other systems reviewed and found to be negative.  As per HPI. Otherwise,  a complete review of systems is negatve.  PAST MEDICAL HISTORY: Past Medical History  Diagnosis Date  . Ulcer   . Anemia   . Cancer 2015    colon  . 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 On adjuvant chemotherapy  . Obesity   . Constipation   . Migraines     PAST SURGICAL HISTORY: Past Surgical History  Procedure Laterality Date  . Cholecystectomy  2014  . Tubal ligation  2005  . Colonoscopy  2015  . Upper gi endoscopy  2015    FAMILY HISTORY No significant family history of malignancy       ADVANCED DIRECTIVES:   Patient does have advanced healthcare directive HEALTH MAINTENANCE: History  Substance Use Topics  . Smoking status: Never Smoker   . Smokeless tobacco: Not on  file  . Alcohol Use: No      Allergies  Allergen Reactions  . Oxaliplatin Itching    Current Outpatient Prescriptions  Medication Sig Dispense Refill  . acetaminophen (TYLENOL) 500 MG tablet Take 500 mg by mouth every 6 (six) hours as needed.    . diphenoxylate-atropine (LOMOTIL) 2.5-0.025 MG per tablet Take 1 tablet by mouth 4 (four) times daily as needed for diarrhea or loose stools (2 tabs to start, then one by mouth every 6 hours if needed for diarrhea.). 30 tablet 0  . ibuprofen (ADVIL,MOTRIN) 800 MG tablet Take 800 mg by mouth 3 (three) times daily.    Marland Kitchen lidocaine (LIDODERM) 5 % Place 1 patch onto the skin daily. Remove & Discard patch within 12 hours or as directed by MD    . polyethylene glycol (MIRALAX / GLYCOLAX) packet Take 17 g by mouth daily.    . ferrous fumarate-iron polysaccharide complex (TANDEM) 162-115.2 MG CAPS Take 1 capsule by mouth 2 (two) times daily.    Marland Kitchen gabapentin (NEURONTIN) 300 MG capsule Take 300 mg by mouth 2 (two) times daily.    . methocarbamol (ROBAXIN) 750 MG tablet Take 750 mg by mouth 2 (two) times daily. 2 tabs    . pantoprazole (PROTONIX) 40 MG tablet Take 40 mg by mouth 2 (two) times daily.    . potassium chloride (K-DUR,KLOR-CON) 10 MEQ tablet 10 meq 2 times daily for 5 days.    . potassium chloride (KLOR-CON) 20 MEQ packet Take 20 mEq by mouth 3 (three) times daily. One packet in liquid of choice three times a day (Patient not taking: Reported on 03/08/2015) 30 packet 2  . promethazine (PHENERGAN) 25 MG tablet Take 1 tablet (25 mg total) by mouth every 6 (six) hours as needed for nausea or vomiting. (Patient not taking: Reported on 04/18/2015) 30 tablet 0  . sulfamethoxazole-trimethoprim (BACTRIM DS,SEPTRA DS) 800-160 MG per tablet Take 1 tablet by mouth 2 (two) times daily.    . traMADol (ULTRAM) 50 MG tablet Take 50 mg by mouth every 4 (four) hours.    . valACYclovir (VALTREX) 1000 MG tablet Take 1,000 mg by mouth 2 (two) times daily.     No  current facility-administered medications for this visit.   Facility-Administered Medications Ordered in Other Visits  Medication Dose Route Frequency Provider Last Rate Last Dose  . sodium chloride 0.9 % injection 10 mL  10 mL Intracatheter PRN [name], MD   10 mL at 03/21/15 1025    OBJECTIVE:  Filed Vitals:   04/18/15 0928  BP: 120/82  Pulse: 76  Temp: 98.1  F (36.7 C)     Body mass index is 34.57 kg/(m^2).    ECOG FS:0 - Asymptomatic  PHYSICAL EXAM: GENERAL:  Well developed, well nourished, sitting comfortably in the exam room in no acute distress. MENTAL STATUS:  Alert and oriented to person, place and time. HEAD:  .  Normocephalic, atraumatic, face symmetric, no Cushingoid features. EYES:   Pupils equal round and reactive to light and accomodation.  No conjunctivitis or scleral icterus. ENT:  Oropharynx clear without lesion.  Tongue normal. Mucous membranes moist.  RESPIRATORY:  Clear to auscultation without rales, wheezes or rhonchi. CARDIOVASCULAR:  Regular rate and rhythm without murmur, rub or gallop. BREAST:  Right breast without masses, skin changes or nipple discharge.  Left breast without masses, skin changes or nipple discharge. ABDOMEN:  Soft, non-tender, with active bowel sounds, and no hepatosplenomegaly.  No masses. BACK:  No CVA tenderness.  No tenderness on percussion of the back or rib cage. SKIN:  No rashes, ulcers or lesions. EXTREMITIES: No edema, no skin discoloration or tenderness.  No palpable cords. LYMPH NODES: No palpable cervical, supraclavicular, axillary or inguinal adenopathy  NEUROLOGICAL: Unremarkable. PSYCH:  Appropriate.   LAB RESULTS:  Appointment on 04/18/2015  Component Date Value Ref Range Status  . WBC 04/18/2015 6.6  3.6 - 11.0 K/uL Final  . RBC 04/18/2015 4.61  3.80 - 5.20 MIL/uL Final  . Hemoglobin 04/18/2015 13.9  12.0 - 16.0 g/dL Final  . HCT 90/91/2710 41.9  35.0 - 47.0 % Final  . MCV 04/18/2015 90.8  80.0 - 100.0 fL  Final  . MCH 04/18/2015 30.2  26.0 - 34.0 pg Final  . MCHC 04/18/2015 33.3  32.0 - 36.0 g/dL Final  . RDW 41/41/2055 16.8* 11.5 - 14.5 % Final  . Platelets 04/18/2015 147* 150 - 440 K/uL Final  . Neutrophils Relative % 04/18/2015 62   Final  . Neutro Abs 04/18/2015 4.1  1.4 - 6.5 K/uL Final  . Lymphocytes Relative 04/18/2015 27   Final  . Lymphs Abs 04/18/2015 1.7  1.0 - 3.6 K/uL Final  . Monocytes Relative 04/18/2015 9   Final  . Monocytes Absolute 04/18/2015 0.6  0.2 - 0.9 K/uL Final  . Eosinophils Relative 04/18/2015 1   Final  . Eosinophils Absolute 04/18/2015 0.1  0 - 0.7 K/uL Final  . Basophils Relative 04/18/2015 1   Final  . Basophils Absolute 04/18/2015 0.0  0 - 0.1 K/uL Final  . Sodium 04/18/2015 138  135 - 145 mmol/L Final  . Potassium 04/18/2015 3.4* 3.5 - 5.1 mmol/L Final  . Chloride 04/18/2015 111  101 - 111 mmol/L Final  . CO2 04/18/2015 23  22 - 32 mmol/L Final  . Glucose, Bld 04/18/2015 104* 65 - 99 mg/dL Final  . BUN 87/97/8491 7  6 - 20 mg/dL Final  . Creatinine, Ser 04/18/2015 0.57  0.44 - 1.00 mg/dL Final  . Calcium 47/60/7597 8.3* 8.9 - 10.3 mg/dL Final  . GFR calc non Af Amer 04/18/2015 >60  >60 mL/min Final  . GFR calc Af Amer 04/18/2015 >60  >60 mL/min Final   Comment: (NOTE) The eGFR has been calculated using the CKD EPI equation. This calculation has not been validated in all clinical situations. eGFR's persistently <60 mL/min signify possible Chronic Kidney Disease.   . Anion gap 04/18/2015 4* 5 - 15 Final  . Magnesium 04/18/2015 2.1  1.7 - 2.4 mg/dL Final    No results found for: LABCA2 Lab Results  Component Value Date  CA199 <1 02/03/2014   Lab Results  Component Value Date   CEA 5.0* 03/21/2015      ASSESSMENT: Stage IV carcinoma of colon Proceed with chemotherapy  MEDICAL DECISION MAKING:  PET scan has been reviewed independently.  Will continue chemotherapy.  There is no evidence of progressive disease is present time.  CEA is  pending   Patient expressed understanding and was in agreement with this plan. She also understands that She can call clinic at any time with any questions, concerns, or complaints.    Colon cancer   Staging form: Colon and Rectum, AJCC 7th Edition     Clinical: Stage IVB (yT3, N1, M1b) - Signed by [name], MD on 03/07/2015   [name], MD   04/18/2015 5:06 PM

## 2015-04-18 NOTE — Progress Notes (Signed)
Patient does not have living will.  Never smoked. 

## 2015-04-19 ENCOUNTER — Inpatient Hospital Stay: Payer: 59

## 2015-04-19 VITALS — BP 120/77 | HR 77 | Temp 96.9°F | Resp 18

## 2015-04-19 DIAGNOSIS — Z5111 Encounter for antineoplastic chemotherapy: Secondary | ICD-10-CM | POA: Diagnosis not present

## 2015-04-19 DIAGNOSIS — C189 Malignant neoplasm of colon, unspecified: Secondary | ICD-10-CM

## 2015-04-19 LAB — CEA: CEA: 4.2 ng/mL (ref 0.0–4.7)

## 2015-04-19 MED ORDER — SODIUM CHLORIDE 0.9 % IV SOLN
Freq: Once | INTRAVENOUS | Status: AC
  Administered 2015-04-19: 10:00:00 via INTRAVENOUS
  Filled 2015-04-19: qty 4

## 2015-04-19 MED ORDER — METHYLPREDNISOLONE SODIUM SUCC 125 MG IJ SOLR
40.0000 mg | Freq: Once | INTRAMUSCULAR | Status: AC
  Administered 2015-04-19: 40 mg via INTRAVENOUS

## 2015-04-19 MED ORDER — SODIUM CHLORIDE 0.9 % IV SOLN
380.0000 mg/m2 | Freq: Once | INTRAVENOUS | Status: AC
  Administered 2015-04-19: 750 mg via INTRAVENOUS
  Filled 2015-04-19: qty 15

## 2015-04-19 MED ORDER — FAMOTIDINE IN NACL 20-0.9 MG/50ML-% IV SOLN
20.0000 mg | Freq: Once | INTRAVENOUS | Status: AC
  Administered 2015-04-19: 20 mg via INTRAVENOUS
  Filled 2015-04-19: qty 50

## 2015-04-19 MED ORDER — SODIUM CHLORIDE 0.9 % IJ SOLN
10.0000 mL | INTRAMUSCULAR | Status: DC | PRN
  Administered 2015-04-19: 10 mL
  Filled 2015-04-19: qty 10

## 2015-04-19 MED ORDER — SODIUM CHLORIDE 0.9 % IV SOLN
1850.0000 mg/m2 | INTRAVENOUS | Status: DC
  Filled 2015-04-19: qty 72

## 2015-04-19 MED ORDER — DEXTROSE 5 % IV SOLN
Freq: Once | INTRAVENOUS | Status: AC
  Administered 2015-04-19: 09:00:00 via INTRAVENOUS
  Filled 2015-04-19: qty 1000

## 2015-04-19 MED ORDER — OXALIPLATIN CHEMO INJECTION FOR DESENSITIZATION 100 MG/20ML
85.0000 mg/m2 | Freq: Once | INTRAVENOUS | Status: AC
  Administered 2015-04-19: 165.75 mg via INTRAVENOUS
  Filled 2015-04-19: qty 33.15

## 2015-04-19 MED ORDER — METHYLPREDNISOLONE SODIUM SUCC 125 MG IJ SOLR
40.0000 mg | Freq: Once | INTRAMUSCULAR | Status: AC
  Administered 2015-04-19: 40 mg via INTRAVENOUS
  Filled 2015-04-19: qty 2

## 2015-04-19 MED ORDER — HEPARIN SOD (PORK) LOCK FLUSH 100 UNIT/ML IV SOLN: 500.0000 [IU] | Freq: Once | INTRAVENOUS | Status: DC | PRN

## 2015-04-19 MED ORDER — LEUCOVORIN CALCIUM INJECTION 350 MG
750.0000 mg | Freq: Once | INTRAVENOUS | Status: AC
  Administered 2015-04-19: 750 mg via INTRAVENOUS
  Filled 2015-04-19: qty 25

## 2015-04-19 MED ORDER — LORATADINE 10 MG PO TABS
10.0000 mg | ORAL_TABLET | Freq: Once | ORAL | Status: DC
  Filled 2015-04-19: qty 1

## 2015-04-19 MED ORDER — SODIUM CHLORIDE 0.9 % IV SOLN
1850.0000 mg/m2 | INTRAVENOUS | Status: DC
  Administered 2015-04-19: 3600 mg via INTRAVENOUS
  Filled 2015-04-19: qty 72

## 2015-04-19 MED ORDER — DIPHENHYDRAMINE HCL 50 MG/ML IJ SOLN: 50.0000 mg | Freq: Once | INTRAMUSCULAR | Status: DC

## 2015-04-19 MED ORDER — DEXTROSE 5 % IV SOLN
85.0000 mg/m2 | Freq: Once | INTRAVENOUS | Status: AC
  Administered 2015-04-19: 165.75 mg via INTRAVENOUS
  Filled 2015-04-19: qty 33.15

## 2015-04-19 MED ORDER — DIPHENHYDRAMINE HCL 50 MG/ML IJ SOLN
50.0000 mg | Freq: Once | INTRAMUSCULAR | Status: AC
  Administered 2015-04-19: 50 mg via INTRAVENOUS
  Filled 2015-04-19: qty 1

## 2015-04-19 MED ORDER — METHYLPREDNISOLONE SODIUM SUCC 125 MG IJ SOLR
INTRAMUSCULAR | Status: AC
  Filled 2015-04-19: qty 2

## 2015-04-21 ENCOUNTER — Ambulatory Visit: Payer: 59 | Admitting: *Deleted

## 2015-04-21 DIAGNOSIS — C189 Malignant neoplasm of colon, unspecified: Secondary | ICD-10-CM

## 2015-04-21 DIAGNOSIS — Z5111 Encounter for antineoplastic chemotherapy: Secondary | ICD-10-CM | POA: Diagnosis not present

## 2015-04-21 MED ORDER — HEPARIN SOD (PORK) LOCK FLUSH 100 UNIT/ML IV SOLN
500.0000 [IU] | Freq: Once | INTRAVENOUS | Status: AC
  Administered 2015-04-21: 500 [IU] via INTRAVENOUS

## 2015-04-21 MED ORDER — SODIUM CHLORIDE 0.9 % IJ SOLN
10.0000 mL | INTRAMUSCULAR | Status: AC | PRN
  Administered 2015-04-21: 10 mL via INTRAVENOUS
  Filled 2015-04-21: qty 10

## 2015-05-02 ENCOUNTER — Inpatient Hospital Stay (HOSPITAL_BASED_OUTPATIENT_CLINIC_OR_DEPARTMENT_OTHER): Payer: 59 | Admitting: Oncology

## 2015-05-02 ENCOUNTER — Encounter: Payer: Self-pay | Admitting: Oncology

## 2015-05-02 ENCOUNTER — Inpatient Hospital Stay: Payer: 59 | Attending: Oncology

## 2015-05-02 VITALS — BP 102/71 | HR 89 | Temp 97.7°F | Wt 195.8 lb

## 2015-05-02 DIAGNOSIS — C787 Secondary malignant neoplasm of liver and intrahepatic bile duct: Secondary | ICD-10-CM | POA: Diagnosis not present

## 2015-05-02 DIAGNOSIS — C185 Malignant neoplasm of splenic flexure: Secondary | ICD-10-CM

## 2015-05-02 DIAGNOSIS — C189 Malignant neoplasm of colon, unspecified: Secondary | ICD-10-CM

## 2015-05-02 DIAGNOSIS — Z79899 Other long term (current) drug therapy: Secondary | ICD-10-CM | POA: Diagnosis not present

## 2015-05-02 DIAGNOSIS — Z5111 Encounter for antineoplastic chemotherapy: Secondary | ICD-10-CM | POA: Diagnosis not present

## 2015-05-02 DIAGNOSIS — Z888 Allergy status to other drugs, medicaments and biological substances status: Secondary | ICD-10-CM

## 2015-05-02 LAB — CBC WITH DIFFERENTIAL/PLATELET
Basophils Absolute: 0 10*3/uL (ref 0–0.1)
Basophils Relative: 1 %
EOS PCT: 2 %
Eosinophils Absolute: 0.1 10*3/uL (ref 0–0.7)
HEMATOCRIT: 39.1 % (ref 35.0–47.0)
HEMOGLOBIN: 13 g/dL (ref 12.0–16.0)
LYMPHS PCT: 29 %
Lymphs Abs: 1.2 10*3/uL (ref 1.0–3.6)
MCH: 30.2 pg (ref 26.0–34.0)
MCHC: 33.2 g/dL (ref 32.0–36.0)
MCV: 90.9 fL (ref 80.0–100.0)
MONO ABS: 0.4 10*3/uL (ref 0.2–0.9)
MONOS PCT: 11 %
Neutro Abs: 2.4 10*3/uL (ref 1.4–6.5)
Neutrophils Relative %: 57 %
Platelets: 159 10*3/uL (ref 150–440)
RBC: 4.3 MIL/uL (ref 3.80–5.20)
RDW: 16.8 % — ABNORMAL HIGH (ref 11.5–14.5)
WBC: 4.2 10*3/uL (ref 3.6–11.0)

## 2015-05-02 LAB — BASIC METABOLIC PANEL
Anion gap: 5 (ref 5–15)
BUN: 7 mg/dL (ref 6–20)
CALCIUM: 8.5 mg/dL — AB (ref 8.9–10.3)
CO2: 23 mmol/L (ref 22–32)
CREATININE: 0.63 mg/dL (ref 0.44–1.00)
Chloride: 111 mmol/L (ref 101–111)
GFR calc Af Amer: >60 mL/min (ref >60)
Glucose, Bld: 100 mg/dL — ABNORMAL HIGH (ref 65–99)
Potassium: 3.4 mmol/L — ABNORMAL LOW (ref 3.5–5.1)
Sodium: 139 mmol/L (ref 135–145)

## 2015-05-02 LAB — MAGNESIUM: Magnesium: 2 mg/dL (ref 1.7–2.4)

## 2015-05-02 NOTE — Progress Notes (Signed)
Patient does not have living will.  Never smoked. 

## 2015-05-02 NOTE — Progress Notes (Signed)
[place] @ [facility] Telephone:(336) [phone]  Fax:(336) [place]: 12/31/1970  MR#: [id number]  JOI#:[id number]  Patient Care Team: Lynnell Jude, MD as PCP - General (Family Medicine)  CHIEF COMPLAINT:  Chief Complaint  Patient presents with  . Follow-up    Oncology History   1. Carcinoma of the colon, splenic  flexure.   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 2. K-RAS mutated. 3. Patient started on FOLFOX and Avastin 4 th June, 2015 4. Allergic reaction  characterized by generalized itching and redness  of face and palm on June 15, 2014 5. Started on FOLFIRI  and avastin   July 13, 2014 6. Finished 12 cycles of chemotherapy, 5-FU based FOLFOX was changed to FOLFIRI BECAUSE OF SENSITIVITY TO OXALIPLATIN(September 07, 2014) 7. Started on maintenance chemotherapy with 5-FU leucovorin and Avastin from September 28, 2014 8.  Progressive disease based on tumor markers as well as PET scan's so patient was started on FOLFOX chemotherapy with desensitizing  protocol.     Colon cancer   02/10/2014 Initial Diagnosis Colon cancer    Oncology Flowsheet 03/22/2015 03/22/2015 04/19/2015 04/19/2015 04/19/2015 04/19/2015 04/19/2015  Day, Cycle     Day 1, Cycle 4          dexamethasone (DECADRON) IV     [ 10 mg ]          fluorouracil (ADRUCIL) IV     380 mg/m2 1,850 mg/m2        fosaprepitant (EMEND) IV     - - - - -  leucovorin IV     750 mg          methylPREDNISolone sodium succinate 125 mg/2 mL (SOLU-MEDROL) IV 40 mg 40 mg 40 mg 40 mg 40 mg 40 mg 40 mg  ondansetron (ZOFRAN) IV - - [ 8 mg ]          oxaliplatin (ELOXATIN) IV 85 mg/m2 85 mg/m2 85 mg/m2 85 mg/m2 85 mg/m2 85 mg/m2 85 mg/m2  palonosetron (ALOXI) IV     - - - - -    INTERVAL HISTORY: 44 year old lady with stage IV carcinoma of colon came today further follow-up.   Patient is tolerating oxaliplatin with  desensitizing protocol.  No chills.  No fever.  Tingling numbness in upper and lower extremity is intermittent.  Does not impair with motor function No nausea no vomiting Here for further follow-up and treatment consideration. REVIEW OF SYSTEMS:   GENERAL:  Feels good.  Active.  No fevers, sweats or weight loss. PERFORMANCE STATUS (ECOG): 0 HEENT:  No visual changes, runny nose, sore throat, mouth sores or tenderness. Lungs: No shortness of breath or cough.  No hemoptysis. Cardiac:  No chest pain, palpitations, orthopnea, or PND. GI:  No nausea, vomiting, diarrhea, constipation, melena or hematochezia. GU:  No urgency, frequency, dysuria, or hematuria. Musculoskeletal:  No back pain.  No joint pain.  No muscle tenderness. Extremities:  No pain or swelling. Skin:  No rashes or skin changes. Neuro:  No headache, numbness or weakness, balance or coordination issues. Endocrine:  No diabetes, thyroid issues, hot flashes or night sweats. Psych:  No mood changes, depression or anxiety. Pain:  No focal pain. Review of systems:  All other systems reviewed and found to be negative.  As per HPI. Otherwise, a complete review of systems is negatve.  PAST  MEDICAL HISTORY: Past Medical History  Diagnosis Date  . Ulcer   . Anemia   . Cancer 2015    colon  . 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 On adjuvant chemotherapy  . Obesity   . Constipation   . Migraines     PAST SURGICAL HISTORY: Past Surgical History  Procedure Laterality Date  . Cholecystectomy  2014  . Tubal ligation  2005  . Colonoscopy  2015  . Upper gi endoscopy  2015    FAMILY HISTORY No significant family history of malignancy       ADVANCED DIRECTIVES:   Patient does have advanced healthcare directive HEALTH MAINTENANCE: History  Substance Use Topics  . Smoking status: Never Smoker   . Smokeless tobacco: Not on file  . Alcohol Use: No       Allergies  Allergen Reactions  . Oxaliplatin Itching    Current Outpatient Prescriptions  Medication Sig Dispense Refill  . acetaminophen (TYLENOL) 500 MG tablet Take 500 mg by mouth every 6 (six) hours as needed.    . diphenoxylate-atropine (LOMOTIL) 2.5-0.025 MG per tablet Take 1 tablet by mouth 4 (four) times daily as needed for diarrhea or loose stools (2 tabs to start, then one by mouth every 6 hours if needed for diarrhea.). 30 tablet 0  . ferrous fumarate-iron polysaccharide complex (TANDEM) 162-115.2 MG CAPS Take 1 capsule by mouth 2 (two) times daily.    Marland Kitchen gabapentin (NEURONTIN) 300 MG capsule Take 300 mg by mouth 2 (two) times daily.    Marland Kitchen ibuprofen (ADVIL,MOTRIN) 800 MG tablet Take 800 mg by mouth 3 (three) times daily.    Marland Kitchen lidocaine (LIDODERM) 5 % Place 1 patch onto the skin daily. Remove & Discard patch within 12 hours or as directed by MD    . methocarbamol (ROBAXIN) 750 MG tablet Take 750 mg by mouth 2 (two) times daily. 2 tabs    . pantoprazole (PROTONIX) 40 MG tablet Take 40 mg by mouth 2 (two) times daily.    . polyethylene glycol (MIRALAX / GLYCOLAX) packet Take 17 g by mouth daily.    . potassium chloride (K-DUR,KLOR-CON) 10 MEQ tablet 10 meq 2 times daily for 5 days.    . potassium chloride (KLOR-CON) 20 MEQ packet Take 20 mEq by mouth 3 (three) times daily. One packet in liquid of choice three times a day 30 packet 2  . promethazine (PHENERGAN) 25 MG tablet Take 1 tablet (25 mg total) by mouth every 6 (six) hours as needed for nausea or vomiting. 30 tablet 0  . sulfamethoxazole-trimethoprim (BACTRIM DS,SEPTRA DS) 800-160 MG per tablet Take 1 tablet by mouth 2 (two) times daily.    . traMADol (ULTRAM) 50 MG tablet Take 50 mg by mouth every 4 (four) hours.    . valACYclovir (VALTREX) 1000 MG tablet Take 1,000 mg by mouth 2 (two) times daily.     No current facility-administered medications for this visit.   Facility-Administered Medications Ordered in Other  Visits  Medication Dose Route Frequency Provider Last Rate Last Dose  . sodium chloride 0.9 % injection 10 mL  10 mL Intracatheter PRN [name], MD   10 mL at 03/21/15 1025  . sodium chloride 0.9 % injection 10 mL  10 mL Intravenous PRN [name], MD   10 mL at 04/21/15 1025    OBJECTIVE:  Filed Vitals:   05/02/15 1015  BP: 102/71  Pulse: 89  Temp: 97.7 F (36.5 C)     Body mass index is 34.69 kg/(m^2).    ECOG FS:0 - Asymptomatic  PHYSICAL EXAM: GENERAL:  Well developed, well nourished, sitting comfortably in the exam room in no acute distress. MENTAL STATUS:  Alert and oriented to person, place and time. HEAD:  .  Normocephalic, atraumatic, face symmetric, no Cushingoid features. EYES:   Pupils equal round and reactive to light and accomodation.  No conjunctivitis or scleral icterus. ENT:  Oropharynx clear without lesion.  Tongue normal. Mucous membranes moist.  RESPIRATORY:  Clear to auscultation without rales, wheezes or rhonchi. CARDIOVASCULAR:  Regular rate and rhythm without murmur, rub or gallop. BREAST:  Right breast without masses, skin changes or nipple discharge.  Left breast without masses, skin changes or nipple discharge. ABDOMEN:  Soft, non-tender, with active bowel sounds, and no hepatosplenomegaly.  No masses. BACK:  No CVA tenderness.  No tenderness on percussion of the back or rib cage. SKIN:  No rashes, ulcers or lesions. EXTREMITIES: No edema, no skin discoloration or tenderness.  No palpable cords. LYMPH NODES: No palpable cervical, supraclavicular, axillary or inguinal adenopathy  NEUROLOGICAL: Unremarkable. PSYCH:  Appropriate.   LAB RESULTS:  Appointment on 05/02/2015  Component Date Value Ref Range Status  . WBC 05/02/2015 4.2  3.6 - 11.0 K/uL Final  . RBC 05/02/2015 4.30  3.80 - 5.20 MIL/uL Final  . Hemoglobin 05/02/2015 13.0  12.0 - 16.0 g/dL Final  . HCT 05/02/2015 39.1  35.0 - 47.0 % Final  . MCV 05/02/2015 90.9  80.0 - 100.0 fL Final   . MCH 05/02/2015 30.2  26.0 - 34.0 pg Final  . MCHC 05/02/2015 33.2  32.0 - 36.0 g/dL Final  . RDW 05/02/2015 16.8* 11.5 - 14.5 % Final  . Platelets 05/02/2015 159  150 - 440 K/uL Final  . Neutrophils Relative % 05/02/2015 57   Final  . Neutro Abs 05/02/2015 2.4  1.4 - 6.5 K/uL Final  . Lymphocytes Relative 05/02/2015 29   Final  . Lymphs Abs 05/02/2015 1.2  1.0 - 3.6 K/uL Final  . Monocytes Relative 05/02/2015 11   Final  . Monocytes Absolute 05/02/2015 0.4  0.2 - 0.9 K/uL Final  . Eosinophils Relative 05/02/2015 2   Final  . Eosinophils Absolute 05/02/2015 0.1  0 - 0.7 K/uL Final  . Basophils Relative 05/02/2015 1   Final  . Basophils Absolute 05/02/2015 0.0  0 - 0.1 K/uL Final  . Sodium 05/02/2015 139  135 - 145 mmol/L Final  . Potassium 05/02/2015 3.4* 3.5 - 5.1 mmol/L Final  . Chloride 05/02/2015 111  101 - 111 mmol/L Final  . CO2 05/02/2015 23  22 - 32 mmol/L Final  . Glucose, Bld 05/02/2015 100* 65 - 99 mg/dL Final  . BUN 05/02/2015 7  6 - 20 mg/dL Final  . Creatinine, Ser 05/02/2015 0.63  0.44 - 1.00 mg/dL Final  . Calcium 05/02/2015 8.5* 8.9 - 10.3 mg/dL Final  . GFR calc non Af Amer 05/02/2015 >60  >60 mL/min Final  . GFR calc Af Amer 05/02/2015 >60  >60 mL/min Final   Comment: (NOTE) The eGFR has been calculated using the CKD EPI equation. This calculation has not been validated in all clinical situations. eGFR's persistently <60 mL/min signify possible Chronic Kidney Disease.   . Anion gap 05/02/2015 5  5 - 15 Final  . Magnesium 05/02/2015 2.0  1.7 - 2.4 mg/dL Final    No results found for: LABCA2 Lab Results  Component Value Date   CA199 <1 02/03/2014   Lab Results  Component Value Date   CEA 4.2 04/18/2015      ASSESSMENT: Stage IV carcinoma of colon Proceed with chemotherapy  MEDICAL DECISION MAKING:   Lab Results  Component Value Date   CEA 4.2 04/18/2015    Patient expressed understanding and was in agreement with this plan. She also  understands that She can call clinic at any time with any questions, concerns, or complaints.   CEA slowly declining Continue  chemotherapy Colon cancer   Staging form: Colon and Rectum, AJCC 7th Edition     Clinical: Stage IVB (yT3, N1, M1b) - Signed by [name], MD on 03/07/2015   [name], MD   05/02/2015 4:47 PM

## 2015-05-03 ENCOUNTER — Inpatient Hospital Stay: Payer: 59

## 2015-05-03 DIAGNOSIS — C185 Malignant neoplasm of splenic flexure: Secondary | ICD-10-CM | POA: Diagnosis not present

## 2015-05-03 DIAGNOSIS — C189 Malignant neoplasm of colon, unspecified: Secondary | ICD-10-CM

## 2015-05-03 MED ORDER — METHYLPREDNISOLONE SODIUM SUCC 125 MG IJ SOLR
40.0000 mg | Freq: Once | INTRAMUSCULAR | Status: AC
  Administered 2015-05-03: 40 mg via INTRAVENOUS

## 2015-05-03 MED ORDER — DIPHENHYDRAMINE HCL 50 MG/ML IJ SOLN
50.0000 mg | Freq: Once | INTRAMUSCULAR | Status: AC
  Administered 2015-05-03: 50 mg via INTRAVENOUS
  Filled 2015-05-03: qty 1

## 2015-05-03 MED ORDER — DEXTROSE 5 % IV SOLN
750.0000 mg | Freq: Once | INTRAVENOUS | Status: AC
  Administered 2015-05-03: 750 mg via INTRAVENOUS
  Filled 2015-05-03: qty 25

## 2015-05-03 MED ORDER — DEXTROSE 5 % IV SOLN
Freq: Once | INTRAVENOUS | Status: AC
  Administered 2015-05-03: 09:00:00 via INTRAVENOUS
  Filled 2015-05-03: qty 1000

## 2015-05-03 MED ORDER — OXALIPLATIN CHEMO INJECTION FOR DESENSITIZATION 100 MG/20ML
165.7500 mg | Freq: Once | INTRAVENOUS | Status: AC
  Administered 2015-05-03: 165.75 mg via INTRAVENOUS
  Filled 2015-05-03: qty 33.15

## 2015-05-03 MED ORDER — HEPARIN SOD (PORK) LOCK FLUSH 100 UNIT/ML IV SOLN: 500.0000 [IU] | Freq: Once | INTRAVENOUS | Status: DC | PRN

## 2015-05-03 MED ORDER — HEPARIN SOD (PORK) LOCK FLUSH 100 UNIT/ML IV SOLN: 250.0000 [IU] | Freq: Once | INTRAVENOUS | Status: DC | PRN

## 2015-05-03 MED ORDER — METHYLPREDNISOLONE SODIUM SUCC 125 MG IJ SOLR
INTRAMUSCULAR | Status: AC
  Filled 2015-05-03: qty 6

## 2015-05-03 MED ORDER — SODIUM CHLORIDE 0.9 % IV SOLN
Freq: Once | INTRAVENOUS | Status: AC
  Administered 2015-05-03: 10:00:00 via INTRAVENOUS
  Filled 2015-05-03: qty 4

## 2015-05-03 MED ORDER — SODIUM CHLORIDE 0.9 % IV SOLN
3600.0000 mg | INTRAVENOUS | Status: DC
  Administered 2015-05-03: 3600 mg via INTRAVENOUS
  Filled 2015-05-03: qty 72

## 2015-05-03 MED ORDER — ALTEPLASE 2 MG IJ SOLR: 2.0000 mg | Freq: Once | INTRAMUSCULAR | Status: DC | PRN

## 2015-05-03 MED ORDER — SODIUM CHLORIDE 0.9 % IJ SOLN
10.0000 mL | INTRAMUSCULAR | Status: DC | PRN
  Filled 2015-05-03: qty 10

## 2015-05-03 MED ORDER — SODIUM CHLORIDE 0.9 % IJ SOLN
3.0000 mL | INTRAMUSCULAR | Status: DC | PRN
  Filled 2015-05-03: qty 10

## 2015-05-03 MED ORDER — FLUOROURACIL CHEMO INJECTION 2.5 GM/50ML
750.0000 mg | Freq: Once | INTRAVENOUS | Status: AC
  Administered 2015-05-03: 750 mg via INTRAVENOUS
  Filled 2015-05-03: qty 15

## 2015-05-03 MED ORDER — FAMOTIDINE IN NACL 20-0.9 MG/50ML-% IV SOLN
20.0000 mg | Freq: Two times a day (BID) | INTRAVENOUS | Status: AC
  Administered 2015-05-03: 20 mg via INTRAVENOUS
  Filled 2015-05-03: qty 50

## 2015-05-03 MED ORDER — DEXTROSE 5 % IV SOLN
165.7500 mg | Freq: Once | INTRAVENOUS | Status: AC
  Administered 2015-05-03: 165.75 mg via INTRAVENOUS
  Filled 2015-05-03: qty 33.15

## 2015-05-05 ENCOUNTER — Inpatient Hospital Stay: Payer: 59

## 2015-05-05 VITALS — BP 106/70 | HR 88 | Temp 97.4°F | Resp 18

## 2015-05-05 DIAGNOSIS — C189 Malignant neoplasm of colon, unspecified: Secondary | ICD-10-CM

## 2015-05-05 DIAGNOSIS — C185 Malignant neoplasm of splenic flexure: Secondary | ICD-10-CM | POA: Diagnosis not present

## 2015-05-05 MED ORDER — HEPARIN SOD (PORK) LOCK FLUSH 100 UNIT/ML IV SOLN
500.0000 [IU] | Freq: Once | INTRAVENOUS | Status: AC
  Administered 2015-05-05: 500 [IU] via INTRAVENOUS

## 2015-05-05 MED ORDER — SODIUM CHLORIDE 0.9 % IJ SOLN
10.0000 mL | INTRAMUSCULAR | Status: DC | PRN
  Administered 2015-05-05: 10 mL via INTRAVENOUS
  Filled 2015-05-05: qty 10

## 2015-05-23 ENCOUNTER — Encounter: Payer: Self-pay | Admitting: Oncology

## 2015-05-23 ENCOUNTER — Inpatient Hospital Stay (HOSPITAL_BASED_OUTPATIENT_CLINIC_OR_DEPARTMENT_OTHER): Payer: 59 | Admitting: Oncology

## 2015-05-23 ENCOUNTER — Inpatient Hospital Stay: Payer: 59

## 2015-05-23 VITALS — BP 112/78 | HR 81 | Temp 98.3°F | Wt 200.0 lb

## 2015-05-23 DIAGNOSIS — Z79899 Other long term (current) drug therapy: Secondary | ICD-10-CM | POA: Diagnosis not present

## 2015-05-23 DIAGNOSIS — C189 Malignant neoplasm of colon, unspecified: Secondary | ICD-10-CM

## 2015-05-23 DIAGNOSIS — C185 Malignant neoplasm of splenic flexure: Secondary | ICD-10-CM | POA: Diagnosis not present

## 2015-05-23 DIAGNOSIS — Z888 Allergy status to other drugs, medicaments and biological substances status: Secondary | ICD-10-CM

## 2015-05-23 LAB — CBC WITH DIFFERENTIAL/PLATELET
Basophils Absolute: 0 10*3/uL (ref 0–0.1)
Basophils Relative: 1 %
EOS ABS: 0.1 10*3/uL (ref 0–0.7)
EOS PCT: 2 %
HEMATOCRIT: 37.8 % (ref 35.0–47.0)
Hemoglobin: 12.5 g/dL (ref 12.0–16.0)
LYMPHS ABS: 1.5 10*3/uL (ref 1.0–3.6)
Lymphocytes Relative: 43 %
MCH: 30.6 pg (ref 26.0–34.0)
MCHC: 33.2 g/dL (ref 32.0–36.0)
MCV: 92.1 fL (ref 80.0–100.0)
MONOS PCT: 16 %
Monocytes Absolute: 0.6 10*3/uL (ref 0.2–0.9)
NEUTROS ABS: 1.3 10*3/uL — AB (ref 1.4–6.5)
Neutrophils Relative %: 38 %
Platelets: 174 10*3/uL (ref 150–440)
RBC: 4.1 MIL/uL (ref 3.80–5.20)
RDW: 16 % — AB (ref 11.5–14.5)
WBC: 3.4 10*3/uL — AB (ref 3.6–11.0)

## 2015-05-23 LAB — COMPREHENSIVE METABOLIC PANEL
ALBUMIN: 3.5 g/dL (ref 3.5–5.0)
ALT: 13 U/L — AB (ref 14–54)
AST: 32 U/L (ref 15–41)
Alkaline Phosphatase: 70 U/L (ref 38–126)
Anion gap: 6 (ref 5–15)
BILIRUBIN TOTAL: 0.7 mg/dL (ref 0.3–1.2)
BUN: 7 mg/dL (ref 6–20)
CHLORIDE: 109 mmol/L (ref 101–111)
CO2: 22 mmol/L (ref 22–32)
CREATININE: 0.71 mg/dL (ref 0.44–1.00)
Calcium: 8.3 mg/dL — ABNORMAL LOW (ref 8.9–10.3)
GFR calc non Af Amer: >60 mL/min (ref >60)
Glucose, Bld: 122 mg/dL — ABNORMAL HIGH (ref 65–99)
POTASSIUM: 3.1 mmol/L — AB (ref 3.5–5.1)
SODIUM: 137 mmol/L (ref 135–145)
Total Protein: 6.6 g/dL (ref 6.5–8.1)

## 2015-05-23 NOTE — Progress Notes (Signed)
Patient does not have living will.  Never smoked. 

## 2015-05-23 NOTE — Progress Notes (Signed)
[place] @ [facility] Telephone:(336) [phone]  Fax:(336) [place]: 07-28-1971  MR#: [id number]  FXT#:[id number]  Patient Care Team: Lynnell Jude, MD as PCP - General (Family Medicine)  CHIEF COMPLAINT:  Chief Complaint  Patient presents with  . Follow-up    Oncology History   1. Carcinoma of the colon, splenic  flexure.   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 2. K-RAS mutated. 3. Patient started on FOLFOX and Avastin 4 th June, 2015 4. Allergic reaction  characterized by generalized itching and redness  of face and palm on June 15, 2014 5. Started on FOLFIRI  and avastin   July 13, 2014 6. Finished 12 cycles of chemotherapy, 5-FU based FOLFOX was changed to FOLFIRI BECAUSE OF SENSITIVITY TO OXALIPLATIN(September 07, 2014) 7. Started on maintenance chemotherapy with 5-FU leucovorin and Avastin from September 28, 2014 8.  Progressive disease based on tumor markers as well as PET scan's so patient was started on FOLFOX chemotherapy with desensitizing  protocol.     Colon cancer   02/10/2014 Initial Diagnosis Colon cancer    Oncology Flowsheet 04/19/2015 04/19/2015 05/03/2015 05/03/2015 05/03/2015 05/03/2015 05/03/2015  Day, Cycle     Day 1, Cycle 1          dexamethasone (DECADRON) IV     [ 10 mg ]          fluorouracil (ADRUCIL) IV     750 mg 3,600 mg        fosaprepitant (EMEND) IV - - - - - - -  leucovorin IV     750 mg          methylPREDNISolone sodium succinate 125 mg/2 mL (SOLU-MEDROL) IV 40 _0  mg 40 mg  ondansetron (ZOFRAN) IV     [ 8 mg ]          oxaliplatin (ELOXATIN) IV 85 mg/m2 85 mg/m2 165.75 mg 165.75 mg 165.75 mg 165.75 mg 165.75 mg  palonosetron (ALOXI) IV - - - - - - -    INTERVAL HISTORY: 44 year old lady with stage IV carcinoma of colon came today further follow-up.   Patient is tolerating oxaliplatin with  desensitizing protocol.  No chills.  No fever.  Tingling numbness in upper and lower extremity is intermittent.  Does not impair with motor function No nausea no vomiting Here for further follow-up and treatment consideration. May 23, 2015 Patient is here for ongoing evaluation and consideration of chemotherapy.  Tolerating treatment very well.  No tingling.  No numbness.  No abdominal pain. CEA remains stable. PET scan was done in June was showing no evidence of progressive disease  REVIEW OF SYSTEMS:   GENERAL:  Feels good.  Active.  No fevers, sweats or weight loss. PERFORMANCE STATUS (ECOG): 0 HEENT:  No visual changes, runny nose, sore throat, mouth sores or tenderness. Lungs: No shortness of breath or cough.  No hemoptysis. Cardiac:  No chest pain, palpitations, orthopnea, or PND. GI:  No nausea, vomiting, diarrhea, constipation, melena or hematochezia. GU:  No urgency, frequency, dysuria, or hematuria. Musculoskeletal:  No back pain.  No joint pain.  No muscle tenderness. Extremities:  No pain or swelling. Skin:  No rashes or skin changes. Neuro:  No headache, numbness or weakness, balance or coordination issues. Endocrine:  No diabetes, thyroid issues, hot flashes  or night sweats. Psych:  No mood changes, depression or anxiety. Pain:  No focal pain. Review of systems:  All other systems reviewed and found to be negative.  As per HPI. Otherwise, a complete review of systems is negatve.  PAST MEDICAL HISTORY: Past Medical History  Diagnosis Date  . Ulcer   . Anemia   . Cancer 2015    colon  . 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 On adjuvant chemotherapy  . Obesity   . Constipation   . Migraines     PAST SURGICAL HISTORY: Past Surgical History  Procedure Laterality Date  . Cholecystectomy  2014  . Tubal ligation  2005  . Colonoscopy  2015  . Upper gi endoscopy  2015    FAMILY HISTORY No  significant family history of malignancy       ADVANCED DIRECTIVES:   Patient does have advanced healthcare directive HEALTH MAINTENANCE: History  Substance Use Topics  . Smoking status: Never Smoker   . Smokeless tobacco: Not on file  . Alcohol Use: No      Allergies  Allergen Reactions  . Oxaliplatin Itching    Current Outpatient Prescriptions  Medication Sig Dispense Refill  . acetaminophen (TYLENOL) 500 MG tablet Take 500 mg by mouth every 6 (six) hours as needed.    . diphenoxylate-atropine (LOMOTIL) 2.5-0.025 MG per tablet Take 1 tablet by mouth 4 (four) times daily as needed for diarrhea or loose stools (2 tabs to start, then one by mouth every 6 hours if needed for diarrhea.). 30 tablet 0  . ferrous fumarate-iron polysaccharide complex (TANDEM) 162-115.2 MG CAPS Take 1 capsule by mouth 2 (two) times daily.    Marland Kitchen gabapentin (NEURONTIN) 300 MG capsule Take 300 mg by mouth 2 (two) times daily.    Marland Kitchen ibuprofen (ADVIL,MOTRIN) 800 MG tablet Take 800 mg by mouth 3 (three) times daily.    Marland Kitchen lidocaine (LIDODERM) 5 % Place 1 patch onto the skin daily. Remove & Discard patch within 12 hours or as directed by MD    . methocarbamol (ROBAXIN) 750 MG tablet Take 750 mg by mouth 2 (two) times daily. 2 tabs    . pantoprazole (PROTONIX) 40 MG tablet Take 40 mg by mouth 2 (two) times daily.    . polyethylene glycol (MIRALAX / GLYCOLAX) packet Take 17 g by mouth daily.    . potassium chloride (K-DUR,KLOR-CON) 10 MEQ tablet 10 meq 2 times daily for 5 days.    . potassium chloride (KLOR-CON) 20 MEQ packet Take 20 mEq by mouth 3 (three) times daily. One packet in liquid of choice three times a day 30 packet 2  . promethazine (PHENERGAN) 25 MG tablet Take 1 tablet (25 mg total) by mouth every 6 (six) hours as needed for nausea or vomiting. 30 tablet 0  . sulfamethoxazole-trimethoprim (BACTRIM DS,SEPTRA DS) 800-160 MG per tablet Take 1 tablet by mouth 2 (two) times daily.    . traMADol (ULTRAM) 50  MG tablet Take 50 mg by mouth every 4 (four) hours.    . valACYclovir (VALTREX) 1000 MG tablet Take 1,000 mg by mouth 2 (two) times daily.     No current facility-administered medications for this visit.   Facility-Administered Medications Ordered in Other Visits  Medication Dose Route Frequency Provider Last Rate Last Dose  . sodium chloride 0.9 % injection 10 mL  10 mL Intracatheter PRN [name], MD   10 mL at  03/21/15 1025  . sodium chloride 0.9 % injection 10 mL  10 mL Intravenous PRN [name], MD   10 mL at 04/21/15 1025    OBJECTIVE:  Filed Vitals:   05/23/15 1015  BP: 112/78  Pulse: 81  Temp: 98.3 F (36.8 C)     Body mass index is 35.43 kg/(m^2).    ECOG FS:0 - Asymptomatic  PHYSICAL EXAM: GENERAL:  Well developed, well nourished, sitting comfortably in the exam room in no acute distress. MENTAL STATUS:  Alert and oriented to person, place and time. HEAD:  .  Normocephalic, atraumatic, face symmetric, no Cushingoid features. EYES:   Pupils equal round and reactive to light and accomodation.  No conjunctivitis or scleral icterus. ENT:  Oropharynx clear without lesion.  Tongue normal. Mucous membranes moist.  RESPIRATORY:  Clear to auscultation without rales, wheezes or rhonchi. CARDIOVASCULAR:  Regular rate and rhythm without murmur, rub or gallop. BREAST:  Right breast without masses, skin changes or nipple discharge.  Left breast without masses, skin changes or nipple discharge. ABDOMEN:  Soft, non-tender, with active bowel sounds, and no hepatosplenomegaly.  No masses. BACK:  No CVA tenderness.  No tenderness on percussion of the back or rib cage. SKIN:  No rashes, ulcers or lesions. EXTREMITIES: No edema, no skin discoloration or tenderness.  No palpable cords. LYMPH NODES: No palpable cervical, supraclavicular, axillary or inguinal adenopathy  NEUROLOGICAL: Unremarkable. PSYCH:  Appropriate.   LAB RESULTS:  Appointment on 05/23/2015  Component Date Value  Ref Range Status  . WBC 05/23/2015 3.4* 3.6 - 11.0 K/uL Final  . RBC 05/23/2015 4.10  3.80 - 5.20 MIL/uL Final  . Hemoglobin 05/23/2015 12.5  12.0 - 16.0 g/dL Final  . HCT 05/23/2015 37.8  35.0 - 47.0 % Final  . MCV 05/23/2015 92.1  80.0 - 100.0 fL Final  . MCH 05/23/2015 30.6  26.0 - 34.0 pg Final  . MCHC 05/23/2015 33.2  32.0 - 36.0 g/dL Final  . RDW 05/23/2015 16.0* 11.5 - 14.5 % Final  . Platelets 05/23/2015 174  150 - 440 K/uL Final  . Neutrophils Relative % 05/23/2015 38   Final  . Neutro Abs 05/23/2015 1.3* 1.4 - 6.5 K/uL Final  . Lymphocytes Relative 05/23/2015 43   Final  . Lymphs Abs 05/23/2015 1.5  1.0 - 3.6 K/uL Final  . Monocytes Relative 05/23/2015 16   Final  . Monocytes Absolute 05/23/2015 0.6  0.2 - 0.9 K/uL Final  . Eosinophils Relative 05/23/2015 2   Final  . Eosinophils Absolute 05/23/2015 0.1  0 - 0.7 K/uL Final  . Basophils Relative 05/23/2015 1   Final  . Basophils Absolute 05/23/2015 0.0  0 - 0.1 K/uL Final  . Sodium 05/23/2015 137  135 - 145 mmol/L Final  . Potassium 05/23/2015 3.1* 3.5 - 5.1 mmol/L Final  . Chloride 05/23/2015 109  101 - 111 mmol/L Final  . CO2 05/23/2015 22  22 - 32 mmol/L Final  . Glucose, Bld 05/23/2015 122* 65 - 99 mg/dL Final  . BUN 05/23/2015 7  6 - 20 mg/dL Final  . Creatinine, Ser 05/23/2015 0.71  0.44 - 1.00 mg/dL Final  . Calcium 05/23/2015 8.3* 8.9 - 10.3 mg/dL Final  . Total Protein 05/23/2015 6.6  6.5 - 8.1 g/dL Final  . Albumin 05/23/2015 3.5  3.5 - 5.0 g/dL Final  . AST 05/23/2015 32  15 - 41 U/L Final  . ALT 05/23/2015 13* 14 - 54 U/L Final  . Alkaline Phosphatase 05/23/2015 70  38 - 126 U/L Final  . Total Bilirubin 05/23/2015 0.7  0.3 - 1.2 mg/dL Final  . GFR calc non Af Amer 05/23/2015 >60  >60 mL/min Final  . GFR calc Af Amer 05/23/2015 >60  >60 mL/min Final   Comment: (NOTE) The eGFR has been calculated using the CKD EPI equation. This calculation has not been validated in all clinical situations. eGFR's  persistently <60 mL/min signify possible Chronic Kidney Disease.   . Anion gap 05/23/2015 6  5 - 15 Final    No results found for: LABCA2 Lab Results  Component Value Date   CA199 <1 02/03/2014   Lab Results  Component Value Date   CEA 4.2 04/18/2015      ASSESSMENT: Stage IV carcinoma of colon Proceed with chemotherapy  MEDICAL DECISION MAKING:   Lab Results  Component Value Date   CEA 4.2 04/18/2015  Continue oxaliplatin-based chemotherapy with desensitizing patient protocol so far patient has not developed any significant side effect or hypersensitivity reaction I will be available throughout patient's chemotherapy tomorrow for 7 or 8 hours because of desensitizing protocol and expected hypersensitivity reaction. Total duration of visit was 2mnutes.  50% or more time was spent in counseling patient and family regarding prognosis and options of treatment and available resources  Patient expressed understanding and was in agreement with this plan. She also understands that She can call clinic at any time with any questions, concerns, or complaints.   CEA slowly declining Continue  chemotherapy Colon cancer   Staging form: Colon and Rectum, AJCC 7th Edition     Clinical: Stage IVB (yT3, N1, M1b) - Signed by JForest Gleason MD on 03/07/2015   JForest Gleason MD   05/23/2015 10:41 AM

## 2015-05-24 ENCOUNTER — Inpatient Hospital Stay: Payer: 59

## 2015-05-24 VITALS — BP 107/72 | HR 81 | Temp 98.3°F | Resp 20

## 2015-05-24 DIAGNOSIS — C189 Malignant neoplasm of colon, unspecified: Secondary | ICD-10-CM

## 2015-05-24 DIAGNOSIS — C185 Malignant neoplasm of splenic flexure: Secondary | ICD-10-CM | POA: Diagnosis not present

## 2015-05-24 LAB — CEA: CEA: 4.7 ng/mL (ref 0.0–4.7)

## 2015-05-24 MED ORDER — SODIUM CHLORIDE 0.9 % IV SOLN
Freq: Once | INTRAVENOUS | Status: AC
  Administered 2015-05-24: 09:00:00 via INTRAVENOUS
  Filled 2015-05-24: qty 4

## 2015-05-24 MED ORDER — DIPHENHYDRAMINE HCL 50 MG/ML IJ SOLN
50.0000 mg | Freq: Once | INTRAMUSCULAR | Status: AC
  Administered 2015-05-24: 50 mg via INTRAVENOUS
  Filled 2015-05-24: qty 1

## 2015-05-24 MED ORDER — METHYLPREDNISOLONE SODIUM SUCC 125 MG IJ SOLR
INTRAMUSCULAR | Status: AC
  Filled 2015-05-24: qty 2

## 2015-05-24 MED ORDER — OXALIPLATIN CHEMO INJECTION FOR DESENSITIZATION 100 MG/20ML
165.7500 mg | Freq: Once | INTRAVENOUS | Status: AC
  Administered 2015-05-24: 165.75 mg via INTRAVENOUS
  Filled 2015-05-24: qty 33.15

## 2015-05-24 MED ORDER — METHYLPREDNISOLONE SODIUM SUCC 125 MG IJ SOLR
40.0000 mg | Freq: Once | INTRAMUSCULAR | Status: AC
  Administered 2015-05-24: 40 mg via INTRAVENOUS

## 2015-05-24 MED ORDER — METHYLPREDNISOLONE SODIUM SUCC 125 MG IJ SOLR
40.0000 mg | Freq: Once | INTRAMUSCULAR | Status: AC
Start: 2015-05-25 — End: 2015-05-24
  Administered 2015-05-24: 40 mg via INTRAVENOUS

## 2015-05-24 MED ORDER — SODIUM CHLORIDE 0.9 % IV SOLN
3600.0000 mg | INTRAVENOUS | Status: DC
  Administered 2015-05-24: 3600 mg via INTRAVENOUS
  Filled 2015-05-24: qty 72

## 2015-05-24 MED ORDER — DEXTROSE 5 % IV SOLN
Freq: Once | INTRAVENOUS | Status: AC
  Administered 2015-05-24: 09:00:00 via INTRAVENOUS
  Filled 2015-05-24: qty 1000

## 2015-05-24 MED ORDER — LEUCOVORIN CALCIUM INJECTION 350 MG
750.0000 mg | Freq: Once | INTRAVENOUS | Status: AC
  Administered 2015-05-24: 750 mg via INTRAVENOUS
  Filled 2015-05-24: qty 25

## 2015-05-24 MED ORDER — METHYLPREDNISOLONE SODIUM SUCC 125 MG IJ SOLR
40.0000 mg | Freq: Once | INTRAMUSCULAR | Status: AC
  Administered 2015-05-24: 40 mg via INTRAVENOUS
  Filled 2015-05-24: qty 2

## 2015-05-24 MED ORDER — FLUOROURACIL CHEMO INJECTION 2.5 GM/50ML
750.0000 mg | Freq: Once | INTRAVENOUS | Status: AC
  Administered 2015-05-24: 750 mg via INTRAVENOUS
  Filled 2015-05-24: qty 15

## 2015-05-24 MED ORDER — FAMOTIDINE IN NACL 20-0.9 MG/50ML-% IV SOLN
20.0000 mg | Freq: Two times a day (BID) | INTRAVENOUS | Status: DC
  Administered 2015-05-24: 20 mg via INTRAVENOUS
  Filled 2015-05-24: qty 50

## 2015-05-24 MED ORDER — HEPARIN SOD (PORK) LOCK FLUSH 100 UNIT/ML IV SOLN: 500.0000 [IU] | Freq: Once | INTRAVENOUS | Status: DC | PRN

## 2015-05-26 ENCOUNTER — Inpatient Hospital Stay: Payer: 59

## 2015-05-26 DIAGNOSIS — C185 Malignant neoplasm of splenic flexure: Secondary | ICD-10-CM | POA: Diagnosis not present

## 2015-05-26 MED ORDER — HEPARIN SOD (PORK) LOCK FLUSH 100 UNIT/ML IV SOLN
INTRAVENOUS | Status: AC
  Filled 2015-05-26: qty 5

## 2015-05-26 MED ORDER — SODIUM CHLORIDE 0.9 % IJ SOLN
10.0000 mL | Freq: Once | INTRAMUSCULAR | Status: AC
  Administered 2015-05-26: 10 mL via INTRAVENOUS
  Filled 2015-05-26: qty 10

## 2015-05-26 MED ORDER — HEPARIN SOD (PORK) LOCK FLUSH 100 UNIT/ML IV SOLN
500.0000 [IU] | Freq: Once | INTRAVENOUS | Status: AC
  Administered 2015-05-26: 500 [IU] via INTRAVENOUS

## 2015-05-28 ENCOUNTER — Telehealth: Payer: Self-pay | Admitting: *Deleted

## 2015-05-28 MED ORDER — ONDANSETRON 4 MG PO TBDP: 4.0000 mg | ORAL_TABLET | Freq: Four times a day (QID) | ORAL | Status: DC | PRN

## 2015-05-28 NOTE — Telephone Encounter (Signed)
Ondansetron ordered and pt informed

## 2015-05-28 NOTE — Telephone Encounter (Signed)
Promethazine is not controlling nausea and is requesting something else be added

## 2015-06-04 ENCOUNTER — Inpatient Hospital Stay: Payer: 59

## 2015-06-04 ENCOUNTER — Inpatient Hospital Stay: Payer: 59 | Attending: Oncology

## 2015-06-04 DIAGNOSIS — T451X5S Adverse effect of antineoplastic and immunosuppressive drugs, sequela: Secondary | ICD-10-CM | POA: Insufficient documentation

## 2015-06-04 DIAGNOSIS — Z79899 Other long term (current) drug therapy: Secondary | ICD-10-CM | POA: Insufficient documentation

## 2015-06-04 DIAGNOSIS — C185 Malignant neoplasm of splenic flexure: Secondary | ICD-10-CM | POA: Insufficient documentation

## 2015-06-04 DIAGNOSIS — C787 Secondary malignant neoplasm of liver and intrahepatic bile duct: Secondary | ICD-10-CM | POA: Insufficient documentation

## 2015-06-04 DIAGNOSIS — Z888 Allergy status to other drugs, medicaments and biological substances status: Secondary | ICD-10-CM | POA: Insufficient documentation

## 2015-06-04 DIAGNOSIS — R110 Nausea: Secondary | ICD-10-CM | POA: Insufficient documentation

## 2015-06-04 DIAGNOSIS — G620 Drug-induced polyneuropathy: Secondary | ICD-10-CM | POA: Insufficient documentation

## 2015-06-04 DIAGNOSIS — Z5111 Encounter for antineoplastic chemotherapy: Secondary | ICD-10-CM | POA: Insufficient documentation

## 2015-06-13 ENCOUNTER — Inpatient Hospital Stay: Payer: 59

## 2015-06-13 ENCOUNTER — Inpatient Hospital Stay (HOSPITAL_BASED_OUTPATIENT_CLINIC_OR_DEPARTMENT_OTHER): Payer: 59 | Admitting: Oncology

## 2015-06-13 ENCOUNTER — Encounter: Payer: Self-pay | Admitting: Oncology

## 2015-06-13 VITALS — BP 139/79 | HR 81 | Temp 97.8°F | Wt 199.0 lb

## 2015-06-13 DIAGNOSIS — C189 Malignant neoplasm of colon, unspecified: Secondary | ICD-10-CM

## 2015-06-13 DIAGNOSIS — R110 Nausea: Secondary | ICD-10-CM

## 2015-06-13 DIAGNOSIS — Z79899 Other long term (current) drug therapy: Secondary | ICD-10-CM

## 2015-06-13 DIAGNOSIS — C787 Secondary malignant neoplasm of liver and intrahepatic bile duct: Secondary | ICD-10-CM | POA: Diagnosis not present

## 2015-06-13 DIAGNOSIS — T451X5S Adverse effect of antineoplastic and immunosuppressive drugs, sequela: Secondary | ICD-10-CM | POA: Diagnosis not present

## 2015-06-13 DIAGNOSIS — Z888 Allergy status to other drugs, medicaments and biological substances status: Secondary | ICD-10-CM | POA: Diagnosis not present

## 2015-06-13 DIAGNOSIS — G620 Drug-induced polyneuropathy: Secondary | ICD-10-CM

## 2015-06-13 DIAGNOSIS — Z5111 Encounter for antineoplastic chemotherapy: Secondary | ICD-10-CM | POA: Diagnosis not present

## 2015-06-13 DIAGNOSIS — C185 Malignant neoplasm of splenic flexure: Secondary | ICD-10-CM

## 2015-06-13 LAB — COMPREHENSIVE METABOLIC PANEL
ALT: 15 U/L (ref 14–54)
ANION GAP: 4 — AB (ref 5–15)
AST: 29 U/L (ref 15–41)
Albumin: 3.4 g/dL — ABNORMAL LOW (ref 3.5–5.0)
Alkaline Phosphatase: 55 U/L (ref 38–126)
BILIRUBIN TOTAL: 0.7 mg/dL (ref 0.3–1.2)
BUN: 5 mg/dL — ABNORMAL LOW (ref 6–20)
CHLORIDE: 109 mmol/L (ref 101–111)
CO2: 24 mmol/L (ref 22–32)
Calcium: 8.5 mg/dL — ABNORMAL LOW (ref 8.9–10.3)
Creatinine, Ser: 0.63 mg/dL (ref 0.44–1.00)
GFR calc Af Amer: >60 mL/min (ref >60)
Glucose, Bld: 108 mg/dL — ABNORMAL HIGH (ref 65–99)
POTASSIUM: 3.1 mmol/L — AB (ref 3.5–5.1)
Sodium: 137 mmol/L (ref 135–145)
TOTAL PROTEIN: 6.5 g/dL (ref 6.5–8.1)

## 2015-06-13 LAB — CBC WITH DIFFERENTIAL/PLATELET
BASOS ABS: 0 10*3/uL (ref 0–0.1)
Basophils Relative: 1 %
EOS ABS: 0.1 10*3/uL (ref 0–0.7)
Eosinophils Relative: 4 %
HEMATOCRIT: 36.3 % (ref 35.0–47.0)
Hemoglobin: 12.4 g/dL (ref 12.0–16.0)
Lymphocytes Relative: 38 %
Lymphs Abs: 1.3 10*3/uL (ref 1.0–3.6)
MCH: 31 pg (ref 26.0–34.0)
MCHC: 34.1 g/dL (ref 32.0–36.0)
MCV: 90.9 fL (ref 80.0–100.0)
MONO ABS: 0.6 10*3/uL (ref 0.2–0.9)
MONOS PCT: 18 %
NEUTROS ABS: 1.4 10*3/uL (ref 1.4–6.5)
Neutrophils Relative %: 39 %
Platelets: 201 10*3/uL (ref 150–440)
RBC: 3.99 MIL/uL (ref 3.80–5.20)
RDW: 14.7 % — AB (ref 11.5–14.5)
WBC: 3.4 10*3/uL — ABNORMAL LOW (ref 3.6–11.0)

## 2015-06-13 LAB — MAGNESIUM: MAGNESIUM: 1.7 mg/dL (ref 1.7–2.4)

## 2015-06-13 MED ORDER — POTASSIUM CHLORIDE CRYS ER 20 MEQ PO TBCR: 20.0000 meq | EXTENDED_RELEASE_TABLET | Freq: Every day | ORAL | Status: DC

## 2015-06-13 NOTE — Progress Notes (Signed)
Patient does not have living will.  Never smoked.  More nausea since last treatment.

## 2015-06-14 ENCOUNTER — Ambulatory Visit: Payer: 59

## 2015-06-14 ENCOUNTER — Inpatient Hospital Stay: Payer: 59

## 2015-06-14 VITALS — BP 113/79 | HR 84 | Temp 97.2°F | Resp 18

## 2015-06-14 DIAGNOSIS — C189 Malignant neoplasm of colon, unspecified: Secondary | ICD-10-CM

## 2015-06-14 DIAGNOSIS — C185 Malignant neoplasm of splenic flexure: Secondary | ICD-10-CM | POA: Diagnosis not present

## 2015-06-14 LAB — CEA: CEA: 4.4 ng/mL (ref 0.0–4.7)

## 2015-06-14 MED ORDER — OXALIPLATIN CHEMO INJECTION FOR DESENSITIZATION 100 MG/20ML
165.7500 mg | Freq: Once | INTRAVENOUS | Status: AC
  Administered 2015-06-14: 165.75 mg via INTRAVENOUS
  Filled 2015-06-14: qty 33.15

## 2015-06-14 MED ORDER — DIPHENHYDRAMINE HCL 50 MG/ML IJ SOLN
50.0000 mg | Freq: Once | INTRAMUSCULAR | Status: AC
Start: 2015-06-14 — End: 2015-06-14
  Administered 2015-06-14: 50 mg via INTRAVENOUS
  Filled 2015-06-14: qty 1

## 2015-06-14 MED ORDER — LEUCOVORIN CALCIUM INJECTION 350 MG
750.0000 mg | Freq: Once | INTRAVENOUS | Status: AC
  Administered 2015-06-14: 750 mg via INTRAVENOUS
  Filled 2015-06-14: qty 32.5

## 2015-06-14 MED ORDER — SODIUM CHLORIDE 0.9 % IV SOLN
Freq: Once | INTRAVENOUS | Status: AC
  Administered 2015-06-14: 09:00:00 via INTRAVENOUS
  Filled 2015-06-14: qty 4

## 2015-06-14 MED ORDER — METHYLPREDNISOLONE SODIUM SUCC 125 MG IJ SOLR
40.0000 mg | Freq: Once | INTRAMUSCULAR | Status: AC
  Administered 2015-06-14: 40 mg via INTRAVENOUS
  Filled 2015-06-14: qty 2

## 2015-06-14 MED ORDER — FLUOROURACIL CHEMO INJECTION 2.5 GM/50ML
750.0000 mg | Freq: Once | INTRAVENOUS | Status: AC
  Administered 2015-06-14: 750 mg via INTRAVENOUS
  Filled 2015-06-14: qty 15

## 2015-06-14 MED ORDER — METHYLPREDNISOLONE SODIUM SUCC 125 MG IJ SOLR
40.0000 mg | Freq: Once | INTRAMUSCULAR | Status: AC
  Administered 2015-06-14: 40 mg via INTRAVENOUS

## 2015-06-14 MED ORDER — METHYLPREDNISOLONE SODIUM SUCC 125 MG IJ SOLR
INTRAMUSCULAR | Status: AC
  Filled 2015-06-14: qty 2

## 2015-06-14 MED ORDER — SODIUM CHLORIDE 0.9 % IV SOLN
Freq: Once | INTRAVENOUS | Status: AC
  Administered 2015-06-14: 10:00:00 via INTRAVENOUS
  Filled 2015-06-14: qty 250

## 2015-06-14 MED ORDER — SODIUM CHLORIDE 0.9 % IJ SOLN
10.0000 mL | INTRAMUSCULAR | Status: DC | PRN
  Administered 2015-06-14: 10 mL
  Filled 2015-06-14: qty 10

## 2015-06-14 MED ORDER — SODIUM CHLORIDE 0.9 % IV SOLN
3600.0000 mg | INTRAVENOUS | Status: DC
  Filled 2015-06-14: qty 72

## 2015-06-14 MED ORDER — DEXTROSE 5 % IV SOLN
Freq: Once | INTRAVENOUS | Status: AC
Start: 2015-06-14 — End: 2015-06-14
  Administered 2015-06-14: 09:00:00 via INTRAVENOUS
  Filled 2015-06-14: qty 1000

## 2015-06-14 MED ORDER — FAMOTIDINE IN NACL 20-0.9 MG/50ML-% IV SOLN
20.0000 mg | Freq: Two times a day (BID) | INTRAVENOUS | Status: DC
  Administered 2015-06-14: 20 mg via INTRAVENOUS
  Filled 2015-06-14: qty 50

## 2015-06-14 MED ORDER — FLUOROURACIL CHEMO INJECTION 5 GM/100ML
3600.0000 mg | INTRAVENOUS | Status: DC
  Administered 2015-06-14: 3600 mg via INTRAVENOUS
  Filled 2015-06-14: qty 72

## 2015-06-16 ENCOUNTER — Inpatient Hospital Stay: Payer: 59

## 2015-06-16 DIAGNOSIS — C189 Malignant neoplasm of colon, unspecified: Secondary | ICD-10-CM

## 2015-06-16 DIAGNOSIS — C185 Malignant neoplasm of splenic flexure: Secondary | ICD-10-CM | POA: Diagnosis not present

## 2015-06-16 MED ORDER — SODIUM CHLORIDE 0.9 % IJ SOLN
10.0000 mL | Freq: Once | INTRAMUSCULAR | Status: AC
  Administered 2015-06-16: 10 mL
  Filled 2015-06-16: qty 10

## 2015-06-16 MED ORDER — HEPARIN SOD (PORK) LOCK FLUSH 100 UNIT/ML IV SOLN
500.0000 [IU] | Freq: Once | INTRAVENOUS | Status: AC
  Administered 2015-06-16: 500 [IU]

## 2015-06-16 MED ORDER — PROMETHAZINE HCL 25 MG RE SUPP: 25.0000 mg | Freq: Four times a day (QID) | RECTAL | Status: AC | PRN

## 2015-06-16 NOTE — Progress Notes (Signed)
Pt came into clinic for pump removal and stated has had severe nausea that started yesterday. Has been taking ondansetron every 4 hours as need but symptoms have not resolved. Per MD instructions, will call in new prescription for promethazine suppositories to help control nausea to prevent vomiting. Pt instructed that med will be called into pharmacy and can pick it up today. Instructed pt to callback if develops vomiting and cannot keep liquids down. Pt verbalized understanding.

## 2015-06-24 ENCOUNTER — Encounter: Payer: Self-pay | Admitting: Oncology

## 2015-06-24 NOTE — Progress Notes (Signed)
[place] @ [facility] Telephone:([phone]  Fax:([phone]) [place]: Feb 18, 1971  MR#: [id number]  WNU#:[id number]  Patient Care Team: Lynnell Jude, MD as PCP - General (Family Medicine)  CHIEF COMPLAINT:  Chief Complaint  Patient presents with  . Follow-up    Oncology History   1. Carcinoma of the colon, splenic  flexure.   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 2. K-RAS mutated. 3. Patient started on FOLFOX and Avastin 4 th June, 2015 4. Allergic reaction  characterized by generalized itching and redness  of face and palm on June 15, 2014 5. Started on FOLFIRI  and avastin   July 13, 2014 6. Finished 12 cycles of chemotherapy, 5-FU based FOLFOX was changed to FOLFIRI BECAUSE OF SENSITIVITY TO OXALIPLATIN(September 07, 2014) 7. Started on maintenance chemotherapy with 5-FU leucovorin and Avastin from September 28, 2014 8.  Progressive disease based on tumor markers as well as PET scan's so patient was started on FOLFOX chemotherapy with desensitizing  protocol.     Colon cancer   02/10/2014 Initial Diagnosis Colon cancer    Oncology Flowsheet 05/24/2015 05/24/2015 06/14/2015 06/14/2015 06/14/2015 06/14/2015 06/14/2015  Day, Cycle     Day 1, Cycle 3          dexamethasone (DECADRON) IV     [ 10 mg ]          fluorouracil (ADRUCIL) IV     750 mg 3,600 mg        fosaprepitant (EMEND) IV - - - - - - -  leucovorin IV     750 mg          methylPREDNISolone sodium succinate 125 mg/2 mL (SOLU-MEDROL) IV 40 _0  mg 40 mg  ondansetron (ZOFRAN) IV     [ 8 mg ]          oxaliplatin (ELOXATIN) IV 165.75 mg 165.75 mg 165.75 mg 165.75 mg 165.75 mg 165.75 mg 165.75 mg  palonosetron (ALOXI) IV - - - - - - -    INTERVAL HISTORY: 44 year old lady with stage IV carcinoma of colon came today further follow-up.   Patient is tolerating oxaliplatin with  desensitizing protocol.  No chills.  No fever.  Tingling numbness in upper and lower extremity is intermittent.  Does not impair with motor function No nausea no vomiting Here for further follow-up and treatment consideration. May 23, 2015 Patient is here for ongoing evaluation and consideration of chemotherapy.  Tolerating treatment very well.  No tingling.  No numbness.  No abdominal pain. CEA remains stable. PET scan was done in June was showing no evidence of progressive disease August, 2016 Patient is here for her next course of chemotherapy with FOLFOX.  Patient had mild nausea responded well to Zofran.  Patient is working full-time.  No tingling or numbness.  Oxaliplatin desensitization protocol being used So far he has not developed any significant side effects from oxaliplatin CEA remains stable PET scan remains stable  REVIEW OF SYSTEMS:   GENERAL:  Feels good.  Active.  No fevers, sweats or weight loss. PERFORMANCE STATUS (ECOG): 0 HEENT:  No visual changes, runny nose, sore throat, mouth sores or tenderness. Lungs: No shortness of breath or cough.  No hemoptysis. Cardiac:  No chest pain, palpitations, orthopnea, or PND. GI:  No nausea, vomiting, diarrhea,  constipation, melena or hematochezia. GU:  No urgency, frequency, dysuria, or hematuria. Musculoskeletal:  No back pain.  No joint pain.  No muscle tenderness. Extremities:  No pain or swelling. Skin:  No rashes or skin changes. Neuro:  No headache, numbness or weakness, balance or coordination issues. Endocrine:  No diabetes, thyroid issues, hot flashes or night sweats. Psych:  No mood changes, depression or anxiety. Pain:  No focal pain. Review of systems:  All other systems reviewed and found to be negative.  As per HPI. Otherwise, a complete review of systems is negatve.  PAST MEDICAL HISTORY: Past Medical History  Diagnosis Date  . Ulcer   . Anemia   . Cancer 2015    colon  . Colon cancer metastasized to liver  April 2015    T3, N2, M1, stage IV with biopsy-proven liver metastases. On adjuvant chemotherapy  . Obesity   . Constipation   . Migraines     PAST SURGICAL HISTORY: Past Surgical History  Procedure Laterality Date  . Cholecystectomy  2014  . Tubal ligation  2005  . Colonoscopy  2015  . Upper gi endoscopy  2015    FAMILY HISTORY No significant family history of malignancy       ADVANCED DIRECTIVES:   Patient does have advanced healthcare directive HEALTH MAINTENANCE: Social History  Substance Use Topics  . Smoking status: Never Smoker   . Smokeless tobacco: None  . Alcohol Use: No      Allergies  Allergen Reactions  . Oxaliplatin Itching    Current Outpatient Prescriptions  Medication Sig Dispense Refill  . acetaminophen (TYLENOL) 500 MG tablet Take 500 mg by mouth every 6 (six) hours as needed.    . diphenoxylate-atropine (LOMOTIL) 2.5-0.025 MG per tablet Take 1 tablet by mouth 4 (four) times daily as needed for diarrhea or loose stools (2 tabs to start, then one by mouth every 6 hours if needed for diarrhea.). 30 tablet 0  . ferrous fumarate-iron polysaccharide complex (TANDEM) 162-115.2 MG CAPS Take 1 capsule by mouth 2 (two) times daily.    Marland Kitchen gabapentin (NEURONTIN) 300 MG capsule Take 300 mg by mouth 2 (two) times daily.    Marland Kitchen ibuprofen (ADVIL,MOTRIN) 800 MG tablet Take 800 mg by mouth 3 (three) times daily.    Marland Kitchen lidocaine (LIDODERM) 5 % Place 1 patch onto the skin daily. Remove & Discard patch within 12 hours or as directed by MD    . methocarbamol (ROBAXIN) 750 MG tablet Take 750 mg by mouth 2 (two) times daily. 2 tabs    . ondansetron (ZOFRAN-ODT) 4 MG disintegrating tablet Take 1 tablet (4 mg total) by mouth 4 (four) times daily as needed for nausea or vomiting. 30 tablet 0  . pantoprazole (PROTONIX) 40 MG tablet Take 40 mg by mouth 2 (two) times daily.    . polyethylene glycol (MIRALAX / GLYCOLAX) packet Take 17 g by mouth daily.    Marland Kitchen  sulfamethoxazole-trimethoprim (BACTRIM DS,SEPTRA DS) 800-160 MG per tablet Take 1 tablet by mouth 2 (two) times daily.    . traMADol (ULTRAM) 50 MG tablet Take 50 mg by mouth every 4 (four) hours.    . valACYclovir (VALTREX) 1000 MG tablet Take 1,000 mg by mouth 2 (two) times daily.    . potassium chloride SA (K-DUR,KLOR-CON) 20 MEQ tablet Take 1 tablet (20 mEq total) by mouth daily. 30 tablet 3  . predniSONE (DELTASONE) 50 MG tablet     . promethazine (PHENERGAN) 25 MG suppository Place 1  suppository (25 mg total) rectally every 6 (six) hours as needed for nausea or vomiting. 6 each 0   No current facility-administered medications for this visit.   Facility-Administered Medications Ordered in Other Visits  Medication Dose Route Frequency Provider Last Rate Last Dose  . sodium chloride 0.9 % injection 10 mL  10 mL Intracatheter PRN [name], MD   10 mL at 03/21/15 1025  . sodium chloride 0.9 % injection 10 mL  10 mL Intravenous PRN [name], MD   10 mL at 04/21/15 1025    OBJECTIVE:  Filed Vitals:   06/13/15 1158  BP: 139/79  Pulse: 81  Temp: 97.8 F (36.6 C)     Body mass index is 35.26 kg/(m^2).    ECOG FS:0 - Asymptomatic  PHYSICAL EXAM: GENERAL:  Well developed, well nourished, sitting comfortably in the exam room in no acute distress. MENTAL STATUS:  Alert and oriented to person, place and time. HEAD:  .  Normocephalic, atraumatic, face symmetric, no Cushingoid features. EYES:   Pupils equal round and reactive to light and accomodation.  No conjunctivitis or scleral icterus. ENT:  Oropharynx clear without lesion.  Tongue normal. Mucous membranes moist.  RESPIRATORY:  Clear to auscultation without rales, wheezes or rhonchi. CARDIOVASCULAR:  Regular rate and rhythm without murmur, rub or gallop. BREAST:  Right breast without masses, skin changes or nipple discharge.  Left breast without masses, skin changes or nipple discharge. ABDOMEN:  Soft, non-tender, with active  bowel sounds, and no hepatosplenomegaly.  No masses. BACK:  No CVA tenderness.  No tenderness on percussion of the back or rib cage. SKIN:  No rashes, ulcers or lesions. EXTREMITIES: No edema, no skin discoloration or tenderness.  No palpable cords. LYMPH NODES: No palpable cervical, supraclavicular, axillary or inguinal adenopathy  NEUROLOGICAL: Unremarkable. PSYCH:  Appropriate.   LAB RESULTS:  Appointment on 06/13/2015  Component Date Value Ref Range Status  . WBC 06/13/2015 3.4* 3.6 - 11.0 K/uL Final  . RBC 06/13/2015 3.99  3.80 - 5.20 MIL/uL Final  . Hemoglobin 06/13/2015 12.4  12.0 - 16.0 g/dL Final  . HCT 06/13/2015 36.3  35.0 - 47.0 % Final  . MCV 06/13/2015 90.9  80.0 - 100.0 fL Final  . MCH 06/13/2015 31.0  26.0 - 34.0 pg Final  . MCHC 06/13/2015 34.1  32.0 - 36.0 g/dL Final  . RDW 06/13/2015 14.7* 11.5 - 14.5 % Final  . Platelets 06/13/2015 201  150 - 440 K/uL Final  . Neutrophils Relative % 06/13/2015 39   Final  . Neutro Abs 06/13/2015 1.4  1.4 - 6.5 K/uL Final  . Lymphocytes Relative 06/13/2015 38   Final  . Lymphs Abs 06/13/2015 1.3  1.0 - 3.6 K/uL Final  . Monocytes Relative 06/13/2015 18   Final  . Monocytes Absolute 06/13/2015 0.6  0.2 - 0.9 K/uL Final  . Eosinophils Relative 06/13/2015 4   Final  . Eosinophils Absolute 06/13/2015 0.1  0 - 0.7 K/uL Final  . Basophils Relative 06/13/2015 1   Final  . Basophils Absolute 06/13/2015 0.0  0 - 0.1 K/uL Final  . Sodium 06/13/2015 137  135 - 145 mmol/L Final  . Potassium 06/13/2015 3.1* 3.5 - 5.1 mmol/L Final  . Chloride 06/13/2015 109  101 - 111 mmol/L Final  . CO2 06/13/2015 24  22 - 32 mmol/L Final  . Glucose, Bld 06/13/2015 108* 65 - 99 mg/dL Final  . BUN 06/13/2015 5* 6 - 20 mg/dL Final  . Creatinine, Ser 06/13/2015  0.63  0.44 - 1.00 mg/dL Final  . Calcium 06/13/2015 8.5* 8.9 - 10.3 mg/dL Final  . Total Protein 06/13/2015 6.5  6.5 - 8.1 g/dL Final  . Albumin 06/13/2015 3.4* 3.5 - 5.0 g/dL Final  . AST  06/13/2015 29  15 - 41 U/L Final  . ALT 06/13/2015 15  14 - 54 U/L Final  . Alkaline Phosphatase 06/13/2015 55  38 - 126 U/L Final  . Total Bilirubin 06/13/2015 0.7  0.3 - 1.2 mg/dL Final  . GFR calc non Af Amer 06/13/2015 >60  >60 mL/min Final  . GFR calc Af Amer 06/13/2015 >60  >60 mL/min Final   Comment: (NOTE) The eGFR has been calculated using the CKD EPI equation. This calculation has not been validated in all clinical situations. eGFR's persistently <60 mL/min signify possible Chronic Kidney Disease.   . Anion gap 06/13/2015 4* 5 - 15 Final  . Magnesium 06/13/2015 1.7  1.7 - 2.4 mg/dL Final  . CEA 06/13/2015 4.4  0.0 - 4.7 ng/mL Final   Comment: (NOTE)       Roche ECLIA methodology       Nonsmokers  <3.9                                     Smokers     <5.6 Performed At: [facility] [place], [place] [id number] Lindon Romp MD QL:[id number]     No results found for: LABCA2 Lab Results  Component Value Date   CA199 <1 02/03/2014   Lab Results  Component Value Date   CEA 4.4 06/13/2015   Component     Latest Ref Rng 01/25/2015 03/08/2015 03/21/2015 04/18/2015 05/23/2015  CEA     0.0 - 4.7 ng/mL 4.8 (H) 6.0 (H) 5.0 (H) 4.2 4.7   Component     Latest Ref Rng 06/13/2015  CEA     0.0 - 4.7 ng/mL 4.4     ASSESSMENT: Stage IV carcinoma of colon Proceed with chemotherapy  MEDICAL DECISION MAKING:   Lab Results  Component Value Date   CEA 4.4 06/13/2015  Continue oxaliplatin-based chemotherapy with desensitizing patient protocol so far patient has not developed any significant side effect or hypersensitivity reaction I will be available throughout patient's chemotherapy tomorrow for 7 or 8 hours because of desensitizing protocol and expected hypersensitivity reaction. Total duration of visit was 83mnutes.  50% or more time was spent in counseling patient and family regarding prognosis and options of treatment and available resources  Patient  expressed understanding and was in agreement with this plan. She also understands that She can call clinic at any time with any questions, concerns, or complaints.   CEA slowly declining Continue  chemotherapy Colon cancer   Staging form: Colon and Rectum, AJCC 7th Edition     Clinical: Stage IVB (yT3, N1, M1b) - Signed by JForest Gleason MD on 03/07/2015   JForest Gleason MD   06/24/2015 7:52 PM

## 2015-07-04 ENCOUNTER — Inpatient Hospital Stay: Payer: 59 | Attending: Oncology

## 2015-07-04 ENCOUNTER — Inpatient Hospital Stay (HOSPITAL_BASED_OUTPATIENT_CLINIC_OR_DEPARTMENT_OTHER): Payer: 59 | Admitting: Oncology

## 2015-07-04 ENCOUNTER — Encounter: Payer: Self-pay | Admitting: Oncology

## 2015-07-04 VITALS — BP 109/77 | HR 76 | Temp 97.5°F | Wt 197.0 lb

## 2015-07-04 DIAGNOSIS — R110 Nausea: Secondary | ICD-10-CM | POA: Diagnosis not present

## 2015-07-04 DIAGNOSIS — C189 Malignant neoplasm of colon, unspecified: Secondary | ICD-10-CM

## 2015-07-04 DIAGNOSIS — Z5111 Encounter for antineoplastic chemotherapy: Secondary | ICD-10-CM | POA: Diagnosis not present

## 2015-07-04 DIAGNOSIS — Z888 Allergy status to other drugs, medicaments and biological substances status: Secondary | ICD-10-CM | POA: Insufficient documentation

## 2015-07-04 DIAGNOSIS — Z7952 Long term (current) use of systemic steroids: Secondary | ICD-10-CM | POA: Insufficient documentation

## 2015-07-04 DIAGNOSIS — M549 Dorsalgia, unspecified: Secondary | ICD-10-CM | POA: Diagnosis not present

## 2015-07-04 DIAGNOSIS — Z79899 Other long term (current) drug therapy: Secondary | ICD-10-CM | POA: Diagnosis not present

## 2015-07-04 DIAGNOSIS — C787 Secondary malignant neoplasm of liver and intrahepatic bile duct: Secondary | ICD-10-CM | POA: Diagnosis not present

## 2015-07-04 DIAGNOSIS — C185 Malignant neoplasm of splenic flexure: Secondary | ICD-10-CM | POA: Diagnosis not present

## 2015-07-04 LAB — COMPREHENSIVE METABOLIC PANEL
ALK PHOS: 60 U/L (ref 38–126)
ALT: 14 U/L (ref 14–54)
AST: 26 U/L (ref 15–41)
Albumin: 3.6 g/dL (ref 3.5–5.0)
Anion gap: 5 (ref 5–15)
BILIRUBIN TOTAL: 0.8 mg/dL (ref 0.3–1.2)
CALCIUM: 8.9 mg/dL (ref 8.9–10.3)
CHLORIDE: 108 mmol/L (ref 101–111)
CO2: 27 mmol/L (ref 22–32)
CREATININE: 0.56 mg/dL (ref 0.44–1.00)
Glucose, Bld: 107 mg/dL — ABNORMAL HIGH (ref 65–99)
Potassium: 3.3 mmol/L — ABNORMAL LOW (ref 3.5–5.1)
Sodium: 140 mmol/L (ref 135–145)
TOTAL PROTEIN: 7.2 g/dL (ref 6.5–8.1)

## 2015-07-04 LAB — CBC WITH DIFFERENTIAL/PLATELET
BASOS ABS: 0 10*3/uL (ref 0–0.1)
Basophils Relative: 1 %
EOS PCT: 5 %
Eosinophils Absolute: 0.2 10*3/uL (ref 0–0.7)
HEMATOCRIT: 37 % (ref 35.0–47.0)
HEMOGLOBIN: 12.6 g/dL (ref 12.0–16.0)
LYMPHS ABS: 1.5 10*3/uL (ref 1.0–3.6)
LYMPHS PCT: 41 %
MCH: 30.3 pg (ref 26.0–34.0)
MCHC: 34.2 g/dL (ref 32.0–36.0)
MCV: 88.6 fL (ref 80.0–100.0)
Monocytes Absolute: 0.6 10*3/uL (ref 0.2–0.9)
Monocytes Relative: 18 %
NEUTROS ABS: 1.2 10*3/uL — AB (ref 1.4–6.5)
Neutrophils Relative %: 35 %
PLATELETS: 242 10*3/uL (ref 150–440)
RBC: 4.17 MIL/uL (ref 3.80–5.20)
RDW: 14.4 % (ref 11.5–14.5)
WBC: 3.6 10*3/uL (ref 3.6–11.0)

## 2015-07-04 LAB — MAGNESIUM: MAGNESIUM: 1.9 mg/dL (ref 1.7–2.4)

## 2015-07-04 NOTE — Progress Notes (Signed)
[place] @ [facility] [phone]  Fax:(336) [place]: 1971-06-30  MR#: [id number]  SVX#:[id number]  Patient Care Team: Lynnell Jude, MD as PCP - General (Family Medicine)  CHIEF COMPLAINT:  Chief Complaint  Patient presents with  . Follow-up   Oncology History   1. Carcinoma of the colon, splenic  flexure.   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 2. K-RAS mutated. 3. Patient started on FOLFOX and Avastin 4 th June, 2015 4. Allergic reaction  characterized by generalized itching and redness  of face and palm on June 15, 2014 5. Started on FOLFIRI  and avastin   July 13, 2014 6. Finished 12 cycles of chemotherapy, 5-FU based FOLFOX was changed to FOLFIRI BECAUSE OF SENSITIVITY TO OXALIPLATIN(September 07, 2014) 7. Started on maintenance chemotherapy with 5-FU leucovorin and Avastin from September 28, 2014 8.  Progressive disease based on tumor markers as well as PET scan's so patient was started on FOLFOX chemotherapy with desensitizing  protocol.      Oncology Flowsheet 05/24/2015 05/24/2015 06/14/2015 06/14/2015 06/14/2015 06/14/2015 06/14/2015  Day, Cycle     Day 1, Cycle 3          dexamethasone (DECADRON) IV     [ 10 mg ]          fluorouracil (ADRUCIL) IV     750 mg 3,600 mg        fosaprepitant (EMEND) IV - - - - - - -  leucovorin IV     750 mg          methylPREDNISolone sodium succinate 125 mg/2 mL (SOLU-MEDROL) IV 40 mg 40 mg 40 mg 40 mg 40 mg 40 mg 40 mg  ondansetron (ZOFRAN) IV     [ 8 mg ]          oxaliplatin (ELOXATIN) IV 165.75 mg 165.75 mg 165.75 mg 165.75 mg 165.75 mg 165.75 mg 165.75 mg  palonosetron (ALOXI) IV - - - - - - -    INTERVAL HISTORY: 44 year old lady with stage IV carcinoma of colon came today further follow-up.   Patient is tolerating oxaliplatin with desensitizing protocol.  No chills.  No fever.  Tingling  numbness in upper and lower extremity is intermittent.  Does not impair with motor function No nausea no vomiting Here for further follow-up and treatment consideration. May 23, 2015 Patient is here for ongoing evaluation and consideration of chemotherapy.  Tolerating treatment very well.  No tingling.  No numbness.  No abdominal pain. CEA remains stable. PET scan was done in June was showing no evidence of progressive disease August, 2016 Patient is here for her next course of chemotherapy with FOLFOX.  Patient had mild nausea responded well to Zofran.  Patient is working full-time.  No tingling or numbness.  Oxaliplatin desensitization protocol being used So far he has not developed any significant side effects from oxaliplatin CEA remains stable PET scan remains stable  In September, 2016 Patient has back pain started a few days ago.  This time with the chemotherapy patient had prolonged nausea.  Appetite has been stable.  No tingling.  No numbness.  Feeling somewhat depressed because of persistent nausea. REVIEW OF SYSTEMS:   GENERAL:  Feels good.  Active.  No fevers, sweats or weight loss. PERFORMANCE STATUS (ECOG): 0 HEENT:  No visual changes, runny nose, sore throat,  mouth sores or tenderness. Lungs: No shortness of breath or cough.  No hemoptysis. Cardiac:  No chest pain, palpitations, orthopnea, or PND. GI:  No nausea, vomiting, diarrhea, constipation, melena or hematochezia. GU:  No urgency, frequency, dysuria, or hematuria. Musculoskeletal:  No back pain.  No joint pain.  No muscle tenderness. Extremities:  No pain or swelling. Skin:  No rashes or skin changes. Neuro:  No headache, numbness or weakness, balance or coordination issues. Endocrine:  No diabetes, thyroid issues, hot flashes or night sweats. Psych:  No mood changes, depression or anxiety. Pain:  No focal pain. Review of systems:  All other systems reviewed and found to be negative.  As per HPI. Otherwise, a  complete review of systems is negatve.  PAST MEDICAL HISTORY: Past Medical History  Diagnosis Date  . Ulcer   . Anemia   . Cancer 2015    colon  . 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 On adjuvant chemotherapy  . Obesity   . Constipation   . Migraines     PAST SURGICAL HISTORY: Past Surgical History  Procedure Laterality Date  . Cholecystectomy  2014  . Tubal ligation  2005  . Colonoscopy  2015  . Upper gi endoscopy  2015    FAMILY HISTORY No significant family history of malignancy       ADVANCED DIRECTIVES:   Patient does have advanced healthcare directive HEALTH MAINTENANCE: Social History  Substance Use Topics  . Smoking status: Never Smoker   . Smokeless tobacco: None  . Alcohol Use: No      Allergies  Allergen Reactions  . Oxaliplatin Itching    Current Outpatient Prescriptions  Medication Sig Dispense Refill  . acetaminophen (TYLENOL) 500 MG tablet Take 500 mg by mouth every 6 (six) hours as needed.    . diphenoxylate-atropine (LOMOTIL) 2.5-0.025 MG per tablet Take 1 tablet by mouth 4 (four) times daily as needed for diarrhea or loose stools (2 tabs to start, then one by mouth every 6 hours if needed for diarrhea.). 30 tablet 0  . ferrous fumarate-iron polysaccharide complex (TANDEM) 162-115.2 MG CAPS Take 1 capsule by mouth 2 (two) times daily.    Marland Kitchen gabapentin (NEURONTIN) 300 MG capsule Take 300 mg by mouth 2 (two) times daily.    Marland Kitchen ibuprofen (ADVIL,MOTRIN) 800 MG tablet Take 800 mg by mouth 3 (three) times daily.    Marland Kitchen lidocaine (LIDODERM) 5 % Place 1 patch onto the skin daily. Remove & Discard patch within 12 hours or as directed by MD    . methocarbamol (ROBAXIN) 750 MG tablet Take 750 mg by mouth 2 (two) times daily. 2 tabs    . ondansetron (ZOFRAN-ODT) 4 MG disintegrating tablet Take 1 tablet (4 mg total) by mouth 4 (four) times daily as needed for nausea or vomiting. 30 tablet 0   . pantoprazole (PROTONIX) 40 MG tablet Take 40 mg by mouth 2 (two) times daily.    . polyethylene glycol (MIRALAX / GLYCOLAX) packet Take 17 g by mouth daily.    . potassium chloride SA (K-DUR,KLOR-CON) 20 MEQ tablet Take 1 tablet (20 mEq total) by mouth daily. 30 tablet 3  . predniSONE (DELTASONE) 50 MG tablet     . promethazine (PHENERGAN) 25 MG suppository Place 1 suppository (25 mg total) rectally every 6 (six) hours as needed for nausea or vomiting. 6 each 0  . sulfamethoxazole-trimethoprim (BACTRIM DS,SEPTRA DS) 800-160 MG per tablet  Take 1 tablet by mouth 2 (two) times daily.    . traMADol (ULTRAM) 50 MG tablet Take 50 mg by mouth every 4 (four) hours.    . valACYclovir (VALTREX) 1000 MG tablet Take 1,000 mg by mouth 2 (two) times daily.     No current facility-administered medications for this visit.   Facility-Administered Medications Ordered in Other Visits  Medication Dose Route Frequency Provider Last Rate Last Dose  . sodium chloride 0.9 % injection 10 mL  10 mL Intracatheter PRN [name], MD   10 mL at 03/21/15 1025  . sodium chloride 0.9 % injection 10 mL  10 mL Intravenous PRN [name], MD   10 mL at 04/21/15 1025    OBJECTIVE:  Filed Vitals:   07/04/15 0852  BP: 109/77  Pulse: 76  Temp: 97.5 F (36.4 C)     Body mass index is 34.91 kg/(m^2).    ECOG FS:0 - Asymptomatic  PHYSICAL EXAM: GENERAL:  Well developed, well nourished, sitting comfortably in the exam room in no acute distress. MENTAL STATUS:  Alert and oriented to person, place and time. HEAD:  .  Normocephalic, atraumatic, face symmetric, no Cushingoid features. EYES:   Pupils equal round and reactive to light and accomodation.  No conjunctivitis or scleral icterus. ENT:  Oropharynx clear without lesion.  Tongue normal. Mucous membranes moist.  RESPIRATORY:  Clear to auscultation without rales, wheezes or rhonchi. CARDIOVASCULAR:  Regular rate and rhythm without murmur, rub or gallop. BREAST:   Right breast without masses, skin changes or nipple discharge.  Left breast without masses, skin changes or nipple discharge. ABDOMEN:  Soft, non-tender, with active bowel sounds, and no hepatosplenomegaly.  No masses. BACK:  No CVA tenderness.  No tenderness on percussion of the back or rib cage. SKIN:  No rashes, ulcers or lesions. EXTREMITIES: No edema, no skin discoloration or tenderness.  No palpable cords. LYMPH NODES: No palpable cervical, supraclavicular, axillary or inguinal adenopathy  NEUROLOGICAL: Unremarkable. PSYCH:  Appropriate.   LAB RESULTS:  Appointment on 07/04/2015  Component Date Value Ref Range Status  . WBC 07/04/2015 3.6  3.6 - 11.0 K/uL Final  . RBC 07/04/2015 4.17  3.80 - 5.20 MIL/uL Final  . Hemoglobin 07/04/2015 12.6  12.0 - 16.0 g/dL Final  . HCT 07/04/2015 37.0  35.0 - 47.0 % Final  . MCV 07/04/2015 88.6  80.0 - 100.0 fL Final  . MCH 07/04/2015 30.3  26.0 - 34.0 pg Final  . MCHC 07/04/2015 34.2  32.0 - 36.0 g/dL Final  . RDW 07/04/2015 14.4  11.5 - 14.5 % Final  . Platelets 07/04/2015 242  150 - 440 K/uL Final  . Neutrophils Relative % 07/04/2015 35   Final  . Neutro Abs 07/04/2015 1.2* 1.4 - 6.5 K/uL Final  . Lymphocytes Relative 07/04/2015 41   Final  . Lymphs Abs 07/04/2015 1.5  1.0 - 3.6 K/uL Final  . Monocytes Relative 07/04/2015 18   Final  . Monocytes Absolute 07/04/2015 0.6  0.2 - 0.9 K/uL Final  . Eosinophils Relative 07/04/2015 5   Final  . Eosinophils Absolute 07/04/2015 0.2  0 - 0.7 K/uL Final  . Basophils Relative 07/04/2015 1   Final  . Basophils Absolute 07/04/2015 0.0  0 - 0.1 K/uL Final  . Sodium 07/04/2015 140  135 - 145 mmol/L Final  . Potassium 07/04/2015 3.3* 3.5 - 5.1 mmol/L Final  . Chloride 07/04/2015 108  101 - 111 mmol/L Final  . CO2 07/04/2015 27  22 -  32 mmol/L Final  . Glucose, Bld 07/04/2015 107* 65 - 99 mg/dL Final  . BUN 07/04/2015 <5* 6 - 20 mg/dL Final  . Creatinine, Ser 07/04/2015 0.56  0.44 - 1.00 mg/dL Final  .  Calcium 07/04/2015 8.9  8.9 - 10.3 mg/dL Final  . Total Protein 07/04/2015 7.2  6.5 - 8.1 g/dL Final  . Albumin 07/04/2015 3.6  3.5 - 5.0 g/dL Final  . AST 07/04/2015 26  15 - 41 U/L Final  . ALT 07/04/2015 14  14 - 54 U/L Final  . Alkaline Phosphatase 07/04/2015 60  38 - 126 U/L Final  . Total Bilirubin 07/04/2015 0.8  0.3 - 1.2 mg/dL Final  . GFR calc non Af Amer 07/04/2015 >60  >60 mL/min Final  . GFR calc Af Amer 07/04/2015 >60  >60 mL/min Final   Comment: (NOTE) The eGFR has been calculated using the CKD EPI equation. This calculation has not been validated in all clinical situations. eGFR's persistently <60 mL/min signify possible Chronic Kidney Disease.   . Anion gap 07/04/2015 5  5 - 15 Final  . Magnesium 07/04/2015 1.9  1.7 - 2.4 mg/dL Final    No results found for: LABCA2 Lab Results  Component Value Date   CA199 <1 02/03/2014   Lab Results  Component Value Date   CEA 4.4 06/13/2015   Component     Latest Ref Rng 01/25/2015 03/08/2015 03/21/2015 04/18/2015 05/23/2015  CEA     0.0 - 4.7 ng/mL 4.8 (H) 6.0 (H) 5.0 (H) 4.2 4.7   Component     Latest Ref Rng 06/13/2015  CEA     0.0 - 4.7 ng/mL 4.4   CEA on September 7  3.5   ASSESSMENT: Stage IV carcinoma of colon Proceed with chemotherapy In view of prolonged nausea we will proceed with some only 5-FU leucovorin chemotherapy closely monitoring CT and PET scan.  MEDICAL DECISION MAKING:  Continue oxaliplatin-based chemotherapy with desensitizing patient protocol so far patient has not developed any significant side effect or hypersensitivity reaction I will be available throughout patient's chemotherapy tomorrow for 7 or 8 hours because of desensitizing protocol and expected hypersensitivity reaction. Total duration of visit was 38minutes.  50% or more time was spent in counseling patient and family regarding prognosis and options of treatment and available resources  Patient expressed understanding and was in  agreement with this plan. She also understands that She can call clinic at any time with any questions, concerns, or complaints.   CEA slowly declining Continue  chemotherapy Colon cancer   Staging form: Colon and Rectum, AJCC 7th Edition     Clinical: Stage IVB (yT3, N1, M1b) - Signed by [name], MD on 03/07/2015   [name], MD   07/04/2015 9:23 AM

## 2015-07-04 NOTE — Progress Notes (Signed)
Patient does not have living will.  Never smoked.  Patient complains of mid back pain for past one week. Patient had nausea for 4 days post treatment.  Gets SOB if she has to walk long distances.

## 2015-07-05 ENCOUNTER — Inpatient Hospital Stay: Payer: 59

## 2015-07-05 VITALS — BP 110/76 | HR 83 | Temp 97.4°F | Resp 18

## 2015-07-05 DIAGNOSIS — C189 Malignant neoplasm of colon, unspecified: Secondary | ICD-10-CM

## 2015-07-05 DIAGNOSIS — C185 Malignant neoplasm of splenic flexure: Secondary | ICD-10-CM | POA: Diagnosis not present

## 2015-07-05 LAB — CEA: CEA: 3.5 ng/mL (ref 0.0–4.7)

## 2015-07-05 MED ORDER — DEXTROSE 5 % IV SOLN
Freq: Once | INTRAVENOUS | Status: AC
  Administered 2015-07-05: 09:00:00 via INTRAVENOUS
  Filled 2015-07-05: qty 1000

## 2015-07-05 MED ORDER — SODIUM CHLORIDE 0.9 % IV SOLN
Freq: Once | INTRAVENOUS | Status: AC
  Administered 2015-07-05: 09:00:00 via INTRAVENOUS
  Filled 2015-07-05: qty 4

## 2015-07-05 MED ORDER — FLUOROURACIL CHEMO INJECTION 2.5 GM/50ML
400.0000 mg/m2 | Freq: Once | INTRAVENOUS | Status: AC
  Administered 2015-07-05: 800 mg via INTRAVENOUS
  Filled 2015-07-05: qty 16

## 2015-07-05 MED ORDER — LEUCOVORIN CALCIUM INJECTION 100 MG
20.0000 mg/m2 | Freq: Once | INTRAMUSCULAR | Status: AC
  Administered 2015-07-05: 40 mg via INTRAVENOUS
  Filled 2015-07-05: qty 2

## 2015-07-05 MED ORDER — LEUCOVORIN CALCIUM INJECTION 350 MG: 400.0000 mg/m2 | Freq: Once | INTRAMUSCULAR | Status: DC

## 2015-07-05 MED ORDER — SODIUM CHLORIDE 0.9 % IJ SOLN
10.0000 mL | INTRAMUSCULAR | Status: DC | PRN
  Administered 2015-07-05: 10 mL
  Filled 2015-07-05: qty 10

## 2015-07-05 MED ORDER — FLUOROURACIL CHEMO INJECTION 5 GM/100ML
2000.0000 mg/m2 | INTRAVENOUS | Status: DC
  Administered 2015-07-05: 4000 mg via INTRAVENOUS
  Filled 2015-07-05: qty 80

## 2015-07-07 ENCOUNTER — Inpatient Hospital Stay: Payer: 59

## 2015-07-07 VITALS — BP 112/72 | HR 88 | Temp 97.8°F | Resp 18

## 2015-07-07 DIAGNOSIS — C185 Malignant neoplasm of splenic flexure: Secondary | ICD-10-CM | POA: Diagnosis not present

## 2015-07-07 MED ORDER — HEPARIN SOD (PORK) LOCK FLUSH 100 UNIT/ML IV SOLN
500.0000 [IU] | Freq: Once | INTRAVENOUS | Status: AC | PRN
  Administered 2015-07-07: 500 [IU]

## 2015-07-07 MED ORDER — SODIUM CHLORIDE 0.9 % IJ SOLN
10.0000 mL | INTRAMUSCULAR | Status: DC | PRN
  Administered 2015-07-07: 10 mL
  Filled 2015-07-07: qty 10

## 2015-07-23 ENCOUNTER — Telehealth: Payer: Self-pay | Admitting: *Deleted

## 2015-07-23 DIAGNOSIS — C189 Malignant neoplasm of colon, unspecified: Secondary | ICD-10-CM

## 2015-07-23 NOTE — Telephone Encounter (Signed)
CT ordered Left message on vm that CT is being ordered

## 2015-07-23 NOTE — Telephone Encounter (Signed)
Back pain getting worse over weekend andis using Tylenol q 8 hours

## 2015-07-25 ENCOUNTER — Inpatient Hospital Stay (HOSPITAL_BASED_OUTPATIENT_CLINIC_OR_DEPARTMENT_OTHER): Payer: 59 | Admitting: Oncology

## 2015-07-25 ENCOUNTER — Other Ambulatory Visit: Payer: 59

## 2015-07-25 ENCOUNTER — Ambulatory Visit
Admission: RE | Admit: 2015-07-25 | Discharge: 2015-07-25 | Disposition: A | Discharge status: Home / Self Care | Payer: 59 | Source: Ambulatory Visit | Attending: Oncology | Admitting: Oncology

## 2015-07-25 ENCOUNTER — Inpatient Hospital Stay: Payer: 59

## 2015-07-25 ENCOUNTER — Encounter: Payer: Self-pay | Admitting: Oncology

## 2015-07-25 ENCOUNTER — Ambulatory Visit: Payer: 59 | Admitting: Oncology

## 2015-07-25 VITALS — BP 118/75 | HR 78 | Temp 97.9°F | Wt 199.5 lb

## 2015-07-25 DIAGNOSIS — M549 Dorsalgia, unspecified: Secondary | ICD-10-CM

## 2015-07-25 DIAGNOSIS — Z888 Allergy status to other drugs, medicaments and biological substances status: Secondary | ICD-10-CM

## 2015-07-25 DIAGNOSIS — Z7952 Long term (current) use of systemic steroids: Secondary | ICD-10-CM

## 2015-07-25 DIAGNOSIS — C185 Malignant neoplasm of splenic flexure: Secondary | ICD-10-CM

## 2015-07-25 DIAGNOSIS — C787 Secondary malignant neoplasm of liver and intrahepatic bile duct: Secondary | ICD-10-CM | POA: Diagnosis not present

## 2015-07-25 DIAGNOSIS — C189 Malignant neoplasm of colon, unspecified: Secondary | ICD-10-CM | POA: Insufficient documentation

## 2015-07-25 DIAGNOSIS — Z79899 Other long term (current) drug therapy: Secondary | ICD-10-CM

## 2015-07-25 LAB — COMPREHENSIVE METABOLIC PANEL
ALBUMIN: 3.6 g/dL (ref 3.5–5.0)
ALT: 11 U/L — AB (ref 14–54)
AST: 22 U/L (ref 15–41)
Alkaline Phosphatase: 59 U/L (ref 38–126)
Anion gap: 5 (ref 5–15)
BUN: 6 mg/dL (ref 6–20)
CHLORIDE: 108 mmol/L (ref 101–111)
CO2: 25 mmol/L (ref 22–32)
CREATININE: 0.67 mg/dL (ref 0.44–1.00)
Calcium: 8.4 mg/dL — ABNORMAL LOW (ref 8.9–10.3)
GFR calc Af Amer: >60 mL/min (ref >60)
GFR calc non Af Amer: >60 mL/min (ref >60)
Glucose, Bld: 98 mg/dL (ref 65–99)
POTASSIUM: 3.6 mmol/L (ref 3.5–5.1)
SODIUM: 138 mmol/L (ref 135–145)
Total Bilirubin: 0.5 mg/dL (ref 0.3–1.2)
Total Protein: 6.8 g/dL (ref 6.5–8.1)

## 2015-07-25 LAB — CBC WITH DIFFERENTIAL/PLATELET
BASOS ABS: 0 10*3/uL (ref 0–0.1)
BASOS PCT: 1 %
EOS ABS: 0.2 10*3/uL (ref 0–0.7)
EOS PCT: 5 %
HCT: 35.9 % (ref 35.0–47.0)
Hemoglobin: 12.1 g/dL (ref 12.0–16.0)
LYMPHS PCT: 35 %
Lymphs Abs: 1.3 10*3/uL (ref 1.0–3.6)
MCH: 29.8 pg (ref 26.0–34.0)
MCHC: 33.7 g/dL (ref 32.0–36.0)
MCV: 88.6 fL (ref 80.0–100.0)
Monocytes Absolute: 0.5 10*3/uL (ref 0.2–0.9)
Monocytes Relative: 12 %
Neutro Abs: 1.7 10*3/uL (ref 1.4–6.5)
Neutrophils Relative %: 47 %
PLATELETS: 217 10*3/uL (ref 150–440)
RBC: 4.05 MIL/uL (ref 3.80–5.20)
RDW: 15.1 % — AB (ref 11.5–14.5)
WBC: 3.8 10*3/uL (ref 3.6–11.0)

## 2015-07-25 LAB — MAGNESIUM: Magnesium: 1.9 mg/dL (ref 1.7–2.4)

## 2015-07-25 MED ORDER — HYDROCODONE-ACETAMINOPHEN 5-325 MG PO TABS: 1.0000 | ORAL_TABLET | Freq: Four times a day (QID) | ORAL | Status: DC | PRN

## 2015-07-25 MED ORDER — TECHNETIUM TC 99M MEDRONATE IV KIT
25.0000 | PACK | Freq: Once | INTRAVENOUS | Status: AC | PRN
  Administered 2015-07-25: 23.16 via INTRAVENOUS

## 2015-07-25 NOTE — Progress Notes (Signed)
[place] @ [facility] [phone]  Fax:([phone] [place]: Jul 16, 1971  MR#: [id number]  JYN#:[id number]  Patient Care Team: Lynnell Jude, MD as PCP - General (Family Medicine)  CHIEF COMPLAINT:  Chief Complaint  Patient presents with  . OTHER   Oncology History   1. Carcinoma of the colon, splenic  flexure.   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 2. K-RAS mutated. 3. Patient started on FOLFOX and Avastin 4 th June, 2015 4. Allergic reaction  characterized by generalized itching and redness  of face and palm on June 15, 2014 5. Started on FOLFIRI  and avastin   July 13, 2014 6. Finished 12 cycles of chemotherapy, 5-FU based FOLFOX was changed to FOLFIRI BECAUSE OF SENSITIVITY TO OXALIPLATIN(September 07, 2014) 7. Started on maintenance chemotherapy with 5-FU leucovorin and Avastin from September 28, 2014 8.  Progressive disease based on tumor markers as well as PET scan's so patient was started on FOLFOX chemotherapy with desensitizing  protocol.      Oncology Flowsheet 06/14/2015 06/14/2015 06/14/2015 06/14/2015 06/14/2015 07/05/2015 07/05/2015  Day, Cycle Day 1, Cycle 3         Day 1, Cycle 1    dexamethasone (DECADRON) IV [ 10 mg ]         [ 10 mg ]    fluorouracil (ADRUCIL) IV 750 mg 3,600 mg       400 mg/m2 2,000 mg/m2  fosaprepitant (EMEND) IV - - - - - - -  leucovorin IV 750 mg         20 mg/m2    methylPREDNISolone sodium succinate 125 mg/2 mL (SOLU-MEDROL) IV 40 mg 40 mg 40 mg 40 mg 40 mg - -  ondansetron (ZOFRAN) IV [ 8 mg ]         [ 8 mg ]    oxaliplatin (ELOXATIN) IV 165.75 mg 165.75 mg 165.75 mg 165.75 mg 165.75 mg - -  palonosetron (ALOXI) IV - - - - - - -    INTERVAL HISTORY: 44 year old lady with stage IV carcinoma of colon came today further follow-up.   Patient is tolerating oxaliplatin with desensitizing protocol.  No chills.  No  fever.  Tingling numbness in upper and lower extremity is intermittent.  Does not impair with motor function as been stable.  No tingling.  No numbness.  Feeling somewhat depressed because of persistent nausea.  July 25, 2015 Patient with a stage IV carcinoma of  Colon   Presented with onset of mid back pain pain is constant dull aching patient is taking Tylenol around-the-clock for pain.  Started few weeks ago patient does not remember having any injury.  Pain radiates in the front.  No nausea.  Patient had nausea following last chemotherapy.  Has been scheduled for bone scan and CT scan today Therapy for tomorrow is going to be hold until etiology of back pain is defined REVIEW OF SYSTEMS:   GENERAL:  Feels good.  Active.  No fevers, sweats or weight loss. PERFORMANCE STATUS (ECOG): 0 HEENT:  No visual changes, runny nose, sore throat, mouth sores or tenderness. Lungs: No shortness of breath or cough.  No hemoptysis. Cardiac:  No chest pain, palpitations, orthopnea, or PND. GI: Nausea following chemotherapy lasting for more than 24 hours which is now resolved.  No diarrhea. GU:  No urgency, frequency, dysuria, or  hematuria. Musculoskeletal:  Back pain as described above Extremities:  No pain or swelling. Skin:  No rashes or skin changes. Neuro:  No headache, numbness or weakness, balance or coordination issues. Endocrine:  No diabetes, thyroid issues, hot flashes or night sweats. Psych:  No mood changes, depression or anxiety. Pain:  No focal pain. Review of systems:  All other systems reviewed and found to be negative.  As per HPI. Otherwise, a complete review of systems is negatve.  PAST MEDICAL HISTORY: Past Medical History  Diagnosis Date  . Ulcer   . Anemia   . Cancer 2015    colon  . 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 On adjuvant chemotherapy  . Obesity   . Constipation   . Migraines     PAST  SURGICAL HISTORY: Past Surgical History  Procedure Laterality Date  . Cholecystectomy  2014  . Tubal ligation  2005  . Colonoscopy  2015  . Upper gi endoscopy  2015    FAMILY HISTORY No significant family history of malignancy       ADVANCED DIRECTIVES:   Patient does have advanced healthcare directive HEALTH MAINTENANCE: Social History  Substance Use Topics  . Smoking status: Never Smoker   . Smokeless tobacco: None  . Alcohol Use: No      Allergies  Allergen Reactions  . Oxaliplatin Itching    Current Outpatient Prescriptions  Medication Sig Dispense Refill  . acetaminophen (TYLENOL) 500 MG tablet Take 500 mg by mouth every 6 (six) hours as needed.    . diphenoxylate-atropine (LOMOTIL) 2.5-0.025 MG per tablet Take 1 tablet by mouth 4 (four) times daily as needed for diarrhea or loose stools (2 tabs to start, then one by mouth every 6 hours if needed for diarrhea.). 30 tablet 0  . ferrous fumarate-iron polysaccharide complex (TANDEM) 162-115.2 MG CAPS Take 1 capsule by mouth 2 (two) times daily.    Marland Kitchen gabapentin (NEURONTIN) 300 MG capsule Take 300 mg by mouth 2 (two) times daily.    Marland Kitchen ibuprofen (ADVIL,MOTRIN) 800 MG tablet Take 800 mg by mouth 3 (three) times daily.    Marland Kitchen lidocaine (LIDODERM) 5 % Place 1 patch onto the skin daily. Remove & Discard patch within 12 hours or as directed by MD    . methocarbamol (ROBAXIN) 750 MG tablet Take 750 mg by mouth 2 (two) times daily. 2 tabs    . ondansetron (ZOFRAN-ODT) 4 MG disintegrating tablet Take 1 tablet (4 mg total) by mouth 4 (four) times daily as needed for nausea or vomiting. 30 tablet 0  . pantoprazole (PROTONIX) 40 MG tablet Take 40 mg by mouth 2 (two) times daily.    . polyethylene glycol (MIRALAX / GLYCOLAX) packet Take 17 g by mouth daily.    . potassium chloride SA (K-DUR,KLOR-CON) 20 MEQ tablet Take 1 tablet (20 mEq total) by mouth daily. 30 tablet 3  . predniSONE (DELTASONE) 50 MG tablet     . promethazine  (PHENERGAN) 25 MG suppository Place 1 suppository (25 mg total) rectally every 6 (six) hours as needed for nausea or vomiting. 6 each 0  . sulfamethoxazole-trimethoprim (BACTRIM DS,SEPTRA DS) 800-160 MG per tablet Take 1 tablet by mouth 2 (two) times daily.    . traMADol (ULTRAM) 50 MG tablet Take 50 mg by mouth every 4 (four) hours.    . valACYclovir (VALTREX) 1000 MG tablet Take 1,000 mg by mouth 2 (two) times daily.  No current facility-administered medications for this visit.   Facility-Administered Medications Ordered in Other Visits  Medication Dose Route Frequency Provider Last Rate Last Dose  . sodium chloride 0.9 % injection 10 mL  10 mL Intracatheter PRN [name], MD   10 mL at 03/21/15 1025  . sodium chloride 0.9 % injection 10 mL  10 mL Intravenous PRN [name], MD   10 mL at 04/21/15 1025    OBJECTIVE:  Filed Vitals:   07/25/15 0925  BP: 118/75  Pulse: 78  Temp: 97.9 F (36.6 C)     Body mass index is 35.35 kg/(m^2).    ECOG FS:0 - Asymptomatic  PHYSICAL EXAM: GENERAL:  Well developed, well nourished, sitting comfortably in the exam room in no acute distress. MENTAL STATUS:  Alert and oriented to person, place and time. HEAD:  .  Normocephalic, atraumatic, face symmetric, no Cushingoid features. EYES:   Pupils equal round and reactive to light and accomodation.  No conjunctivitis or scleral icterus. ENT:  Oropharynx clear without lesion.  Tongue normal. Mucous membranes moist.  RESPIRATORY:  Clear to auscultation without rales, wheezes or rhonchi. CARDIOVASCULAR:  Regular rate and rhythm without murmur, rub or gallop. BREAST:  Right breast without masses, skin changes or nipple discharge.  Left breast without masses, skin changes or nipple discharge. ABDOMEN:  Soft, non-tender, with active bowel sounds, and no hepatosplenomegaly.  No masses. BACK:  No CVA tenderness.  No tenderness on percussion of the back or rib cage. SKIN:  No rashes, ulcers or  lesions. EXTREMITIES: No edema, no skin discoloration or tenderness.  No palpable cords. LYMPH NODES: No palpable cervical, supraclavicular, axillary or inguinal adenopathy  NEUROLOGICAL: Unremarkable. PSYCH:  Appropriate.   LAB RESULTS:  Appointment on 07/25/2015  Component Date Value Ref Range Status  . WBC 07/25/2015 3.8  3.6 - 11.0 K/uL Final  . RBC 07/25/2015 4.05  3.80 - 5.20 MIL/uL Final  . Hemoglobin 07/25/2015 12.1  12.0 - 16.0 g/dL Final  . HCT 07/25/2015 35.9  35.0 - 47.0 % Final  . MCV 07/25/2015 88.6  80.0 - 100.0 fL Final  . MCH 07/25/2015 29.8  26.0 - 34.0 pg Final  . MCHC 07/25/2015 33.7  32.0 - 36.0 g/dL Final  . RDW 07/25/2015 15.1* 11.5 - 14.5 % Final  . Platelets 07/25/2015 217  150 - 440 K/uL Final  . Neutrophils Relative % 07/25/2015 47   Final  . Neutro Abs 07/25/2015 1.7  1.4 - 6.5 K/uL Final  . Lymphocytes Relative 07/25/2015 35   Final  . Lymphs Abs 07/25/2015 1.3  1.0 - 3.6 K/uL Final  . Monocytes Relative 07/25/2015 12   Final  . Monocytes Absolute 07/25/2015 0.5  0.2 - 0.9 K/uL Final  . Eosinophils Relative 07/25/2015 5   Final  . Eosinophils Absolute 07/25/2015 0.2  0 - 0.7 K/uL Final  . Basophils Relative 07/25/2015 1   Final  . Basophils Absolute 07/25/2015 0.0  0 - 0.1 K/uL Final  . Sodium 07/25/2015 138  135 - 145 mmol/L Final  . Potassium 07/25/2015 3.6  3.5 - 5.1 mmol/L Final  . Chloride 07/25/2015 108  101 - 111 mmol/L Final  . CO2 07/25/2015 25  22 - 32 mmol/L Final  . Glucose, Bld 07/25/2015 98  65 - 99 mg/dL Final  . BUN 07/25/2015 6  6 - 20 mg/dL Final  . Creatinine, Ser 07/25/2015 0.67  0.44 - 1.00 mg/dL Final  . Calcium 07/25/2015 8.4* 8.9 - 10.3 mg/dL  Final  . Total Protein 07/25/2015 6.8  6.5 - 8.1 g/dL Final  . Albumin 07/25/2015 3.6  3.5 - 5.0 g/dL Final  . AST 07/25/2015 22  15 - 41 U/L Final  . ALT 07/25/2015 11* 14 - 54 U/L Final  . Alkaline Phosphatase 07/25/2015 59  38 - 126 U/L Final  . Total Bilirubin 07/25/2015 0.5  0.3  - 1.2 mg/dL Final  . GFR calc non Af Amer 07/25/2015 >60  >60 mL/min Final  . GFR calc Af Amer 07/25/2015 >60  >60 mL/min Final   Comment: (NOTE) The eGFR has been calculated using the CKD EPI equation. This calculation has not been validated in all clinical situations. eGFR's persistently <60 mL/min signify possible Chronic Kidney Disease.   . Anion gap 07/25/2015 5  5 - 15 Final  . Magnesium 07/25/2015 1.9  1.7 - 2.4 mg/dL Final    No results found for: LABCA2 Lab Results  Component Value Date   CA199 <1 02/03/2014   Lab Results  Component Value Date   CEA 3.5 07/04/2015   Component     Latest Ref Rng 01/25/2015 03/08/2015 03/21/2015 04/18/2015 05/23/2015  CEA     0.0 - 4.7 ng/mL 4.8 (H) 6.0 (H) 5.0 (H) 4.2 4.7   Component     Latest Ref Rng 06/13/2015  CEA     0.0 - 4.7 ng/mL 4.4   CEA on September 7  3.5  FINDINGS: No abnormal sites of radiopharmaceutical uptake are seen within the axial or appendicular skeleton to suggest the presence of bone metastases. No sites of abnormal radiopharmaceutical uptake noted within the spine. Urinary excretion of activity is normal.  IMPRESSION: Negative. No evidence of osseous metastatic disease.   Electronically Signed  By: Earle Gell M.D.  On: 07/25/2015 14:11  ASSESSMENT: Stage IV carcinoma of colon   Patient started with new onset of back pain Patient was advised to take hydrocodone on round the clock to relieve pain Bone scan has been reviewed which was negative for any metastases disease A CT scan has been ordered and will be evaluated Hold chemotherapy until back pain etiology is further defined    Continue  chemotherapy Colon cancer   Staging form: Colon and Rectum, AJCC 7th Edition     Clinical: Stage IVB (yT3, N1, M1b) - Signed by [name], MD on 03/07/2015   [name], MD   07/25/2015 9:39 AM

## 2015-07-25 NOTE — Progress Notes (Signed)
Patient does not have living will.  Never smoked. 

## 2015-07-26 ENCOUNTER — Encounter: Payer: Self-pay | Admitting: Oncology

## 2015-07-26 ENCOUNTER — Ambulatory Visit: Payer: 59

## 2015-07-26 LAB — CEA: CEA: 4.3 ng/mL (ref 0.0–4.7)

## 2015-07-27 ENCOUNTER — Ambulatory Visit
Admission: RE | Admit: 2015-07-27 | Discharge: 2015-07-27 | Disposition: A | Discharge status: Home / Self Care | Payer: 59 | Source: Ambulatory Visit | Attending: Oncology | Admitting: Oncology

## 2015-07-27 DIAGNOSIS — C189 Malignant neoplasm of colon, unspecified: Secondary | ICD-10-CM | POA: Diagnosis present

## 2015-07-27 DIAGNOSIS — R590 Localized enlarged lymph nodes: Secondary | ICD-10-CM | POA: Diagnosis not present

## 2015-07-27 MED ORDER — IOHEXOL 300 MG/ML  SOLN
100.0000 mL | Freq: Once | INTRAMUSCULAR | Status: AC | PRN
  Administered 2015-07-27: 100 mL via INTRAVENOUS

## 2015-07-28 ENCOUNTER — Inpatient Hospital Stay: Payer: 59

## 2015-07-30 ENCOUNTER — Inpatient Hospital Stay: Payer: 59 | Attending: Oncology | Admitting: Oncology

## 2015-07-30 VITALS — BP 109/71 | HR 88 | Temp 97.9°F | Wt 196.0 lb

## 2015-07-30 DIAGNOSIS — Z9221 Personal history of antineoplastic chemotherapy: Secondary | ICD-10-CM | POA: Diagnosis not present

## 2015-07-30 DIAGNOSIS — C189 Malignant neoplasm of colon, unspecified: Secondary | ICD-10-CM

## 2015-07-30 DIAGNOSIS — Z79899 Other long term (current) drug therapy: Secondary | ICD-10-CM | POA: Insufficient documentation

## 2015-07-30 DIAGNOSIS — C185 Malignant neoplasm of splenic flexure: Secondary | ICD-10-CM | POA: Insufficient documentation

## 2015-07-30 DIAGNOSIS — R21 Rash and other nonspecific skin eruption: Secondary | ICD-10-CM | POA: Diagnosis not present

## 2015-07-30 DIAGNOSIS — M549 Dorsalgia, unspecified: Secondary | ICD-10-CM

## 2015-07-30 DIAGNOSIS — C787 Secondary malignant neoplasm of liver and intrahepatic bile duct: Secondary | ICD-10-CM | POA: Insufficient documentation

## 2015-07-30 DIAGNOSIS — Z888 Allergy status to other drugs, medicaments and biological substances status: Secondary | ICD-10-CM | POA: Diagnosis not present

## 2015-07-30 DIAGNOSIS — R590 Localized enlarged lymph nodes: Secondary | ICD-10-CM | POA: Diagnosis not present

## 2015-07-30 DIAGNOSIS — Z7952 Long term (current) use of systemic steroids: Secondary | ICD-10-CM | POA: Insufficient documentation

## 2015-07-30 MED ORDER — FENTANYL 12 MCG/HR TD PT72: 12.5000 ug | MEDICATED_PATCH | TRANSDERMAL | Status: DC

## 2015-07-30 NOTE — Progress Notes (Signed)
Patient does not have living will.  Never smoked.  Patient reports breaking out in hives after taking Norco.  Added to allergy list.  Will needs a different prescription for pain.

## 2015-07-31 ENCOUNTER — Telehealth: Payer: Self-pay | Admitting: *Deleted

## 2015-07-31 DIAGNOSIS — C189 Malignant neoplasm of colon, unspecified: Secondary | ICD-10-CM

## 2015-07-31 MED ORDER — TRAMADOL HCL 50 MG PO TABS: 50.0000 mg | ORAL_TABLET | ORAL | Status: DC

## 2015-07-31 NOTE — Telephone Encounter (Signed)
Put patch on last night for first time and awoke this morning with her lips swollen. Took Benadryl. Take patch off, Order Tramadol 50mg  and take tramadol and regular Tylenol together for pain per Dr Oliva Bustard I called pt and she said she already took patch off, She will pick up rx at [facility] in [facility] and take with tylenol

## 2015-08-01 ENCOUNTER — Telehealth: Payer: Self-pay | Admitting: *Deleted

## 2015-08-01 NOTE — Telephone Encounter (Signed)
Prescription should be written for twice a day

## 2015-08-01 NOTE — Telephone Encounter (Signed)
Called Jasmine @ [facility] and informed her that prescription should be for 2 times a day.

## 2015-08-01 NOTE — Telephone Encounter (Signed)
Received prescription for Lonsurf.  Written for 15 mg - 2 tablets po daily then 20 mg - 2 tablets po daily. Standard dosage is 2 times daily.  Please clarify prescription. [place]

## 2015-08-06 ENCOUNTER — Encounter: Payer: Self-pay | Admitting: Oncology

## 2015-08-06 ENCOUNTER — Encounter: Payer: Self-pay | Admitting: *Deleted

## 2015-08-06 ENCOUNTER — Telehealth: Payer: Self-pay | Admitting: *Deleted

## 2015-08-06 NOTE — Telephone Encounter (Signed)
Heather needs to verify prescription for patient.  Please call back @ [phone].  Reference #[id number]

## 2015-08-06 NOTE — Progress Notes (Unsigned)
lonsurf Rx transferred to [facility] per fax notification.

## 2015-08-06 NOTE — Progress Notes (Signed)
[place] @ [facility] [phone]  Fax:([phone]) [place] OB: 02-07-1971  MR#: [id number]  IHW#:[id number]  Patient Care Team: Lynnell Jude, MD as PCP - General (Family Medicine)  CHIEF COMPLAINT:  Chief Complaint  Patient presents with  . OTHER    Hives   Oncology History   1. Carcinoma of the colon, splenic  flexure.   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 2. K-RAS mutated. 3. Patient started on FOLFOX and Avastin 4 th June, 2015 4. Allergic reaction  characterized by generalized itching and redness  of face and palm on June 15, 2014 5. Started on FOLFIRI  and avastin   July 13, 2014 6. Finished 12 cycles of chemotherapy, 5-FU based FOLFOX was changed to FOLFIRI BECAUSE OF SENSITIVITY TO OXALIPLATIN(September 07, 2014) 7. Started on maintenance chemotherapy with 5-FU leucovorin and Avastin from September 28, 2014 8.  Progressive disease based on tumor markers as well as PET scan's so patient was started on FOLFOX chemotherapy with desensitizing  protocol.   9.  Progressing disease by CT scan and by symptoms.  Patient will be started on TAS-102 from October, 2016   Oncology Flowsheet 06/14/2015 06/14/2015 06/14/2015 06/14/2015 06/14/2015 07/05/2015 07/05/2015  Day, Cycle Day 1, Cycle 3         Day 1, Cycle 1    dexamethasone (DECADRON) IV [ 10 mg ]         [ 10 mg ]    fluorouracil (ADRUCIL) IV 750 mg 3,600 mg       400 mg/m2 2,000 mg/m2  fosaprepitant (EMEND) IV - - - - - - -  leucovorin IV 750 mg         20 mg/m2    methylPREDNISolone sodium succinate 125 mg/2 mL (SOLU-MEDROL) IV 40 mg 40 mg 40 mg 40 mg 40 mg - -  ondansetron (ZOFRAN) IV [ 8 mg ]         [ 8 mg ]    oxaliplatin (ELOXATIN) IV 165.75 mg 165.75 mg 165.75 mg 165.75 mg 165.75 mg - -  palonosetron (ALOXI) IV - - - - - - -    INTERVAL HISTORY: \  44 year old lady came today for further  follow-up regarding increasing back pain.  Patient has developed allergic reaction with hives with hydrocodone and oxycodone.  Patient was started on fentanyl patch.  Patient had a bone scan and a CT scan done which has been reviewed. Appetite has been stable.  No nausea.  No vomiting.  No diarrhea. REVIEW OF SYSTEMS:   GENERAL:  Feels good.  Active.  No fevers, sweats or weight loss. PERFORMANCE STATUS (ECOG): 0 HEENT:  No visual changes, runny nose, sore throat, mouth sores or tenderness. Lungs: No shortness of breath or cough.  No hemoptysis. Cardiac:  No chest pain, palpitations, orthopnea, or PND. GI: Nausea following chemotherapy lasting for more than 24 hours which is now resolved.  No diarrhea. GU:  No urgency, frequency, dysuria, or hematuria. Musculoskeletal:  Back pain as described above Extremities:  No pain or swelling. Skin:  No rashes or skin changes. Neuro:  No headache, numbness or weakness, balance or coordination issues. Endocrine:  No diabetes, thyroid issues, hot flashes or night sweats. Psych:  No mood changes, depression or anxiety. Pain:  No focal pain. Review of systems:  All other systems reviewed and found  to be negative.  As per HPI. Otherwise, a complete review of systems is negatve.  PAST MEDICAL HISTORY: Past Medical History  Diagnosis Date  . Ulcer   . Anemia   . Obesity   . Constipation   . Migraines   . Cancer ([place]) 2015    colon  . Colon cancer metastasized to liver [facility]) April 2015    T3, N2, M1, stage IV with biopsy-proven liver metastases. On adjuvant chemotherapy    PAST SURGICAL HISTORY: Past Surgical History  Procedure Laterality Date  . Cholecystectomy  2014  . Tubal ligation  2005  . Colonoscopy  2015  . Upper gi endoscopy  2015    FAMILY HISTORY No significant family history of malignancy       ADVANCED DIRECTIVES:   Patient does have advanced healthcare directive HEALTH MAINTENANCE: Social History  Substance Use  Topics  . Smoking status: Never Smoker   . Smokeless tobacco: None  . Alcohol Use: No      Allergies  Allergen Reactions  . Fentanyl Swelling    Swelling of lips  . Oxaliplatin Itching  . Hydrocodone-Acetaminophen Hives    Current Outpatient Prescriptions  Medication Sig Dispense Refill  . acetaminophen (TYLENOL) 500 MG tablet Take 500 mg by mouth every 6 (six) hours as needed.    . diphenoxylate-atropine (LOMOTIL) 2.5-0.025 MG per tablet Take 1 tablet by mouth 4 (four) times daily as needed for diarrhea or loose stools (2 tabs to start, then one by mouth every 6 hours if needed for diarrhea.). 30 tablet 0  . ferrous fumarate-iron polysaccharide complex (TANDEM) 162-115.2 MG CAPS Take 1 capsule by mouth 2 (two) times daily.    Marland Kitchen gabapentin (NEURONTIN) 300 MG capsule Take 300 mg by mouth 2 (two) times daily.    Marland Kitchen ibuprofen (ADVIL,MOTRIN) 800 MG tablet Take 800 mg by mouth 3 (three) times daily.    Marland Kitchen lidocaine (LIDODERM) 5 % Place 1 patch onto the skin daily. Remove & Discard patch within 12 hours or as directed by MD    . methocarbamol (ROBAXIN) 750 MG tablet Take 750 mg by mouth 2 (two) times daily. 2 tabs    . ondansetron (ZOFRAN-ODT) 4 MG disintegrating tablet Take 1 tablet (4 mg total) by mouth 4 (four) times daily as needed for nausea or vomiting. 30 tablet 0  . pantoprazole (PROTONIX) 40 MG tablet Take 40 mg by mouth 2 (two) times daily.    . polyethylene glycol (MIRALAX / GLYCOLAX) packet Take 17 g by mouth daily.    . potassium chloride SA (K-DUR,KLOR-CON) 20 MEQ tablet Take 1 tablet (20 mEq total) by mouth daily. 30 tablet 3  . predniSONE (DELTASONE) 50 MG tablet     . promethazine (PHENERGAN) 25 MG suppository Place 1 suppository (25 mg total) rectally every 6 (six) hours as needed for nausea or vomiting. 6 each 0  . sulfamethoxazole-trimethoprim (BACTRIM DS,SEPTRA DS) 800-160 MG per tablet Take 1 tablet by mouth 2 (two) times daily.    . valACYclovir (VALTREX) 1000 MG tablet  Take 1,000 mg by mouth 2 (two) times daily.    Marland Kitchen HYDROcodone-acetaminophen (NORCO/VICODIN) 5-325 MG tablet TAKE 1 TABLET BY MOUTH EVERY 6 HOURS AS NEEDED FOR MODERATE PAIN  0  . traMADol (ULTRAM) 50 MG tablet Take 1 tablet (50 mg total) by mouth every 4 (four) hours. Take with Tyenol 325 mg tablet 90 tablet 1   No current facility-administered medications for this visit.   Facility-Administered Medications Ordered in Other Visits  Medication Dose Route Frequency Provider Last Rate Last Dose  . sodium chloride 0.9 % injection 10 mL  10 mL Intracatheter PRN [name], MD   10 mL at 03/21/15 1025  . sodium chloride 0.9 % injection 10 mL  10 mL Intravenous PRN [name], MD   10 mL at 04/21/15 1025    OBJECTIVE:  Filed Vitals:   07/30/15 1549  BP: 109/71  Pulse: 88  Temp: 97.9 F (36.6 C)     Body mass index is 34.73 kg/(m^2).    ECOG FS:0 - Asymptomatic  PHYSICAL EXAM: GENERAL:  Well developed, well nourished, sitting comfortably in the exam room in no acute distress. MENTAL STATUS:  Alert and oriented to person, place and time. HEAD:  .  Normocephalic, atraumatic, face symmetric, no Cushingoid features. EYES:   Pupils equal round and reactive to light and accomodation.  No conjunctivitis or scleral icterus. ENT:  Oropharynx clear without lesion.  Tongue normal. Mucous membranes moist.  RESPIRATORY:  Clear to auscultation without rales, wheezes or rhonchi. CARDIOVASCULAR:  Regular rate and rhythm without murmur, rub or gallop. BREAST:  Right breast without masses, skin changes or nipple discharge.  Left breast without masses, skin changes or nipple discharge. ABDOMEN:  Soft, non-tender, with active bowel sounds, and no hepatosplenomegaly.  No masses. BACK:  No CVA tenderness.  No tenderness on percussion of the back or rib cage. SKIN:  No rashes, ulcers or lesions. EXTREMITIES: No edema, no skin discoloration or tenderness.  No palpable cords. LYMPH NODES: No palpable cervical,  supraclavicular, axillary or inguinal adenopathy  NEUROLOGICAL: Unremarkable. PSYCH:  Appropriate.   LAB RESULTS:  No visits with results within 3 Day(s) from this visit. Latest known visit with results is:  Appointment on 07/25/2015  Component Date Value Ref Range Status  . WBC 07/25/2015 3.8  3.6 - 11.0 K/uL Final  . RBC 07/25/2015 4.05  3.80 - 5.20 MIL/uL Final  . Hemoglobin 07/25/2015 12.1  12.0 - 16.0 g/dL Final  . HCT 07/25/2015 35.9  35.0 - 47.0 % Final  . MCV 07/25/2015 88.6  80.0 - 100.0 fL Final  . MCH 07/25/2015 29.8  26.0 - 34.0 pg Final  . MCHC 07/25/2015 33.7  32.0 - 36.0 g/dL Final  . RDW 07/25/2015 15.1* 11.5 - 14.5 % Final  . Platelets 07/25/2015 217  150 - 440 K/uL Final  . Neutrophils Relative % 07/25/2015 47   Final  . Neutro Abs 07/25/2015 1.7  1.4 - 6.5 K/uL Final  . Lymphocytes Relative 07/25/2015 35   Final  . Lymphs Abs 07/25/2015 1.3  1.0 - 3.6 K/uL Final  . Monocytes Relative 07/25/2015 12   Final  . Monocytes Absolute 07/25/2015 0.5  0.2 - 0.9 K/uL Final  . Eosinophils Relative 07/25/2015 5   Final  . Eosinophils Absolute 07/25/2015 0.2  0 - 0.7 K/uL Final  . Basophils Relative 07/25/2015 1   Final  . Basophils Absolute 07/25/2015 0.0  0 - 0.1 K/uL Final  . Sodium 07/25/2015 138  135 - 145 mmol/L Final  . Potassium 07/25/2015 3.6  3.5 - 5.1 mmol/L Final  . Chloride 07/25/2015 108  101 - 111 mmol/L Final  . CO2 07/25/2015 25  22 - 32 mmol/L Final  . Glucose, Bld 07/25/2015 98  65 - 99 mg/dL Final  . BUN 07/25/2015 6  6 - 20 mg/dL Final  . Creatinine, Ser 07/25/2015 0.67  0.44 - 1.00 mg/dL Final  . Calcium 07/25/2015 8.4* 8.9 - 10.3  mg/dL Final  . Total Protein 07/25/2015 6.8  6.5 - 8.1 g/dL Final  . Albumin 07/25/2015 3.6  3.5 - 5.0 g/dL Final  . AST 07/25/2015 22  15 - 41 U/L Final  . ALT 07/25/2015 11* 14 - 54 U/L Final  . Alkaline Phosphatase 07/25/2015 59  38 - 126 U/L Final  . Total Bilirubin 07/25/2015 0.5  0.3 - 1.2 mg/dL Final  . GFR  calc non Af Amer 07/25/2015 >60  >60 mL/min Final  . GFR calc Af Amer 07/25/2015 >60  >60 mL/min Final   Comment: (NOTE) The eGFR has been calculated using the CKD EPI equation. This calculation has not been validated in all clinical situations. eGFR's persistently <60 mL/min signify possible Chronic Kidney Disease.   . Anion gap 07/25/2015 5  5 - 15 Final  . Magnesium 07/25/2015 1.9  1.7 - 2.4 mg/dL Final  . CEA 07/25/2015 4.3  0.0 - 4.7 ng/mL Final   Comment: (NOTE)       Roche ECLIA methodology       Nonsmokers  <3.9                                     Smokers     <5.6 Performed At: [facility] [place], [place] [id number] Lindon Romp MD SV:[id number]     No results found for: LABCA2 Lab Results  Component Value Date   CA199 <1 02/03/2014   Lab Results  Component Value Date   CEA 4.3 07/25/2015   Component     Latest Ref Rng 01/25/2015 03/08/2015 03/21/2015 04/18/2015 05/23/2015  CEA     0.0 - 4.7 ng/mL 4.8 (H) 6.0 (H) 5.0 (H) 4.2 4.7   Component     Latest Ref Rng 06/13/2015  CEA     0.0 - 4.7 ng/mL 4.4   CEA on September 7  3.5  FINDINGS: No abnormal sites of radiopharmaceutical uptake are seen within the axial or appendicular skeleton to suggest the presence of bone metastases. No sites of abnormal radiopharmaceutical uptake noted within the spine. Urinary excretion of activity is normal.  IMPRESSION: Negative. No evidence of osseous metastatic disease.   Electronically Signed  By: Earle Gell M.D.  On: 07/25/2015 14:11 IMPRESSION: 1. Increased in soft tissue density mass centered about the inferior aspect of the pancreatic body. Presumably secondary splenic vein occlusion. Given the clinical history of splenic flexure colonic primary, and the appearance on the prior exam, this could represent a progressive nodal metastasis. An exophytic pancreatic carcinoma or pancreatic parenchymal metastasis could look similar.  Endoscopic ultrasound with tissue sampling may be informative. 2. Developing adenopathy within the adjacent small bowel mesentery is suspicious for metastatic disease. 3. Possible constipation. These results will be called to the ordering clinician or representative by the Radiologist Assistant, and communication documented in the PACS or zVision Dashboard.   Electronically Signed  By: Abigail Miyamoto M.D.  On: 07/27/2015 10:33 ASSESSMENT: Stage IV carcinoma of colon CT scan has been reviewed independently shows progressive soft tissue density around peripancreatic area We will discuss this case in possibility of biopsy through EUS can be considered. Meanwhile we will stop chemotherapy and change treatment to  TAS-102. 35 mg/m 5 days a week for 2 weeks and 2 weeks off Various side effects of this treatment has been discussed with the patient and family 2.  Fentanyl patch has been prescribed and later  on we received a phone call that after fentanyl patch was started patient again developed rash Patient has been switched over to tramadol This will be discussing tumor conference Findings was discuss with the patient.  CT scan has been reviewed with the patient.    Continue  chemotherapy Colon cancer   Staging form: Colon and Rectum, AJCC 7th Edition     Clinical: Stage IVB (yT3, N1, M1b) - Signed by [name], MD on 03/07/2015   [name], MD   08/06/2015 9:11 AM

## 2015-08-07 NOTE — Telephone Encounter (Signed)
Prescription verified with [facility].

## 2015-08-14 ENCOUNTER — Other Ambulatory Visit: Payer: Self-pay | Admitting: *Deleted

## 2015-08-14 ENCOUNTER — Telehealth: Payer: Self-pay | Admitting: *Deleted

## 2015-08-14 DIAGNOSIS — C189 Malignant neoplasm of colon, unspecified: Secondary | ICD-10-CM

## 2015-08-14 NOTE — Telephone Encounter (Signed)
Tramadol and 1 tylenol is not controlling her back pain. Asking if she can take more than 1 tylenol with tramadol

## 2015-08-14 NOTE — Telephone Encounter (Signed)
Per Dr Oliva Bustard take Ibuprofen , tylenol and tramadol together. Patient informed and repeated back to me. She reports that she received her chemo pills today, but is going to wait until Monday to start since she is to take M-F and weekends off

## 2015-09-03 ENCOUNTER — Inpatient Hospital Stay (HOSPITAL_BASED_OUTPATIENT_CLINIC_OR_DEPARTMENT_OTHER): Payer: 59 | Admitting: Oncology

## 2015-09-03 ENCOUNTER — Inpatient Hospital Stay: Payer: 59 | Attending: Oncology

## 2015-09-03 VITALS — BP 119/66 | HR 77 | Temp 98.4°F | Wt 187.5 lb

## 2015-09-03 DIAGNOSIS — M549 Dorsalgia, unspecified: Secondary | ICD-10-CM

## 2015-09-03 DIAGNOSIS — Z9221 Personal history of antineoplastic chemotherapy: Secondary | ICD-10-CM | POA: Diagnosis not present

## 2015-09-03 DIAGNOSIS — K869 Disease of pancreas, unspecified: Secondary | ICD-10-CM | POA: Diagnosis not present

## 2015-09-03 DIAGNOSIS — Z79899 Other long term (current) drug therapy: Secondary | ICD-10-CM | POA: Diagnosis not present

## 2015-09-03 DIAGNOSIS — C787 Secondary malignant neoplasm of liver and intrahepatic bile duct: Secondary | ICD-10-CM | POA: Diagnosis not present

## 2015-09-03 DIAGNOSIS — Z23 Encounter for immunization: Secondary | ICD-10-CM | POA: Insufficient documentation

## 2015-09-03 DIAGNOSIS — Z888 Allergy status to other drugs, medicaments and biological substances status: Secondary | ICD-10-CM | POA: Diagnosis not present

## 2015-09-03 DIAGNOSIS — C189 Malignant neoplasm of colon, unspecified: Secondary | ICD-10-CM | POA: Diagnosis not present

## 2015-09-03 LAB — CBC WITH DIFFERENTIAL/PLATELET
BASOS PCT: 0 %
Basophils Absolute: 0 10*3/uL (ref 0–0.1)
EOS ABS: 0.1 10*3/uL (ref 0–0.7)
Eosinophils Relative: 3 %
HCT: 30.6 % — ABNORMAL LOW (ref 35.0–47.0)
HEMOGLOBIN: 10.5 g/dL — AB (ref 12.0–16.0)
Lymphocytes Relative: 37 %
Lymphs Abs: 1.6 10*3/uL (ref 1.0–3.6)
MCH: 28 pg (ref 26.0–34.0)
MCHC: 34.2 g/dL (ref 32.0–36.0)
MCV: 81.9 fL (ref 80.0–100.0)
MONOS PCT: 3 %
Monocytes Absolute: 0.1 10*3/uL — ABNORMAL LOW (ref 0.2–0.9)
NEUTROS PCT: 57 %
Neutro Abs: 2.4 10*3/uL (ref 1.4–6.5)
Platelets: 232 10*3/uL (ref 150–440)
RBC: 3.74 MIL/uL — AB (ref 3.80–5.20)
RDW: 14.6 % — ABNORMAL HIGH (ref 11.5–14.5)
WBC: 4.2 10*3/uL (ref 3.6–11.0)

## 2015-09-03 LAB — COMPREHENSIVE METABOLIC PANEL
ALBUMIN: 3.8 g/dL (ref 3.5–5.0)
ALK PHOS: 51 U/L (ref 38–126)
ALT: 11 U/L — ABNORMAL LOW (ref 14–54)
ANION GAP: 5 (ref 5–15)
AST: 21 U/L (ref 15–41)
BUN: <5 mg/dL — ABNORMAL LOW (ref 6–20)
CALCIUM: 9.2 mg/dL (ref 8.9–10.3)
CHLORIDE: 104 mmol/L (ref 101–111)
CO2: 27 mmol/L (ref 22–32)
Creatinine, Ser: 0.55 mg/dL (ref 0.44–1.00)
GFR calc Af Amer: >60 mL/min (ref >60)
GFR calc non Af Amer: >60 mL/min (ref >60)
GLUCOSE: 93 mg/dL (ref 65–99)
Potassium: 3.5 mmol/L (ref 3.5–5.1)
SODIUM: 136 mmol/L (ref 135–145)
Total Bilirubin: 0.5 mg/dL (ref 0.3–1.2)
Total Protein: 7.1 g/dL (ref 6.5–8.1)

## 2015-09-03 MED ORDER — INFLUENZA VAC SPLIT QUAD 0.5 ML IM SUSY
0.5000 mL | PREFILLED_SYRINGE | Freq: Once | INTRAMUSCULAR | Status: AC
  Administered 2015-09-03: 0.5 mL via INTRAMUSCULAR

## 2015-09-09 ENCOUNTER — Encounter: Payer: Self-pay | Admitting: Oncology

## 2015-09-09 NOTE — Progress Notes (Signed)
[place] @ [facility] [phone]  Fax:([phone]) [place] OB: August 04, 1971  MR#: [id number]  QMV#:[id number]  Patient Care Team: Lynnell Jude, MD as PCP - General (Family Medicine)  CHIEF COMPLAINT:  Chief Complaint  Patient presents with  . OTHER   Oncology History   1. Carcinoma of the colon, splenic  flexure.   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 2. K-RAS mutated. 3. Patient started on FOLFOX and Avastin 4 th June, 2015 4. Allergic reaction  characterized by generalized itching and redness  of face and palm on June 15, 2014 5. Started on FOLFIRI  and avastin   July 13, 2014 6. Finished 12 cycles of chemotherapy, 5-FU based FOLFOX was changed to FOLFIRI BECAUSE OF SENSITIVITY TO OXALIPLATIN(September 07, 2014) 7. Started on maintenance chemotherapy with 5-FU leucovorin and Avastin from September 28, 2014 8.  Progressive disease based on tumor markers as well as PET scan's so patient was started on FOLFOX chemotherapy with desensitizing  protocol.   9.  Progressing disease by CT scan and by symptoms.  Patient will be started on TAS-102 from October, 2016   Oncology Flowsheet 06/14/2015 06/14/2015 06/14/2015 06/14/2015 06/14/2015 07/05/2015 07/05/2015  Day, Cycle Day 1, Cycle 3         Day 1, Cycle 1    dexamethasone (DECADRON) IV [ 10 mg ]         [ 10 mg ]    fluorouracil (ADRUCIL) IV 750 mg 3,600 mg       400 mg/m2 2,000 mg/m2  fosaprepitant (EMEND) IV - - - - - - -  leucovorin IV 750 mg         20 mg/m2    methylPREDNISolone sodium succinate 125 mg/2 mL (SOLU-MEDROL) IV 40 mg 40 mg 40 mg 40 mg 40 mg - -  ondansetron (ZOFRAN) IV [ 8 mg ]         [ 8 mg ]    oxaliplatin (ELOXATIN) IV 165.75 mg 165.75 mg 165.75 mg 165.75 mg 165.75 mg - -  palonosetron (ALOXI) IV - - - - - - -    INTERVAL HISTORY: \  44 year old lady with recurrent carcinoma of colon  started on TAS-102. Patient is here for ongoing evaluation and treatment consideration.  Tolerating treatment very well.  Patient had increasing back pain which is completely resolved at present time.  No nausea no vomiting no rash noted diarrhea.  Marland Kitchen REVIEW OF SYSTEMS:   GENERAL:  Feels good.  Active.  No fevers, sweats or weight loss. PERFORMANCE STATUS (ECOG): 0 HEENT:  No visual changes, runny nose, sore throat, mouth sores or tenderness. Lungs: No shortness of breath or cough.  No hemoptysis. Cardiac:  No chest pain, palpitations, orthopnea, or PND. GI: Nausea following chemotherapy lasting for more than 24 hours which is now resolved.  No diarrhea. GU:  No urgency, frequency, dysuria, or hematuria. Musculoskeletal:  Back pain as described above Extremities:  No pain or swelling. Skin:  No rashes or skin changes. Neuro:  No headache, numbness or weakness, balance or coordination issues. Endocrine:  No diabetes, thyroid issues, hot flashes or night sweats. Psych:  No mood changes, depression or anxiety. Pain:  No focal pain. Review of systems:  All other systems reviewed and found to be negative.  As per HPI. Otherwise, a complete review of systems is  negatve.  PAST MEDICAL HISTORY: Past Medical History  Diagnosis Date  . Ulcer   . Anemia   . Obesity   . Constipation   . Migraines   . Cancer ([place]) 2015    colon  . Colon cancer metastasized to liver [facility]) April 2015    T3, N2, M1, stage IV with biopsy-proven liver metastases. On adjuvant chemotherapy    PAST SURGICAL HISTORY: Past Surgical History  Procedure Laterality Date  . Cholecystectomy  2014  . Tubal ligation  2005  . Colonoscopy  2015  . Upper gi endoscopy  2015    FAMILY HISTORY No significant family history of malignancy       ADVANCED DIRECTIVES:   Patient does have advanced healthcare directive HEALTH MAINTENANCE: Social History  Substance Use Topics  . Smoking status: Never Smoker   .  Smokeless tobacco: None  . Alcohol Use: No      Allergies  Allergen Reactions  . Fentanyl Swelling    Swelling of lips  . Oxaliplatin Itching  . Hydrocodone-Acetaminophen Hives    Current Outpatient Prescriptions  Medication Sig Dispense Refill  . acetaminophen (TYLENOL) 500 MG tablet Take 500 mg by mouth every 6 (six) hours as needed.    . diphenoxylate-atropine (LOMOTIL) 2.5-0.025 MG per tablet Take 1 tablet by mouth 4 (four) times daily as needed for diarrhea or loose stools (2 tabs to start, then one by mouth every 6 hours if needed for diarrhea.). 30 tablet 0  . ferrous fumarate-iron polysaccharide complex (TANDEM) 162-115.2 MG CAPS Take 1 capsule by mouth 2 (two) times daily.    Marland Kitchen gabapentin (NEURONTIN) 300 MG capsule Take 300 mg by mouth 2 (two) times daily.    Marland Kitchen HYDROcodone-acetaminophen (NORCO/VICODIN) 5-325 MG tablet TAKE 1 TABLET BY MOUTH EVERY 6 HOURS AS NEEDED FOR MODERATE PAIN  0  . ibuprofen (ADVIL,MOTRIN) 800 MG tablet Take 800 mg by mouth 3 (three) times daily.    Marland Kitchen lidocaine (LIDODERM) 5 % Place 1 patch onto the skin daily. Remove & Discard patch within 12 hours or as directed by MD    . methocarbamol (ROBAXIN) 750 MG tablet Take 750 mg by mouth 2 (two) times daily. 2 tabs    . ondansetron (ZOFRAN-ODT) 4 MG disintegrating tablet Take 1 tablet (4 mg total) by mouth 4 (four) times daily as needed for nausea or vomiting. 30 tablet 0  . pantoprazole (PROTONIX) 40 MG tablet Take 40 mg by mouth 2 (two) times daily.    . polyethylene glycol (MIRALAX / GLYCOLAX) packet Take 17 g by mouth daily.    . potassium chloride SA (K-DUR,KLOR-CON) 20 MEQ tablet Take 1 tablet (20 mEq total) by mouth daily. 30 tablet 3  . predniSONE (DELTASONE) 50 MG tablet     . promethazine (PHENERGAN) 25 MG suppository Place 1 suppository (25 mg total) rectally every 6 (six) hours as needed for nausea or vomiting. 6 each 0  . sulfamethoxazole-trimethoprim (BACTRIM DS,SEPTRA DS) 800-160 MG per tablet  Take 1 tablet by mouth 2 (two) times daily.    . traMADol (ULTRAM) 50 MG tablet Take 1 tablet (50 mg total) by mouth every 4 (four) hours. Take with Tyenol 325 mg tablet 90 tablet 1  . valACYclovir (VALTREX) 1000 MG tablet Take 1,000 mg by mouth 2 (two) times daily.    Marland Kitchen LONSURF 15-6.14 MG tablet     . LONSURF 20-8.19 MG tablet      No current facility-administered medications for this visit.   Facility-Administered Medications  Ordered in Other Visits  Medication Dose Route Frequency Provider Last Rate Last Dose  . sodium chloride 0.9 % injection 10 mL  10 mL Intracatheter PRN [name], MD   10 mL at 03/21/15 1025  . sodium chloride 0.9 % injection 10 mL  10 mL Intravenous PRN [name], MD   10 mL at 04/21/15 1025    OBJECTIVE:  Filed Vitals:   09/03/15 1553  BP: 119/66  Pulse: 77  Temp: 98.4 F (36.9 C)     Body mass index is 33.22 kg/(m^2).    ECOG FS:0 - Asymptomatic  PHYSICAL EXAM: GENERAL:  Well developed, well nourished, sitting comfortably in the exam room in no acute distress. MENTAL STATUS:  Alert and oriented to person, place and time. HEAD:  .  Normocephalic, atraumatic, face symmetric, no Cushingoid features. EYES:   Pupils equal round and reactive to light and accomodation.  No conjunctivitis or scleral icterus. ENT:  Oropharynx clear without lesion.  Tongue normal. Mucous membranes moist.  RESPIRATORY:  Clear to auscultation without rales, wheezes or rhonchi. CARDIOVASCULAR:  Regular rate and rhythm without murmur, rub or gallop. BREAST:  Right breast without masses, skin changes or nipple discharge.  Left breast without masses, skin changes or nipple discharge. ABDOMEN:  Soft, non-tender, with active bowel sounds, and no hepatosplenomegaly.  No masses. BACK:  No CVA tenderness.  No tenderness on percussion of the back or rib cage. SKIN:  No rashes, ulcers or lesions. EXTREMITIES: No edema, no skin discoloration or tenderness.  No palpable cords. LYMPH  NODES: No palpable cervical, supraclavicular, axillary or inguinal adenopathy  NEUROLOGICAL: Unremarkable. PSYCH:  Appropriate.   LAB RESULTS:  Appointment on 09/03/2015  Component Date Value Ref Range Status  . WBC 09/03/2015 4.2  3.6 - 11.0 K/uL Final  . RBC 09/03/2015 3.74* 3.80 - 5.20 MIL/uL Final  . Hemoglobin 09/03/2015 10.5* 12.0 - 16.0 g/dL Final  . HCT 09/03/2015 30.6* 35.0 - 47.0 % Final  . MCV 09/03/2015 81.9  80.0 - 100.0 fL Final  . MCH 09/03/2015 28.0  26.0 - 34.0 pg Final  . MCHC 09/03/2015 34.2  32.0 - 36.0 g/dL Final  . RDW 09/03/2015 14.6* 11.5 - 14.5 % Final  . Platelets 09/03/2015 232  150 - 440 K/uL Final  . Neutrophils Relative % 09/03/2015 57   Final  . Neutro Abs 09/03/2015 2.4  1.4 - 6.5 K/uL Final  . Lymphocytes Relative 09/03/2015 37   Final  . Lymphs Abs 09/03/2015 1.6  1.0 - 3.6 K/uL Final  . Monocytes Relative 09/03/2015 3   Final  . Monocytes Absolute 09/03/2015 0.1* 0.2 - 0.9 K/uL Final  . Eosinophils Relative 09/03/2015 3   Final  . Eosinophils Absolute 09/03/2015 0.1  0 - 0.7 K/uL Final  . Basophils Relative 09/03/2015 0   Final  . Basophils Absolute 09/03/2015 0.0  0 - 0.1 K/uL Final  . Sodium 09/03/2015 136  135 - 145 mmol/L Final  . Potassium 09/03/2015 3.5  3.5 - 5.1 mmol/L Final  . Chloride 09/03/2015 104  101 - 111 mmol/L Final  . CO2 09/03/2015 27  22 - 32 mmol/L Final  . Glucose, Bld 09/03/2015 93  65 - 99 mg/dL Final  . BUN 09/03/2015 <5* 6 - 20 mg/dL Final  . Creatinine, Ser 09/03/2015 0.55  0.44 - 1.00 mg/dL Final  . Calcium 09/03/2015 9.2  8.9 - 10.3 mg/dL Final  . Total Protein 09/03/2015 7.1  6.5 - 8.1 g/dL Final  .  Albumin 09/03/2015 3.8  3.5 - 5.0 g/dL Final  . AST 09/03/2015 21  15 - 41 U/L Final  . ALT 09/03/2015 11* 14 - 54 U/L Final  . Alkaline Phosphatase 09/03/2015 51  38 - 126 U/L Final  . Total Bilirubin 09/03/2015 0.5  0.3 - 1.2 mg/dL Final  . GFR calc non Af Amer 09/03/2015 >60  >60 mL/min Final  . GFR calc Af Amer  09/03/2015 >60  >60 mL/min Final   Comment: (NOTE) The eGFR has been calculated using the CKD EPI equation. This calculation has not been validated in all clinical situations. eGFR's persistently <60 mL/min signify possible Chronic Kidney Disease.   . Anion gap 09/03/2015 5  5 - 15 Final    No results found for: LABCA2 Lab Results  Component Value Date   CA199 <1 02/03/2014   Lab Results  Component Value Date   CEA 4.3 07/25/2015   Component     Latest Ref Rng 01/25/2015 03/08/2015 03/21/2015 04/18/2015 05/23/2015  CEA     0.0 - 4.7 ng/mL 4.8 (H) 6.0 (H) 5.0 (H) 4.2 4.7   Component     Latest Ref Rng 06/13/2015  CEA     0.0 - 4.7 ng/mL 4.4       Electronically Signed  By: Earle Gell M.D.  On: 07/25/2015 14:11 IMPRESSION: 1. Increased in soft tissue density mass centered about the inferior aspect of the pancreatic body. Presumably secondary splenic vein occlusion. Given the clinical history of splenic flexure colonic primary, and the appearance on the prior exam, this could represent a progressive nodal metastasis. An exophytic pancreatic carcinoma or pancreatic parenchymal metastasis could look similar. Endoscopic ultrasound with tissue sampling may be informative. 2. Developing adenopathy within the adjacent small bowel mesentery is suspicious for metastatic disease. 3. Possible constipation. These results will be called to the ordering clinician or representative by the Radiologist Assistant, and communication documented in the PACS or zVision Dashboard.   Electronically Signed  By: Abigail Miyamoto M.D.  On: 07/27/2015 10:33 ASSESSMENT: Stage IV carcinoma of colon CT scan has been reviewed independently shows progressive soft tissue density around peripancreatic area We will discuss this case in possibility of biopsy through EUS can be considered. Meanwhile we will stop chemotherapy and change treatment to  TAS-102. 35 mg/m 5 days a week for 2 weeks and  2 weeks off Various side effects of this treatment has been discussed with the patient and family 2.  Patient is tolerating TAS-102 without any significant side effect. Significant improvement in back pain Will continue present therapy to reevaluate with another CEA in 4 weeks    Continue  chemotherapy Colon cancer   Staging form: Colon and Rectum, AJCC 7th Edition     Clinical: Stage IVB (yT3, N1, M1b) - Signed by [name], MD on 03/07/2015   [name], MD   09/09/2015 11:37 AM

## 2015-10-01 ENCOUNTER — Inpatient Hospital Stay (HOSPITAL_BASED_OUTPATIENT_CLINIC_OR_DEPARTMENT_OTHER): Payer: 59 | Admitting: Oncology

## 2015-10-01 ENCOUNTER — Inpatient Hospital Stay: Payer: 59 | Attending: Oncology

## 2015-10-01 ENCOUNTER — Telehealth: Payer: Self-pay | Admitting: *Deleted

## 2015-10-01 VITALS — BP 121/76 | HR 84 | Temp 98.6°F | Wt 181.7 lb

## 2015-10-01 DIAGNOSIS — R51 Headache: Secondary | ICD-10-CM | POA: Insufficient documentation

## 2015-10-01 DIAGNOSIS — C185 Malignant neoplasm of splenic flexure: Secondary | ICD-10-CM | POA: Diagnosis not present

## 2015-10-01 DIAGNOSIS — M549 Dorsalgia, unspecified: Secondary | ICD-10-CM | POA: Insufficient documentation

## 2015-10-01 DIAGNOSIS — K869 Disease of pancreas, unspecified: Secondary | ICD-10-CM | POA: Insufficient documentation

## 2015-10-01 DIAGNOSIS — H539 Unspecified visual disturbance: Secondary | ICD-10-CM

## 2015-10-01 DIAGNOSIS — R590 Localized enlarged lymph nodes: Secondary | ICD-10-CM | POA: Insufficient documentation

## 2015-10-01 DIAGNOSIS — Z888 Allergy status to other drugs, medicaments and biological substances status: Secondary | ICD-10-CM | POA: Diagnosis not present

## 2015-10-01 DIAGNOSIS — Z85038 Personal history of other malignant neoplasm of large intestine: Secondary | ICD-10-CM | POA: Diagnosis not present

## 2015-10-01 DIAGNOSIS — C787 Secondary malignant neoplasm of liver and intrahepatic bile duct: Secondary | ICD-10-CM

## 2015-10-01 DIAGNOSIS — Z79899 Other long term (current) drug therapy: Secondary | ICD-10-CM

## 2015-10-01 DIAGNOSIS — Z7952 Long term (current) use of systemic steroids: Secondary | ICD-10-CM | POA: Diagnosis not present

## 2015-10-01 DIAGNOSIS — C189 Malignant neoplasm of colon, unspecified: Secondary | ICD-10-CM

## 2015-10-01 DIAGNOSIS — Z9221 Personal history of antineoplastic chemotherapy: Secondary | ICD-10-CM

## 2015-10-01 LAB — COMPREHENSIVE METABOLIC PANEL
ALK PHOS: 48 U/L (ref 38–126)
ALT: 11 U/L — ABNORMAL LOW (ref 14–54)
ANION GAP: 6 (ref 5–15)
AST: 20 U/L (ref 15–41)
Albumin: 4 g/dL (ref 3.5–5.0)
BILIRUBIN TOTAL: 0.8 mg/dL (ref 0.3–1.2)
CALCIUM: 9.5 mg/dL (ref 8.9–10.3)
CO2: 26 mmol/L (ref 22–32)
Chloride: 103 mmol/L (ref 101–111)
Creatinine, Ser: 0.52 mg/dL (ref 0.44–1.00)
GFR calc Af Amer: >60 mL/min (ref >60)
GFR calc non Af Amer: >60 mL/min (ref >60)
GLUCOSE: 98 mg/dL (ref 65–99)
Potassium: 3.3 mmol/L — ABNORMAL LOW (ref 3.5–5.1)
Sodium: 135 mmol/L (ref 135–145)
TOTAL PROTEIN: 7.3 g/dL (ref 6.5–8.1)

## 2015-10-01 LAB — CBC WITH DIFFERENTIAL/PLATELET
BASOS PCT: 1 %
Basophils Absolute: 0 10*3/uL (ref 0–0.1)
Eosinophils Absolute: 0 10*3/uL (ref 0–0.7)
Eosinophils Relative: 1 %
HEMATOCRIT: 30.2 % — AB (ref 35.0–47.0)
Hemoglobin: 10.2 g/dL — ABNORMAL LOW (ref 12.0–16.0)
LYMPHS ABS: 1.3 10*3/uL (ref 1.0–3.6)
LYMPHS PCT: 44 %
MCH: 27.1 pg (ref 26.0–34.0)
MCHC: 33.7 g/dL (ref 32.0–36.0)
MCV: 80.3 fL (ref 80.0–100.0)
MONO ABS: 0.1 10*3/uL — AB (ref 0.2–0.9)
MONOS PCT: 3 %
NEUTROS ABS: 1.6 10*3/uL (ref 1.4–6.5)
Neutrophils Relative %: 51 %
Platelets: 138 10*3/uL — ABNORMAL LOW (ref 150–440)
RBC: 3.76 MIL/uL — ABNORMAL LOW (ref 3.80–5.20)
RDW: 16.5 % — AB (ref 11.5–14.5)
WBC: 3 10*3/uL — ABNORMAL LOW (ref 3.6–11.0)

## 2015-10-01 MED ORDER — POTASSIUM CHLORIDE 20 MEQ PO PACK: 20.0000 meq | PACK | Freq: Every day | ORAL | Status: DC

## 2015-10-01 NOTE — Progress Notes (Signed)
Patient here today as acute add on for vision changes.  States she was at work this morning when she noticed things did not look right.  She also had an episode of n/v on Thanksgiving Day.  Also states she has back pain when she is not on her [place] but the week she starts chemo it resolves.

## 2015-10-01 NOTE — Telephone Encounter (Signed)
Called to report that she is having visual problems she feels as if she is looking through a kaleidoscope. States hse has an appt this afternoon, asking if that needs to be moved up. Per Dr Oliva Bustard, she is to come in now. Unable to reach pt, I spoke with her emergency contact [place] who states her friend is on her way to [place] where she works to get her. I advised her that Dr Oliva Bustard wants her to come in now

## 2015-10-02 ENCOUNTER — Encounter: Payer: Self-pay | Admitting: Oncology

## 2015-10-02 LAB — CEA: CEA: 6.8 ng/mL — ABNORMAL HIGH (ref 0.0–4.7)

## 2015-10-02 NOTE — Progress Notes (Signed)
[place] @ [facility] [phone]  Fax:([phone]) [place]: [birth year]  MR#: [id number]  XNT#:[id number]  Patient Care Team: Lynnell Jude, MD as PCP - General (Family Medicine)  CHIEF COMPLAINT:  Chief Complaint  Patient presents with  . Colon Cancer   Oncology History   1. Carcinoma of the colon, splenic  flexure.   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 2. K-RAS mutated. 3. Patient started on FOLFOX and Avastin 4 th June, 2015 4. Allergic reaction  characterized by generalized itching and redness  of face and palm on June 15, 2014 5. Started on FOLFIRI  and avastin   July 13, 2014 6. Finished 12 cycles of chemotherapy, 5-FU based FOLFOX was changed to FOLFIRI BECAUSE OF SENSITIVITY TO OXALIPLATIN(September 07, 2014) 7. Started on maintenance chemotherapy with 5-FU leucovorin and Avastin from September 28, 2014 8.  Progressive disease based on tumor markers as well as PET scan's so patient was started on FOLFOX chemotherapy with desensitizing  protocol.   9.  Progressing disease by CT scan and by symptoms.  Patient will be started on TAS-102 from October, 2016   Oncology Flowsheet 06/14/2015 06/14/2015 06/14/2015 06/14/2015 06/14/2015 07/05/2015 07/05/2015  Day, Cycle Day 1, Cycle 3         Day 1, Cycle 1    dexamethasone (DECADRON) IV [ 10 mg ]         [ 10 mg ]    fluorouracil (ADRUCIL) IV 750 mg 3,600 mg       400 mg/m2 2,000 mg/m2  fosaprepitant (EMEND) IV - - - - - - -  leucovorin IV 750 mg         20 mg/m2    methylPREDNISolone sodium succinate 125 mg/2 mL (SOLU-MEDROL) IV 40 mg 40 mg 40 mg 40 mg 40 mg - -  ondansetron (ZOFRAN) IV [ 8 mg ]         [ 8 mg ]    oxaliplatin (ELOXATIN) IV 165.75 mg 165.75 mg 165.75 mg 165.75 mg 165.75 mg - -  palonosetron (ALOXI) IV - - - - - - -    INTERVAL HISTORY: \  44 year old lady with recurrent carcinoma of  colon started on TAS-102. Patient is here for ongoing evaluation and treatment consideration.  Tolerating treatment very well.  Patient had increasing back pain which is completely resolved at present time.  No nausea no vomiting no rash noted diarrhea. This and came is an acute add-on this morning when working on computer Patient reason got blurred.  According to her both eyes patient started seeing fuzzy things.  Lasted for a while.  Has a different color and things were fuzzy  The patient was evaluated by this time reason abnormality is resolved. Patient has finished second cycle of  tas-102 . Marland Kitchen REVIEW OF SYSTEMS:   GENERAL:  Feels good.  Active.  No fevers, sweats or weight loss. PERFORMANCE STATUS (ECOG): 0 HEENT:  No visual changes, runny nose, sore throat, mouth sores or tenderness.  Mild headache Visual disturbances as described about Lungs: No shortness of breath or cough.  No hemoptysis. Cardiac:  No chest pain, palpitations, orthopnea, or PND. GI: Nausea following chemotherapy lasting for more than 24 hours which is now resolved.  No diarrhea. GU:  No urgency, frequency, dysuria, or hematuria. Musculoskeletal:  Back pain as described above Extremities:  No pain or swelling. Skin:  No rashes or skin changes. Neuro:  No headache, numbness or weakness, balance or coordination issues. Endocrine:  No diabetes, thyroid issues, hot flashes or night sweats. Psych:  No mood changes, depression or anxiety. Pain:  No focal pain. Review of systems:  All other systems reviewed and found to be negative.  As per HPI. Otherwise, a complete review of systems is negatve.  PAST MEDICAL HISTORY: Past Medical History  Diagnosis Date  . Ulcer   . Anemia   . Obesity   . Constipation   . Migraines   . Cancer ([place]) 2015    colon  . Colon cancer metastasized to liver [facility]) April 2015    T3, N2, M1, stage IV with biopsy-proven liver metastases. On adjuvant chemotherapy    PAST SURGICAL  HISTORY: Past Surgical History  Procedure Laterality Date  . Cholecystectomy  2014  . Tubal ligation  2005  . Colonoscopy  2015  . Upper gi endoscopy  2015    FAMILY HISTORY No significant family history of malignancy       ADVANCED DIRECTIVES:   Patient does have advanced healthcare directive HEALTH MAINTENANCE: Social History  Substance Use Topics  . Smoking status: Never Smoker   . Smokeless tobacco: None  . Alcohol Use: No      Allergies  Allergen Reactions  . Fentanyl Swelling    Swelling of lips  . Oxaliplatin Itching  . Hydrocodone-Acetaminophen Hives    Current Outpatient Prescriptions  Medication Sig Dispense Refill  . acetaminophen (TYLENOL) 500 MG tablet Take 500 mg by mouth every 6 (six) hours as needed.    . diphenoxylate-atropine (LOMOTIL) 2.5-0.025 MG per tablet Take 1 tablet by mouth 4 (four) times daily as needed for diarrhea or loose stools (2 tabs to start, then one by mouth every 6 hours if needed for diarrhea.). 30 tablet 0  . ferrous fumarate-iron polysaccharide complex (TANDEM) 162-115.2 MG CAPS Take 1 capsule by mouth 2 (two) times daily.    Marland Kitchen gabapentin (NEURONTIN) 300 MG capsule Take 300 mg by mouth 2 (two) times daily.    Marland Kitchen HYDROcodone-acetaminophen (NORCO/VICODIN) 5-325 MG tablet TAKE 1 TABLET BY MOUTH EVERY 6 HOURS AS NEEDED FOR MODERATE PAIN  0  . ibuprofen (ADVIL,MOTRIN) 800 MG tablet Take 800 mg by mouth 3 (three) times daily.    Marland Kitchen lidocaine (LIDODERM) 5 % Place 1 patch onto the skin daily. Remove & Discard patch within 12 hours or as directed by MD    . LONSURF 15-6.14 MG tablet     . LONSURF 20-8.19 MG tablet     . methocarbamol (ROBAXIN) 750 MG tablet Take 750 mg by mouth 2 (two) times daily. 2 tabs    . ondansetron (ZOFRAN-ODT) 4 MG disintegrating tablet Take 1 tablet (4 mg total) by mouth 4 (four) times daily as needed for nausea or vomiting. 30 tablet 0  . pantoprazole (PROTONIX) 40 MG tablet Take 40 mg by mouth 2 (two) times  daily.    . polyethylene glycol (MIRALAX / GLYCOLAX) packet Take 17 g by mouth daily.    . potassium chloride SA (K-DUR,KLOR-CON) 20 MEQ tablet Take 1 tablet (20 mEq total) by mouth daily. 30 tablet 3  . predniSONE (DELTASONE) 50 MG tablet     . promethazine (PHENERGAN) 25 MG suppository Place 1 suppository (25 mg total) rectally every 6 (six) hours as needed for nausea or vomiting. 6 each 0  . sulfamethoxazole-trimethoprim (BACTRIM DS,SEPTRA DS) 800-160 MG per tablet  Take 1 tablet by mouth 2 (two) times daily.    . traMADol (ULTRAM) 50 MG tablet Take 1 tablet (50 mg total) by mouth every 4 (four) hours. Take with Tyenol 325 mg tablet 90 tablet 1  . valACYclovir (VALTREX) 1000 MG tablet Take 1,000 mg by mouth 2 (two) times daily.    . potassium chloride (KLOR-CON) 20 MEQ packet Take 20 mEq by mouth daily. 30 packet 3   No current facility-administered medications for this visit.   Facility-Administered Medications Ordered in Other Visits  Medication Dose Route Frequency Provider Last Rate Last Dose  . sodium chloride 0.9 % injection 10 mL  10 mL Intracatheter PRN [name], MD   10 mL at 03/21/15 1025  . sodium chloride 0.9 % injection 10 mL  10 mL Intravenous PRN [name], MD   10 mL at 04/21/15 1025    OBJECTIVE:  Filed Vitals:   10/01/15 1200  BP: 121/76  Pulse: 84  Temp: 98.6 F (37 C)     Body mass index is 32.19 kg/(m^2).    ECOG FS:0 - Asymptomatic  PHYSICAL EXAM: GENERAL:  Well developed, well nourished, sitting comfortably in the exam room in no acute distress. MENTAL STATUS:  Alert and oriented to person, place and time. HEAD:  .  Normocephalic, atraumatic, face symmetric, no Cushingoid features. EYES:   Pupils equal round and reactive to light and accomodation.  No conjunctivitis or scleral icterus. ENT:  Oropharynx clear without lesion.  Tongue normal. Mucous membranes moist.  RESPIRATORY:  Clear to auscultation without rales, wheezes or rhonchi. CARDIOVASCULAR:   Regular rate and rhythm without murmur, rub or gallop. BREAST:  Right breast without masses, skin changes or nipple discharge.  Left breast without masses, skin changes or nipple discharge. ABDOMEN:  Soft, non-tender, with active bowel sounds, and no hepatosplenomegaly.  No masses. BACK:  No CVA tenderness.  No tenderness on percussion of the back or rib cage. SKIN:  No rashes, ulcers or lesions. EXTREMITIES: No edema, no skin discoloration or tenderness.  No palpable cords. LYMPH NODES: No palpable cervical, supraclavicular, axillary or inguinal adenopathy  NEUROLOGICAL: Unremarkable. PSYCH:  Appropriate.   LAB RESULTS:  Appointment on 10/01/2015  Component Date Value Ref Range Status  . WBC 10/01/2015 3.0* 3.6 - 11.0 K/uL Final  . RBC 10/01/2015 3.76* 3.80 - 5.20 MIL/uL Final  . Hemoglobin 10/01/2015 10.2* 12.0 - 16.0 g/dL Final  . HCT 10/01/2015 30.2* 35.0 - 47.0 % Final  . MCV 10/01/2015 80.3  80.0 - 100.0 fL Final  . MCH 10/01/2015 27.1  26.0 - 34.0 pg Final  . MCHC 10/01/2015 33.7  32.0 - 36.0 g/dL Final  . RDW 10/01/2015 16.5* 11.5 - 14.5 % Final  . Platelets 10/01/2015 138* 150 - 440 K/uL Final  . Neutrophils Relative % 10/01/2015 51   Final  . Neutro Abs 10/01/2015 1.6  1.4 - 6.5 K/uL Final  . Lymphocytes Relative 10/01/2015 44   Final  . Lymphs Abs 10/01/2015 1.3  1.0 - 3.6 K/uL Final  . Monocytes Relative 10/01/2015 3   Final  . Monocytes Absolute 10/01/2015 0.1* 0.2 - 0.9 K/uL Final  . Eosinophils Relative 10/01/2015 1   Final  . Eosinophils Absolute 10/01/2015 0.0  0 - 0.7 K/uL Final  . Basophils Relative 10/01/2015 1   Final  . Basophils Absolute 10/01/2015 0.0  0 - 0.1 K/uL Final  . Sodium 10/01/2015 135  135 - 145 mmol/L Final  . Potassium 10/01/2015 3.3* 3.5 -  5.1 mmol/L Final  . Chloride 10/01/2015 103  101 - 111 mmol/L Final  . CO2 10/01/2015 26  22 - 32 mmol/L Final  . Glucose, Bld 10/01/2015 98  65 - 99 mg/dL Final  . BUN 10/01/2015 <5* 6 - 20 mg/dL Final    . Creatinine, Ser 10/01/2015 0.52  0.44 - 1.00 mg/dL Final  . Calcium 10/01/2015 9.5  8.9 - 10.3 mg/dL Final  . Total Protein 10/01/2015 7.3  6.5 - 8.1 g/dL Final  . Albumin 10/01/2015 4.0  3.5 - 5.0 g/dL Final  . AST 10/01/2015 20  15 - 41 U/L Final  . ALT 10/01/2015 11* 14 - 54 U/L Final  . Alkaline Phosphatase 10/01/2015 48  38 - 126 U/L Final  . Total Bilirubin 10/01/2015 0.8  0.3 - 1.2 mg/dL Final  . GFR calc non Af Amer 10/01/2015 >60  >60 mL/min Final  . GFR calc Af Amer 10/01/2015 >60  >60 mL/min Final   Comment: (NOTE) The eGFR has been calculated using the CKD EPI equation. This calculation has not been validated in all clinical situations. eGFR's persistently <60 mL/min signify possible Chronic Kidney Disease.   . Anion gap 10/01/2015 6  5 - 15 Final  . CEA 10/01/2015 6.8* 0.0 - 4.7 ng/mL Final   Comment: (NOTE)       Roche ECLIA methodology       Nonsmokers  <3.9                                     Smokers     <5.6 Performed At: [facility] [place], [place] [id number] Lindon Romp MD XL:[id number]     No results found for: LABCA2 Lab Results  Component Value Date   CA199 <1 02/03/2014   Lab Results  Component Value Date   CEA 6.8* 10/01/2015   Component     Latest Ref Rng 01/25/2015 03/08/2015 03/21/2015 04/18/2015 05/23/2015  CEA     0.0 - 4.7 ng/mL 4.8 (H) 6.0 (H) 5.0 (H) 4.2 4.7   Component     Latest Ref Rng 06/13/2015  CEA     0.0 - 4.7 ng/mL 4.4       Electronically Signed  By: Earle Gell M.D.  On: 07/25/2015 14:11 IMPRESSION: 1. Increased in soft tissue density mass centered about the inferior aspect of the pancreatic body. Presumably secondary splenic vein occlusion. Given the clinical history of splenic flexure colonic primary, and the appearance on the prior exam, this could represent a progressive nodal metastasis. An exophytic pancreatic carcinoma or pancreatic parenchymal metastasis could look similar.  Endoscopic ultrasound with tissue sampling may be informative. 2. Developing adenopathy within the adjacent small bowel mesentery is suspicious for metastatic disease. 3. Possible constipation. These results will be called to the ordering clinician or representative by the Radiologist Assistant, and communication documented in the PACS or zVision Dashboard.   Electronically Signed  By: Abigail Miyamoto M.D.  On: 07/27/2015 10:33 ASSESSMENT: Stage IV carcinoma of colon CT scan has been reviewed independently shows progressive soft tissue density around peripancreatic area We will discuss this case in possibility of biopsy through EUS can be considered. Meanwhile we will stop chemotherapy and change treatment to  TAS-102. 35 mg/m 5 days a week for 2 weeks and 2 weeks off Various side effects of this treatment has been discussed with the patient and family 2.  Patient is  tolerating TAS-102 without any significant side effect. Significant improvement in back pain Will continue present therapy to reevaluate with another CEA in 4 weeks  October 01, 2015  Abnormal visual disturbance with headache.  Most likely reaction to medication MRI scan of head is needed to rule out any metastases to the brain Ophthalmological evaluation if symptoms does not subside or weaker Consideration of stopping medication if symptoms continues or recurs Discussed that with the patient. Continue  chemotherapy Colon cancer   Staging form: Colon and Rectum, AJCC 7th Edition     Clinical: Stage IVB (yT3, N1, M1b) - Signed by [name], MD on 03/07/2015   [name], MD   10/02/2015 8:17 AM

## 2015-10-05 ENCOUNTER — Ambulatory Visit
Admission: RE | Admit: 2015-10-05 | Discharge: 2015-10-05 | Disposition: A | Discharge status: Home / Self Care | Payer: 59 | Source: Ambulatory Visit | Attending: Oncology | Admitting: Oncology

## 2015-10-05 DIAGNOSIS — Z85038 Personal history of other malignant neoplasm of large intestine: Secondary | ICD-10-CM | POA: Insufficient documentation

## 2015-10-05 DIAGNOSIS — C8590 Non-Hodgkin lymphoma, unspecified, unspecified site: Secondary | ICD-10-CM | POA: Insufficient documentation

## 2015-10-05 DIAGNOSIS — H539 Unspecified visual disturbance: Secondary | ICD-10-CM | POA: Insufficient documentation

## 2015-10-05 DIAGNOSIS — C189 Malignant neoplasm of colon, unspecified: Secondary | ICD-10-CM

## 2015-10-05 MED ORDER — GADOBENATE DIMEGLUMINE 529 MG/ML IV SOLN
20.0000 mL | Freq: Once | INTRAVENOUS | Status: AC | PRN
  Administered 2015-10-05: 17 mL via INTRAVENOUS

## 2015-10-15 ENCOUNTER — Encounter: Payer: Self-pay | Admitting: Oncology

## 2015-10-15 ENCOUNTER — Inpatient Hospital Stay: Payer: 59

## 2015-10-15 ENCOUNTER — Inpatient Hospital Stay (HOSPITAL_BASED_OUTPATIENT_CLINIC_OR_DEPARTMENT_OTHER): Payer: 59 | Admitting: Oncology

## 2015-10-15 VITALS — BP 122/82 | HR 69 | Temp 98.3°F | Resp 18 | Wt 178.6 lb

## 2015-10-15 DIAGNOSIS — Z79899 Other long term (current) drug therapy: Secondary | ICD-10-CM

## 2015-10-15 DIAGNOSIS — Z888 Allergy status to other drugs, medicaments and biological substances status: Secondary | ICD-10-CM

## 2015-10-15 DIAGNOSIS — H539 Unspecified visual disturbance: Secondary | ICD-10-CM

## 2015-10-15 DIAGNOSIS — Z9221 Personal history of antineoplastic chemotherapy: Secondary | ICD-10-CM | POA: Diagnosis not present

## 2015-10-15 DIAGNOSIS — C787 Secondary malignant neoplasm of liver and intrahepatic bile duct: Secondary | ICD-10-CM

## 2015-10-15 DIAGNOSIS — C189 Malignant neoplasm of colon, unspecified: Secondary | ICD-10-CM

## 2015-10-15 DIAGNOSIS — K869 Disease of pancreas, unspecified: Secondary | ICD-10-CM

## 2015-10-15 DIAGNOSIS — R51 Headache: Secondary | ICD-10-CM

## 2015-10-15 DIAGNOSIS — Z7952 Long term (current) use of systemic steroids: Secondary | ICD-10-CM

## 2015-10-15 DIAGNOSIS — Z85038 Personal history of other malignant neoplasm of large intestine: Secondary | ICD-10-CM

## 2015-10-15 DIAGNOSIS — M549 Dorsalgia, unspecified: Secondary | ICD-10-CM

## 2015-10-15 DIAGNOSIS — C185 Malignant neoplasm of splenic flexure: Secondary | ICD-10-CM | POA: Diagnosis not present

## 2015-10-15 DIAGNOSIS — R590 Localized enlarged lymph nodes: Secondary | ICD-10-CM

## 2015-10-15 LAB — COMPREHENSIVE METABOLIC PANEL
ALK PHOS: 57 U/L (ref 38–126)
ALT: 18 U/L (ref 14–54)
AST: 25 U/L (ref 15–41)
Albumin: 3.8 g/dL (ref 3.5–5.0)
Anion gap: 6 (ref 5–15)
BUN: <5 mg/dL — ABNORMAL LOW (ref 6–20)
CALCIUM: 9.1 mg/dL (ref 8.9–10.3)
CO2: 26 mmol/L (ref 22–32)
CREATININE: 0.72 mg/dL (ref 0.44–1.00)
Chloride: 105 mmol/L (ref 101–111)
GFR calc non Af Amer: >60 mL/min (ref >60)
GLUCOSE: 97 mg/dL (ref 65–99)
Potassium: 3 mmol/L — ABNORMAL LOW (ref 3.5–5.1)
SODIUM: 137 mmol/L (ref 135–145)
Total Bilirubin: 0.5 mg/dL (ref 0.3–1.2)
Total Protein: 7.1 g/dL (ref 6.5–8.1)

## 2015-10-15 LAB — CBC WITH DIFFERENTIAL/PLATELET
Basophils Absolute: 0 10*3/uL (ref 0–0.1)
Basophils Relative: 1 %
EOS ABS: 0 10*3/uL (ref 0–0.7)
Eosinophils Relative: 0 %
HEMATOCRIT: 30.9 % — AB (ref 35.0–47.0)
HEMOGLOBIN: 10.3 g/dL — AB (ref 12.0–16.0)
LYMPHS ABS: 1.4 10*3/uL (ref 1.0–3.6)
LYMPHS PCT: 42 %
MCH: 27.4 pg (ref 26.0–34.0)
MCHC: 33.2 g/dL (ref 32.0–36.0)
MCV: 82.4 fL (ref 80.0–100.0)
Monocytes Absolute: 0.5 10*3/uL (ref 0.2–0.9)
Monocytes Relative: 17 %
NEUTROS ABS: 1.3 10*3/uL — AB (ref 1.4–6.5)
NEUTROS PCT: 40 %
Platelets: 261 10*3/uL (ref 150–440)
RBC: 3.75 MIL/uL — AB (ref 3.80–5.20)
RDW: 20.8 % — ABNORMAL HIGH (ref 11.5–14.5)
WBC: 3.2 10*3/uL — AB (ref 3.6–11.0)

## 2015-10-15 NOTE — Progress Notes (Signed)
[place] @ [facility] [phone]  Fax:([phone] [place]: 1971-10-12  MR#: [id number]  PFX#:[id number]  Patient Care Team: Lynnell Jude, MD as PCP - General (Family Medicine)  CHIEF COMPLAINT:  Chief Complaint  Patient presents with  . Colon Cancer   Oncology History   1. Carcinoma of the colon, splenic  flexure.   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 2. K-RAS mutated. 3. Patient started on FOLFOX and Avastin 4 th June, 2015 4. Allergic reaction  characterized by generalized itching and redness  of face and palm on June 15, 2014 5. Started on FOLFIRI  and avastin   July 13, 2014 6. Finished 12 cycles of chemotherapy, 5-FU based FOLFOX was changed to FOLFIRI BECAUSE OF SENSITIVITY TO OXALIPLATIN(September 07, 2014) 7. Started on maintenance chemotherapy with 5-FU leucovorin and Avastin from September 28, 2014 8.  Progressive disease based on tumor markers as well as PET scan's so patient was started on FOLFOX chemotherapy with desensitizing  protocol.   9.  Progressing disease by CT scan and by symptoms.  Patient will be started on TAS-102 from October, 2016   Oncology Flowsheet 06/14/2015 06/14/2015 06/14/2015 06/14/2015 06/14/2015 07/05/2015 07/05/2015  Day, Cycle Day 1, Cycle 3         Day 1, Cycle 1    dexamethasone (DECADRON) IV [ 10 mg ]         [ 10 mg ]    fluorouracil (ADRUCIL) IV 750 mg 3,600 mg       400 mg/m2 2,000 mg/m2  fosaprepitant (EMEND) IV - - - - - - -  leucovorin IV 750 mg         20 mg/m2    methylPREDNISolone sodium succinate 125 mg/2 mL (SOLU-MEDROL) IV 40 mg 40 mg 40 mg 40 mg 40 mg - -  ondansetron (ZOFRAN) IV [ 8 mg ]         [ 8 mg ]    oxaliplatin (ELOXATIN) IV 165.75 mg 165.75 mg 165.75 mg 165.75 mg 165.75 mg - -  palonosetron (ALOXI) IV - - - - - - -    INTERVAL HISTORY: \  44 year old lady with recurrent carcinoma of  colon started on TAS-102. Patient is here for ongoing evaluation and treatment consideration.  Tolerating treatment very well.  Patient had increasing back pain which is completely resolved at present time.  No nausea no vomiting no rash noted diarrhea. This and came is an acute add-on this morning when working on computer Patient reason got blurred.  According to her both eyes patient started seeing fuzzy things.  Lasted for a while.  Has a different color and things were fuzzy  The patient was evaluated by this time reason abnormality is resolved. Patient has finished second cycle of  tas-102 . Patient had MRI scan of brain which has been reviewed independently.  There is no evidence of metastases to the brain Visual disturbances has completely resolved Continues to have back pain Continues to lose weight taking small frequent   Meals  Recently fell and had a hip injury was evaluated at [facility] emergency room took Motrin.  Pain is better. REVIEW OF SYSTEMS:   GENERAL:  Feels good.  Active.  No fevers, sweats or weight loss. PERFORMANCE STATUS (ECOG): 0 HEENT:  No visual changes, runny nose, sore throat, mouth sores or tenderness.  Mild headache Visual disturbances as described about Lungs: No shortness of breath or cough.  No hemoptysis. Cardiac:  No chest pain, palpitations, orthopnea, or PND. GI: Nausea following chemotherapy lasting for more than 24 hours which is now resolved.  No diarrhea. GU:  No urgency, frequency, dysuria, or hematuria. Musculoskeletal:  Back pain as described above Extremities:  No pain or swelling.  Pain in the left hip after fall.took motrn Skin:  No rashes or skin changes. Neuro:  No headache, numbness or weakness, balance or coordination issues. Endocrine:  No diabetes, thyroid issues, hot flashes or night sweats. Psych:  No mood changes, depression or anxiety. Pain:  No focal pain. Review of systems:  All other systems reviewed and found to be negative.  As  per HPI. Otherwise, a complete review of systems is negatve.  PAST MEDICAL HISTORY: Past Medical History  Diagnosis Date  . Ulcer   . Anemia   . Obesity   . Constipation   . Migraines   . Cancer ([place]) 2015    colon  . Colon cancer metastasized to liver [facility]) April 2015    T3, N2, M1, stage IV with biopsy-proven liver metastases. On adjuvant chemotherapy    PAST SURGICAL HISTORY: Past Surgical History  Procedure Laterality Date  . Cholecystectomy  2014  . Tubal ligation  2005  . Colonoscopy  2015  . Upper gi endoscopy  2015    FAMILY HISTORY No significant family history of malignancy       ADVANCED DIRECTIVES:   Patient does have advanced healthcare directive HEALTH MAINTENANCE: Social History  Substance Use Topics  . Smoking status: Never Smoker   . Smokeless tobacco: None  . Alcohol Use: No      Allergies  Allergen Reactions  . Fentanyl Swelling    Swelling of lips  . Oxaliplatin Itching  . Hydrocodone-Acetaminophen Hives    Current Outpatient Prescriptions  Medication Sig Dispense Refill  . acetaminophen (TYLENOL) 500 MG tablet Take 500 mg by mouth every 6 (six) hours as needed.    . diphenoxylate-atropine (LOMOTIL) 2.5-0.025 MG per tablet Take 1 tablet by mouth 4 (four) times daily as needed for diarrhea or loose stools (2 tabs to start, then one by mouth every 6 hours if needed for diarrhea.). 30 tablet 0  . ferrous fumarate-iron polysaccharide complex (TANDEM) 162-115.2 MG CAPS Take 1 capsule by mouth 2 (two) times daily.    Marland Kitchen gabapentin (NEURONTIN) 300 MG capsule Take 300 mg by mouth 2 (two) times daily.    Marland Kitchen HYDROcodone-acetaminophen (NORCO/VICODIN) 5-325 MG tablet TAKE 1 TABLET BY MOUTH EVERY 6 HOURS AS NEEDED FOR MODERATE PAIN  0  . ibuprofen (ADVIL,MOTRIN) 800 MG tablet Take 800 mg by mouth 3 (three) times daily.    Marland Kitchen ibuprofen (ADVIL,MOTRIN) 800 MG tablet Take 800 mg by mouth.    . lidocaine (LIDODERM) 5 % Place 1 patch onto the skin daily.  Remove & Discard patch within 12 hours or as directed by MD    . LONSURF 15-6.14 MG tablet     . LONSURF 20-8.19 MG tablet     . methocarbamol (ROBAXIN) 750 MG tablet Take 750 mg by mouth 2 (two) times daily. 2 tabs    . pantoprazole (PROTONIX) 40 MG tablet Take 40 mg by mouth 2 (two) times daily.    . polyethylene glycol (MIRALAX / GLYCOLAX) packet Take 17 g by mouth daily.    . potassium chloride (KLOR-CON) 20 MEQ packet Take 20 mEq by mouth daily.  30 packet 3  . potassium chloride SA (K-DUR,KLOR-CON) 20 MEQ tablet Take 1 tablet (20 mEq total) by mouth daily. 30 tablet 3  . predniSONE (DELTASONE) 50 MG tablet     . promethazine (PHENERGAN) 25 MG suppository Place 1 suppository (25 mg total) rectally every 6 (six) hours as needed for nausea or vomiting. 6 each 0  . sulfamethoxazole-trimethoprim (BACTRIM DS,SEPTRA DS) 800-160 MG per tablet Take 1 tablet by mouth 2 (two) times daily.    . traMADol (ULTRAM) 50 MG tablet Take 1 tablet (50 mg total) by mouth every 4 (four) hours. Take with Tyenol 325 mg tablet 90 tablet 1  . valACYclovir (VALTREX) 1000 MG tablet Take 1,000 mg by mouth 2 (two) times daily.    . fentaNYL (DURAGESIC - DOSED MCG/HR) 12 MCG/HR PLACE 1 PATCH ONTO THE SKIN EVERY 3 DAYS  0  . ondansetron (ZOFRAN-ODT) 4 MG disintegrating tablet Take 1 tablet (4 mg total) by mouth 4 (four) times daily as needed for nausea or vomiting. 30 tablet 0   No current facility-administered medications for this visit.   Facility-Administered Medications Ordered in Other Visits  Medication Dose Route Frequency Provider Last Rate Last Dose  . sodium chloride 0.9 % injection 10 mL  10 mL Intracatheter PRN Johney Maine, MD   10 mL at 03/21/15 1025  . sodium chloride 0.9 % injection 10 mL  10 mL Intravenous PRN Johney Maine, MD   10 mL at 04/21/15 1025    OBJECTIVE:  Filed Vitals:   10/15/15 1223  BP: 122/82  Pulse: 69  Temp: 98.3 F (36.8 C)  Resp: 18     Body mass index is 31.64 kg/(m^2).     ECOG FS:0 - Asymptomatic  PHYSICAL EXAM: GENERAL:  Well developed, well nourished, sitting comfortably in the exam room in no acute distress. MENTAL STATUS:  Alert and oriented to person, place and time. HEAD:  .  Normocephalic, atraumatic, face symmetric, no Cushingoid features. EYES:   Pupils equal round and reactive to light and accomodation.  No conjunctivitis or scleral icterus. ENT:  Oropharynx clear without lesion.  Tongue normal. Mucous membranes moist.  RESPIRATORY:  Clear to auscultation without rales, wheezes or rhonchi. CARDIOVASCULAR:  Regular rate and rhythm without murmur, rub or gallop. BREAST:  Right breast without masses, skin changes or nipple discharge.  Left breast without masses, skin changes or nipple discharge. ABDOMEN:  Soft, non-tender, with active bowel sounds, and no hepatosplenomegaly.  No masses. BACK:  No CVA tenderness.  No tenderness on percussion of the back or rib cage. SKIN:  No rashes, ulcers or lesions. EXTREMITIES: No edema, no skin discoloration or tenderness.  No palpable cords. LYMPH NODES: No palpable cervical, supraclavicular, axillary or inguinal adenopathy  NEUROLOGICAL: Unremarkable. PSYCH:  Appropriate.   LAB RESULTS:  Appointment on 10/15/2015  Component Date Value Ref Range Status  . WBC 10/15/2015 3.2* 3.6 - 11.0 K/uL Final  . RBC 10/15/2015 3.75* 3.80 - 5.20 MIL/uL Final  . Hemoglobin 10/15/2015 10.3* 12.0 - 16.0 g/dL Final  . HCT 35/91/2613 30.9* 35.0 - 47.0 % Final  . MCV 10/15/2015 82.4  80.0 - 100.0 fL Final  . MCH 10/15/2015 27.4  26.0 - 34.0 pg Final  . MCHC 10/15/2015 33.2  32.0 - 36.0 g/dL Final  . RDW 43/30/8367 20.8* 11.5 - 14.5 % Final  . Platelets 10/15/2015 261  150 - 440 K/uL Final  . Neutrophils Relative % 10/15/2015 40   Final  . Neutro Abs 10/15/2015 1.3*  1.4 - 6.5 K/uL Final  . Lymphocytes Relative 10/15/2015 42   Final  . Lymphs Abs 10/15/2015 1.4  1.0 - 3.6 K/uL Final  . Monocytes Relative 10/15/2015 17    Final  . Monocytes Absolute 10/15/2015 0.5  0.2 - 0.9 K/uL Final  . Eosinophils Relative 10/15/2015 0   Final  . Eosinophils Absolute 10/15/2015 0.0  0 - 0.7 K/uL Final  . Basophils Relative 10/15/2015 1   Final  . Basophils Absolute 10/15/2015 0.0  0 - 0.1 K/uL Final  . Sodium 10/15/2015 137  135 - 145 mmol/L Final  . Potassium 10/15/2015 3.0* 3.5 - 5.1 mmol/L Final  . Chloride 10/15/2015 105  101 - 111 mmol/L Final  . CO2 10/15/2015 26  22 - 32 mmol/L Final  . Glucose, Bld 10/15/2015 97  65 - 99 mg/dL Final  . BUN 10/15/2015 <5* 6 - 20 mg/dL Final  . Creatinine, Ser 10/15/2015 0.72  0.44 - 1.00 mg/dL Final  . Calcium 10/15/2015 9.1  8.9 - 10.3 mg/dL Final  . Total Protein 10/15/2015 7.1  6.5 - 8.1 g/dL Final  . Albumin 10/15/2015 3.8  3.5 - 5.0 g/dL Final  . AST 10/15/2015 25  15 - 41 U/L Final  . ALT 10/15/2015 18  14 - 54 U/L Final  . Alkaline Phosphatase 10/15/2015 57  38 - 126 U/L Final  . Total Bilirubin 10/15/2015 0.5  0.3 - 1.2 mg/dL Final  . GFR calc non Af Amer 10/15/2015 >60  >60 mL/min Final  . GFR calc Af Amer 10/15/2015 >60  >60 mL/min Final   Comment: (NOTE) The eGFR has been calculated using the CKD EPI equation. This calculation has not been validated in all clinical situations. eGFR's persistently <60 mL/min signify possible Chronic Kidney Disease.   . Anion gap 10/15/2015 6  5 - 15 Final    No results found for: LABCA2 Lab Results  Component Value Date   CA199 <1 02/03/2014   Lab Results  Component Value Date   CEA 6.8* 10/01/2015   Component     Latest Ref Rng 01/25/2015 03/08/2015 03/21/2015 04/18/2015 05/23/2015  CEA     0.0 - 4.7 ng/mL 4.8 (H) 6.0 (H) 5.0 (H) 4.2 4.7   Component     Latest Ref Rng 06/13/2015  CEA     0.0 - 4.7 ng/mL 4.4          Electronically Signed  By: Abigail Miyamoto M.D.  On: 07/27/2015 10:33 ASSESSMENT: Stage IV carcinoma of colon CT scan has been reviewed independently shows progressive soft tissue density  around peripancreatic area We will discuss this case in possibility of biopsy through EUS can be considered. Meanwhile we will stop chemotherapy and change treatment to  TAS-102. 35 mg/m 5 days a week for 2 weeks and 2 weeks off Various side effects of this treatment has been discussed with the patient and family 2.  Patient is tolerating TAS-102 without any significant side effect. Significant improvement in back pain Will continue present therapy to reevaluate with another CEA in 4 weeks  October 01, 2015  Abnormal visual disturbance with headache.  Most likely reaction to medication MRI scan of brain has been reviewed independently there is no evidence of metastases disease.  2.  Significant weight loss Reevaluate patient with another PET scan Recheck lab ContinueTAS-102 Discussed that with the patient. Continue  Chemotherapy.  Duration of visit is 25 minutes and 50% of time was spent discussing   MRI scan result.  Weight  loss.  And need for repeating PET scanning . Patient's insurance does not approve PET scan and then CT scan of abdomen and pelvis be done  Colon cancer   Staging form: Colon and Rectum, AJCC 7th Edition     Clinical: Stage IVB (yT3, N1, M1b) - Signed by [name], MD on 03/07/2015   [name], MD   10/15/2015 1:38 PM

## 2015-10-15 NOTE — Progress Notes (Signed)
Patient here for MRI results.  States she had a fall last week.  Tripped over something.  Has been taking Ibuprofen.

## 2015-10-15 NOTE — Addendum Note (Signed)
Addended by: Telford Nab on: 10/15/2015 01:48 PM   Modules accepted: Orders

## 2015-10-17 ENCOUNTER — Other Ambulatory Visit: Payer: Self-pay | Admitting: *Deleted

## 2015-10-17 DIAGNOSIS — C189 Malignant neoplasm of colon, unspecified: Secondary | ICD-10-CM

## 2015-10-19 ENCOUNTER — Ambulatory Visit: Admission: RE | Admit: 2015-10-19 | Payer: 59 | Source: Ambulatory Visit

## 2015-10-19 ENCOUNTER — Ambulatory Visit
Admission: RE | Admit: 2015-10-19 | Discharge: 2015-10-19 | Disposition: A | Discharge status: Home / Self Care | Payer: 59 | Source: Ambulatory Visit | Attending: Oncology | Admitting: Oncology

## 2015-10-19 DIAGNOSIS — C189 Malignant neoplasm of colon, unspecified: Secondary | ICD-10-CM | POA: Insufficient documentation

## 2015-10-19 MED ORDER — IOHEXOL 300 MG/ML  SOLN
100.0000 mL | Freq: Once | INTRAMUSCULAR | Status: AC | PRN
  Administered 2015-10-19: 100 mL via INTRAVENOUS

## 2015-11-12 ENCOUNTER — Encounter: Payer: Self-pay | Admitting: Oncology

## 2015-11-12 ENCOUNTER — Inpatient Hospital Stay: Payer: 59 | Attending: Oncology | Admitting: Oncology

## 2015-11-12 ENCOUNTER — Inpatient Hospital Stay: Payer: 59

## 2015-11-12 ENCOUNTER — Other Ambulatory Visit: Payer: Self-pay | Admitting: Oncology

## 2015-11-12 VITALS — BP 129/83 | HR 77 | Temp 98.0°F | Wt 172.2 lb

## 2015-11-12 DIAGNOSIS — H539 Unspecified visual disturbance: Secondary | ICD-10-CM | POA: Diagnosis not present

## 2015-11-12 DIAGNOSIS — Z9181 History of falling: Secondary | ICD-10-CM | POA: Diagnosis not present

## 2015-11-12 DIAGNOSIS — R634 Abnormal weight loss: Secondary | ICD-10-CM | POA: Insufficient documentation

## 2015-11-12 DIAGNOSIS — C185 Malignant neoplasm of splenic flexure: Secondary | ICD-10-CM

## 2015-11-12 DIAGNOSIS — M549 Dorsalgia, unspecified: Secondary | ICD-10-CM | POA: Diagnosis not present

## 2015-11-12 DIAGNOSIS — R5381 Other malaise: Secondary | ICD-10-CM | POA: Insufficient documentation

## 2015-11-12 DIAGNOSIS — C787 Secondary malignant neoplasm of liver and intrahepatic bile duct: Secondary | ICD-10-CM | POA: Insufficient documentation

## 2015-11-12 DIAGNOSIS — R112 Nausea with vomiting, unspecified: Secondary | ICD-10-CM | POA: Diagnosis not present

## 2015-11-12 DIAGNOSIS — R110 Nausea: Secondary | ICD-10-CM | POA: Diagnosis not present

## 2015-11-12 DIAGNOSIS — R1013 Epigastric pain: Secondary | ICD-10-CM | POA: Diagnosis not present

## 2015-11-12 DIAGNOSIS — R630 Anorexia: Secondary | ICD-10-CM | POA: Diagnosis not present

## 2015-11-12 DIAGNOSIS — R51 Headache: Secondary | ICD-10-CM | POA: Diagnosis not present

## 2015-11-12 DIAGNOSIS — Z888 Allergy status to other drugs, medicaments and biological substances status: Secondary | ICD-10-CM | POA: Diagnosis not present

## 2015-11-12 DIAGNOSIS — M25552 Pain in left hip: Secondary | ICD-10-CM | POA: Insufficient documentation

## 2015-11-12 DIAGNOSIS — R5383 Other fatigue: Secondary | ICD-10-CM | POA: Insufficient documentation

## 2015-11-12 DIAGNOSIS — Z9221 Personal history of antineoplastic chemotherapy: Secondary | ICD-10-CM | POA: Diagnosis not present

## 2015-11-12 DIAGNOSIS — R599 Enlarged lymph nodes, unspecified: Secondary | ICD-10-CM | POA: Insufficient documentation

## 2015-11-12 DIAGNOSIS — Z79899 Other long term (current) drug therapy: Secondary | ICD-10-CM | POA: Diagnosis not present

## 2015-11-12 DIAGNOSIS — K7689 Other specified diseases of liver: Secondary | ICD-10-CM | POA: Diagnosis not present

## 2015-11-12 DIAGNOSIS — C189 Malignant neoplasm of colon, unspecified: Secondary | ICD-10-CM

## 2015-11-12 DIAGNOSIS — K219 Gastro-esophageal reflux disease without esophagitis: Secondary | ICD-10-CM

## 2015-11-12 LAB — CBC WITH DIFFERENTIAL/PLATELET
BASOS ABS: 0 10*3/uL (ref 0–0.1)
Basophils Relative: 0 %
EOS PCT: 0 %
Eosinophils Absolute: 0 10*3/uL (ref 0–0.7)
HCT: 30.3 % — ABNORMAL LOW (ref 35.0–47.0)
Hemoglobin: 10.4 g/dL — ABNORMAL LOW (ref 12.0–16.0)
LYMPHS ABS: 1.3 10*3/uL (ref 1.0–3.6)
LYMPHS PCT: 37 %
MCH: 28.7 pg (ref 26.0–34.0)
MCHC: 34.4 g/dL (ref 32.0–36.0)
MCV: 83.4 fL (ref 80.0–100.0)
MONO ABS: 0.4 10*3/uL (ref 0.2–0.9)
Monocytes Relative: 12 %
NEUTROS ABS: 1.8 10*3/uL (ref 1.4–6.5)
Neutrophils Relative %: 51 %
PLATELETS: 264 10*3/uL (ref 150–440)
RBC: 3.64 MIL/uL — ABNORMAL LOW (ref 3.80–5.20)
RDW: 25.3 % — AB (ref 11.5–14.5)
WBC: 3.6 10*3/uL (ref 3.6–11.0)

## 2015-11-12 LAB — COMPREHENSIVE METABOLIC PANEL
ALT: 14 U/L (ref 14–54)
AST: 18 U/L (ref 15–41)
Albumin: 3.9 g/dL (ref 3.5–5.0)
Alkaline Phosphatase: 56 U/L (ref 38–126)
Anion gap: 6 (ref 5–15)
BUN: <5 mg/dL — ABNORMAL LOW (ref 6–20)
CHLORIDE: 104 mmol/L (ref 101–111)
CO2: 27 mmol/L (ref 22–32)
Calcium: 9.4 mg/dL (ref 8.9–10.3)
Creatinine, Ser: 0.54 mg/dL (ref 0.44–1.00)
Glucose, Bld: 105 mg/dL — ABNORMAL HIGH (ref 65–99)
POTASSIUM: 3.2 mmol/L — AB (ref 3.5–5.1)
SODIUM: 137 mmol/L (ref 135–145)
Total Bilirubin: 0.7 mg/dL (ref 0.3–1.2)
Total Protein: 7.1 g/dL (ref 6.5–8.1)

## 2015-11-12 MED ORDER — PREDNISONE 10 MG (21) PO TBPK: ORAL_TABLET | ORAL | Status: DC

## 2015-11-12 MED ORDER — TRAMADOL HCL 50 MG PO TABS: 50.0000 mg | ORAL_TABLET | Freq: Once | ORAL | Status: DC

## 2015-11-12 MED ORDER — TRAMADOL HCL 50 MG PO TABS: 50.0000 mg | ORAL_TABLET | Freq: Every morning | ORAL | Status: DC

## 2015-11-12 MED ORDER — TRAMADOL HCL 50 MG PO TABS: 50.0000 mg | ORAL_TABLET | Freq: Four times a day (QID) | ORAL | Status: DC

## 2015-11-12 MED ORDER — OMEPRAZOLE 20 MG PO CPDR: 20.0000 mg | DELAYED_RELEASE_CAPSULE | Freq: Two times a day (BID) | ORAL | Status: AC

## 2015-11-12 NOTE — Addendum Note (Signed)
Addended by: Livia Snellen on: 11/12/2015 03:36 PM   Modules accepted: Orders

## 2015-11-12 NOTE — Addendum Note (Signed)
Addended by: Livia Snellen on: 11/12/2015 03:32 PM   Modules accepted: Orders

## 2015-11-12 NOTE — Progress Notes (Signed)
Patient states she has had back pain for about a week.  She took out the trash and had to throw it up on the container.  Since then she has had back pain in her mid back - more on the right side.  She also states she has had stomach cramping for the past week also.

## 2015-11-12 NOTE — Progress Notes (Signed)
[place] @ [facility] [phone]  Fax:([phone]) Leonidas: July 15, 1971  MR#: [id number]  RFF#:[id number]  Patient Care Team: Lynnell Jude, MD as PCP - General (Family Medicine)  CHIEF COMPLAINT:  Chief Complaint  Patient presents with  . Colon Cancer   Oncology History   1. Carcinoma of the colon, splenic  flexure.   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 2. K-RAS mutated. 3. Patient started on FOLFOX and Avastin 4 th June, 2015 4. Allergic reaction  characterized by generalized itching and redness  of face and palm on June 15, 2014 5. Started on FOLFIRI  and avastin   July 13, 2014 6. Finished 12 cycles of chemotherapy, 5-FU based FOLFOX was changed to FOLFIRI BECAUSE OF SENSITIVITY TO OXALIPLATIN(September 07, 2014) 7. Started on maintenance chemotherapy with 5-FU leucovorin and Avastin from September 28, 2014 8.  Progressive disease based on tumor markers as well as PET scan's so patient was started on FOLFOX chemotherapy with desensitizing  protocol.   9.  Progressing disease by CT scan and by symptoms.  Patient will be started on TAS-102 from October, 2016   Oncology Flowsheet 06/14/2015 06/14/2015 06/14/2015 06/14/2015 06/14/2015 07/05/2015 07/05/2015  Day, Cycle Day 1, Cycle 3         Day 1, Cycle 1    dexamethasone (DECADRON) IV [ 10 mg ]         [ 10 mg ]    fluorouracil (ADRUCIL) IV 750 mg 3,600 mg       400 mg/m2 2,000 mg/m2  fosaprepitant (EMEND) IV - - - - - - -  leucovorin IV 750 mg         20 mg/m2    methylPREDNISolone sodium succinate 125 mg/2 mL (SOLU-MEDROL) IV 40 mg 40 mg 40 mg 40 mg 40 mg - -  ondansetron (ZOFRAN) IV [ 8 mg ]         [ 8 mg ]    oxaliplatin (ELOXATIN) IV 165.75 mg 165.75 mg 165.75 mg 165.75 mg 165.75 mg - -  palonosetron (ALOXI) IV - - - - - - -    INTERVAL HISTORY: \  45 year old lady with recurrent carcinoma of  colon started on TAS-102. Patient is here for ongoing evaluation and treatment consideration.  Tolerating treatment very well.  Patient had increasing back pain which is completely resolved at present time.  Patient has increasing back pain starting few days ago constant dull aching taking number of ibuprofen without much help.  Patient cannot tolerate Vicodin or fentanyl patch.  Because of rash and swelling of the lips Back pain is constant and more on the right side. Patient is losing weight.  Appetite has been poor.  Has persistent nausea.  Patient is here with the family for further follow-up      REVIEW OF SYSTEMS:   GENERAL:  Feels good.  Active.  No fevers, sweats or weight loss. PERFORMANCE STATUS (ECOG): 0 HEENT:  No visual changes, runny nose, sore throat, mouth sores or tenderness.  Mild headache Visual disturbances as described about Lungs: No shortness of breath or cough.  No hemoptysis. Cardiac:  No chest pain, palpitations, orthopnea, or PND. GI: Nausea following chemotherapy lasting for more than 24 hours which is now resolved.  No diarrhea. GU:  No urgency, frequency, dysuria, or hematuria. Musculoskeletal:  Back pain as described  above Extremities:  No pain or swelling.  Pain in the left hip after fall.took motrn Skin:  No rashes or skin changes. Neuro:  No headache, numbness or weakness, balance or coordination issues. Endocrine:  No diabetes, thyroid issues, hot flashes or night sweats. Psych:  No mood changes, depression or anxiety. Pain:  No focal pain. Review of systems:  All other systems reviewed and found to be negative.  As per HPI. Otherwise, a complete review of systems is negatve.  PAST MEDICAL HISTORY: Past Medical History  Diagnosis Date  . Ulcer   . Anemia   . Obesity   . Constipation   . Migraines   . Cancer ([place]) 2015    colon  . Colon cancer metastasized to liver [facility]) April 2015    T3, N2, M1, stage IV with biopsy-proven liver metastases.  On adjuvant chemotherapy    PAST SURGICAL HISTORY: Past Surgical History  Procedure Laterality Date  . Cholecystectomy  2014  . Tubal ligation  2005  . Colonoscopy  2015  . Upper gi endoscopy  2015    FAMILY HISTORY No significant family history of malignancy       ADVANCED DIRECTIVES:   Patient does have advanced healthcare directive HEALTH MAINTENANCE: Social History  Substance Use Topics  . Smoking status: Never Smoker   . Smokeless tobacco: None  . Alcohol Use: No      Allergies  Allergen Reactions  . Fentanyl Swelling    Swelling of lips  . Oxaliplatin Itching  . Hydrocodone-Acetaminophen Hives    Current Outpatient Prescriptions  Medication Sig Dispense Refill  . acetaminophen (TYLENOL) 500 MG tablet Take 500 mg by mouth every 6 (six) hours as needed.    . diphenoxylate-atropine (LOMOTIL) 2.5-0.025 MG per tablet Take 1 tablet by mouth 4 (four) times daily as needed for diarrhea or loose stools (2 tabs to start, then one by mouth every 6 hours if needed for diarrhea.). 30 tablet 0  . fentaNYL (DURAGESIC - DOSED MCG/HR) 12 MCG/HR PLACE 1 PATCH ONTO THE SKIN EVERY 3 DAYS  0  . ferrous fumarate-iron polysaccharide complex (TANDEM) 162-115.2 MG CAPS Take 1 capsule by mouth 2 (two) times daily.    Marland Kitchen gabapentin (NEURONTIN) 300 MG capsule Take 300 mg by mouth 2 (two) times daily.    Marland Kitchen HYDROcodone-acetaminophen (NORCO/VICODIN) 5-325 MG tablet TAKE 1 TABLET BY MOUTH EVERY 6 HOURS AS NEEDED FOR MODERATE PAIN  0  . ibuprofen (ADVIL,MOTRIN) 800 MG tablet Take 800 mg by mouth 3 (three) times daily.    Marland Kitchen ibuprofen (ADVIL,MOTRIN) 800 MG tablet Take 800 mg by mouth.    . lidocaine (LIDODERM) 5 % Place 1 patch onto the skin daily. Remove & Discard patch within 12 hours or as directed by MD    . LONSURF 15-6.14 MG tablet     . LONSURF 20-8.19 MG tablet     . methocarbamol (ROBAXIN) 750 MG tablet Take 750 mg by mouth 2 (two) times daily. 2 tabs    . ondansetron (ZOFRAN-ODT) 4  MG disintegrating tablet Take 1 tablet (4 mg total) by mouth 4 (four) times daily as needed for nausea or vomiting. 30 tablet 0  . pantoprazole (PROTONIX) 40 MG tablet Take 40 mg by mouth 2 (two) times daily.    . polyethylene glycol (MIRALAX / GLYCOLAX) packet Take 17 g by mouth daily.    . potassium chloride (KLOR-CON) 20 MEQ packet Take 20 mEq by mouth daily. 30 packet 3  . potassium chloride SA (  K-DUR,KLOR-CON) 20 MEQ tablet Take 1 tablet (20 mEq total) by mouth daily. 30 tablet 3  . predniSONE (DELTASONE) 50 MG tablet     . promethazine (PHENERGAN) 25 MG suppository Place 1 suppository (25 mg total) rectally every 6 (six) hours as needed for nausea or vomiting. 6 each 0  . sulfamethoxazole-trimethoprim (BACTRIM DS,SEPTRA DS) 800-160 MG per tablet Take 1 tablet by mouth 2 (two) times daily.    . traMADol (ULTRAM) 50 MG tablet Take 1 tablet (50 mg total) by mouth every 4 (four) hours. Take with Tyenol 325 mg tablet 90 tablet 1  . valACYclovir (VALTREX) 1000 MG tablet Take 1,000 mg by mouth 2 (two) times daily.     No current facility-administered medications for this visit.   Facility-Administered Medications Ordered in Other Visits  Medication Dose Route Frequency Provider Last Rate Last Dose  . sodium chloride 0.9 % injection 10 mL  10 mL Intracatheter PRN [name], MD   10 mL at 03/21/15 1025  . sodium chloride 0.9 % injection 10 mL  10 mL Intravenous PRN [name], MD   10 mL at 04/21/15 1025    OBJECTIVE:  Filed Vitals:   11/12/15 1120  BP: 129/83  Pulse: 77  Temp: 98 F (36.7 C)     Body mass index is 30.51 kg/(m^2).    ECOG FS:0 - Asymptomatic  PHYSICAL EXAM: GENERAL:  Well developed, well nourished, sitting comfortably in the exam room in no acute distress. MENTAL STATUS:  Alert and oriented to person, place and time. HEAD:  .  Normocephalic, atraumatic, face symmetric, no Cushingoid features. EYES:   Pupils equal round and reactive to light and accomodation.  No  conjunctivitis or scleral icterus. ENT:  Oropharynx clear without lesion.  Tongue normal. Mucous membranes moist.  RESPIRATORY:  Clear to auscultation without rales, wheezes or rhonchi. CARDIOVASCULAR:  Regular rate and rhythm without murmur, rub or gallop. BREAST:  Right breast without masses, skin changes or nipple discharge.  Left breast without masses, skin changes or nipple discharge. ABDOMEN:  Soft, non-tender, with active bowel sounds, and no hepatosplenomegaly.  No masses. BACK:  No CVA tenderness.  No tenderness on percussion of the back or rib cage. SKIN:  No rashes, ulcers or lesions. EXTREMITIES: No edema, no skin discoloration or tenderness.  No palpable cords. LYMPH NODES: No palpable cervical, supraclavicular, axillary or inguinal adenopathy  NEUROLOGICAL: Unremarkable. PSYCH:  Appropriate.   LAB RESULTS:  Appointment on 11/12/2015  Component Date Value Ref Range Status  . WBC 11/12/2015 3.6  3.6 - 11.0 K/uL Final  . RBC 11/12/2015 3.64* 3.80 - 5.20 MIL/uL Final  . Hemoglobin 11/12/2015 10.4* 12.0 - 16.0 g/dL Final  . HCT 11/12/2015 30.3* 35.0 - 47.0 % Final  . MCV 11/12/2015 83.4  80.0 - 100.0 fL Final  . MCH 11/12/2015 28.7  26.0 - 34.0 pg Final  . MCHC 11/12/2015 34.4  32.0 - 36.0 g/dL Final  . RDW 11/12/2015 25.3* 11.5 - 14.5 % Final  . Platelets 11/12/2015 264  150 - 440 K/uL Final  . Neutrophils Relative % 11/12/2015 51   Final  . Neutro Abs 11/12/2015 1.8  1.4 - 6.5 K/uL Final  . Lymphocytes Relative 11/12/2015 37   Final  . Lymphs Abs 11/12/2015 1.3  1.0 - 3.6 K/uL Final  . Monocytes Relative 11/12/2015 12   Final  . Monocytes Absolute 11/12/2015 0.4  0.2 - 0.9 K/uL Final  . Eosinophils Relative 11/12/2015 0   Final  .  Eosinophils Absolute 11/12/2015 0.0  0 - 0.7 K/uL Final  . Basophils Relative 11/12/2015 0   Final  . Basophils Absolute 11/12/2015 0.0  0 - 0.1 K/uL Final  . Sodium 11/12/2015 137  135 - 145 mmol/L Final  . Potassium 11/12/2015 3.2* 3.5 -  5.1 mmol/L Final  . Chloride 11/12/2015 104  101 - 111 mmol/L Final  . CO2 11/12/2015 27  22 - 32 mmol/L Final  . Glucose, Bld 11/12/2015 105* 65 - 99 mg/dL Final  . BUN 11/12/2015 <5* 6 - 20 mg/dL Final  . Creatinine, Ser 11/12/2015 0.54  0.44 - 1.00 mg/dL Final  . Calcium 11/12/2015 9.4  8.9 - 10.3 mg/dL Final  . Total Protein 11/12/2015 7.1  6.5 - 8.1 g/dL Final  . Albumin 11/12/2015 3.9  3.5 - 5.0 g/dL Final  . AST 11/12/2015 18  15 - 41 U/L Final  . ALT 11/12/2015 14  14 - 54 U/L Final  . Alkaline Phosphatase 11/12/2015 56  38 - 126 U/L Final  . Total Bilirubin 11/12/2015 0.7  0.3 - 1.2 mg/dL Final  . GFR calc non Af Amer 11/12/2015 >60  >60 mL/min Final  . GFR calc Af Amer 11/12/2015 >60  >60 mL/min Final   Comment: (NOTE) The eGFR has been calculated using the CKD EPI equation. This calculation has not been validated in all clinical situations. eGFR's persistently <60 mL/min signify possible Chronic Kidney Disease.   . Anion gap 11/12/2015 6  5 - 15 Final    No results found for: LABCA2 Lab Results  Component Value Date   CA199 <1 02/03/2014   Lab Results  Component Value Date   CEA 6.8* 10/01/2015   Component     Latest Ref Rng 01/25/2015 03/08/2015 03/21/2015 04/18/2015 05/23/2015  CEA     0.0 - 4.7 ng/mL 4.8 (H) 6.0 (H) 5.0 (H) 4.2 4.7   Component     Latest Ref Rng 06/13/2015  CEA     0.0 - 4.7 ng/mL 4.4          Electronically Signed  By: Abigail Miyamoto M.D.  On: 07/27/2015 10:33 ASSESSMENT: Stage IV carcinoma of colon CT scan has been reviewed independently shows progressive soft tissue density around peripancreatic area We will discuss this case in possibility of biopsy through EUS can be considered. Meanwhile we will stop chemotherapy and change treatment to  TAS-102. 35 mg/m 5 days a week for 2 weeks and 2 weeks off Various side effects of this treatment has been discussed with the patient and family 2.  Patient is tolerating TAS-102 without  any significant side effect. Significant improvement in back pain Will continue present therapy to reevaluate with another CEA in 4 weeks  October 01, 2015  Abnormal visual disturbance with headache.  Most likely reaction to medication MRI scan of brain has been reviewed independently there is no evidence of metastases disease.  2.  Significant weight loss Reevaluate patient with another PET scan Recheck lab ContinueTAS-102 Discussed that with the patient. Continue  Chemotherapy.  Duration of visit is 25 minutes and 50% of time was spent discussing   MRI scan result.  Weight loss.  And need for repeating PET scanning . Patient's insurance does not approve PET scan and then CT scan of abdomen and pelvis be done  Colon cancer   Staging form: Colon and Rectum, AJCC 7th Edition     Clinical: Stage IVB (yT3, N1, M1b) - Signed by [name], MD on 03/07/2015  [name], MD   11/12/2015 12:03 PM  [place] @ [place] [phone]) [phone]  Fax:(336) [phone]     Misty Mack OB: 1970-11-03  MR#: [id number]  UXN#:[id number]  Patient Care Team: Lynnell Jude, MD as PCP - General (Family Medicine)  CHIEF COMPLAINT:  Chief Complaint  Patient presents with  . Colon Cancer   Oncology History   1. Carcinoma of the colon, splenic  flexure.   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 2. K-RAS mutated. 3. Patient started on FOLFOX and Avastin 4 th June, 2015 4. Allergic reaction  characterized by generalized itching and redness  of face and palm on June 15, 2014 5. Started on FOLFIRI  and avastin   July 13, 2014 6. Finished 12 cycles of chemotherapy, 5-FU based FOLFOX was changed to FOLFIRI BECAUSE OF SENSITIVITY TO OXALIPLATIN(September 07, 2014) 7. Started on maintenance chemotherapy with 5-FU leucovorin and Avastin from September 28, 2014 8.  Progressive disease based on  tumor markers as well as PET scan's so patient was started on FOLFOX chemotherapy with desensitizing  protocol.   9.  Progressing disease by CT scan and by symptoms.  Patient will be started on TAS-102 from October, 2016   Oncology Flowsheet 06/14/2015 06/14/2015 06/14/2015 06/14/2015 06/14/2015 07/05/2015 07/05/2015  Day, Cycle Day 1, Cycle 3         Day 1, Cycle 1    dexamethasone (DECADRON) IV [ 10 mg ]         [ 10 mg ]    fluorouracil (ADRUCIL) IV 750 mg 3,600 mg       400 mg/m2 2,000 mg/m2  fosaprepitant (EMEND) IV - - - - - - -  leucovorin IV 750 mg         20 mg/m2    methylPREDNISolone sodium succinate 125 mg/2 mL (SOLU-MEDROL) IV 40 mg 40 mg 40 mg 40 mg 40 mg - -  ondansetron (ZOFRAN) IV [ 8 mg ]         [ 8 mg ]    oxaliplatin (ELOXATIN) IV 165.75 mg 165.75 mg 165.75 mg 165.75 mg 165.75 mg - -  palonosetron (ALOXI) IV - - - - - - -    INTERVAL HISTORY: \  45 year old lady with recurrent carcinoma of colon started on TAS-102. Patient is here for ongoing evaluation and treatment consideration.  Tolerating treatment very well.  Patient had increasing back pain which is completely resolved at present time.  No nausea no vomiting no rash noted diarrhea. This and came is an acute add-on this morning when working on computer Patient reason got blurred.  According to her both eyes patient started seeing fuzzy things.  Lasted for a while.  Has a different color and things were fuzzy  The patient was evaluated by this time reason abnormality is resolved. Patient has finished second cycle of  tas-102 . Patient had MRI scan of brain which has been reviewed independently.  There is no evidence of metastases to the brain Visual disturbances has completely resolved Continues to have back pain Continues to lose weight taking small frequent   Meals  Recently fell and had a hip injury was evaluated at [facility] emergency room took Motrin.  Pain is better. REVIEW OF SYSTEMS:   GENERAL:  Feels good.  Active.   No fevers, sweats or weight loss. PERFORMANCE STATUS (ECOG): 0 HEENT:  No  visual changes, runny nose, sore throat, mouth sores or tenderness.  Mild headache Visual disturbances as described about Lungs: No shortness of breath or cough.  No hemoptysis. Cardiac:  No chest pain, palpitations, orthopnea, or PND. GI: Nausea following chemotherapy lasting for more than 24 hours which is now resolved.  No diarrhea. GU:  No urgency, frequency, dysuria, or hematuria. Musculoskeletal:  Back pain as described above Extremities:  No pain or swelling.  Pain in the left hip after fall.took motrn Skin:  No rashes or skin changes. Neuro:  No headache, numbness or weakness, balance or coordination issues. Endocrine:  No diabetes, thyroid issues, hot flashes or night sweats. Psych:  No mood changes, depression or anxiety. Pain:  No focal pain. Review of systems:  All other systems reviewed and found to be negative.  As per HPI. Otherwise, a complete review of systems is negatve.  PAST MEDICAL HISTORY: Past Medical History  Diagnosis Date  . Ulcer   . Anemia   . Obesity   . Constipation   . Migraines   . Cancer ([place]) 2015    colon  . Colon cancer metastasized to liver [facility]) April 2015    T3, N2, M1, stage IV with biopsy-proven liver metastases. On adjuvant chemotherapy    PAST SURGICAL HISTORY: Past Surgical History  Procedure Laterality Date  . Cholecystectomy  2014  . Tubal ligation  2005  . Colonoscopy  2015  . Upper gi endoscopy  2015    FAMILY HISTORY No significant family history of malignancy       ADVANCED DIRECTIVES:   Patient does have advanced healthcare directive HEALTH MAINTENANCE: Social History  Substance Use Topics  . Smoking status: Never Smoker   . Smokeless tobacco: None  . Alcohol Use: No      Allergies  Allergen Reactions  . Fentanyl Swelling    Swelling of lips  . Oxaliplatin Itching  . Hydrocodone-Acetaminophen Hives    Current Outpatient  Prescriptions  Medication Sig Dispense Refill  . acetaminophen (TYLENOL) 500 MG tablet Take 500 mg by mouth every 6 (six) hours as needed.    . diphenoxylate-atropine (LOMOTIL) 2.5-0.025 MG per tablet Take 1 tablet by mouth 4 (four) times daily as needed for diarrhea or loose stools (2 tabs to start, then one by mouth every 6 hours if needed for diarrhea.). 30 tablet 0  . fentaNYL (DURAGESIC - DOSED MCG/HR) 12 MCG/HR PLACE 1 PATCH ONTO THE SKIN EVERY 3 DAYS  0  . ferrous fumarate-iron polysaccharide complex (TANDEM) 162-115.2 MG CAPS Take 1 capsule by mouth 2 (two) times daily.    Marland Kitchen gabapentin (NEURONTIN) 300 MG capsule Take 300 mg by mouth 2 (two) times daily.    Marland Kitchen HYDROcodone-acetaminophen (NORCO/VICODIN) 5-325 MG tablet TAKE 1 TABLET BY MOUTH EVERY 6 HOURS AS NEEDED FOR MODERATE PAIN  0  . ibuprofen (ADVIL,MOTRIN) 800 MG tablet Take 800 mg by mouth 3 (three) times daily.    Marland Kitchen ibuprofen (ADVIL,MOTRIN) 800 MG tablet Take 800 mg by mouth.    . lidocaine (LIDODERM) 5 % Place 1 patch onto the skin daily. Remove & Discard patch within 12 hours or as directed by MD    . LONSURF 15-6.14 MG tablet     . LONSURF 20-8.19 MG tablet     . methocarbamol (ROBAXIN) 750 MG tablet Take 750 mg by mouth 2 (two) times daily. 2 tabs    . ondansetron (ZOFRAN-ODT) 4 MG disintegrating tablet Take 1 tablet (4 mg total) by mouth 4 (four)  times daily as needed for nausea or vomiting. 30 tablet 0  . pantoprazole (PROTONIX) 40 MG tablet Take 40 mg by mouth 2 (two) times daily.    . polyethylene glycol (MIRALAX / GLYCOLAX) packet Take 17 g by mouth daily.    . potassium chloride (KLOR-CON) 20 MEQ packet Take 20 mEq by mouth daily. 30 packet 3  . potassium chloride SA (K-DUR,KLOR-CON) 20 MEQ tablet Take 1 tablet (20 mEq total) by mouth daily. 30 tablet 3  . predniSONE (DELTASONE) 50 MG tablet     . promethazine (PHENERGAN) 25 MG suppository Place 1 suppository (25 mg total) rectally every 6 (six) hours as needed for nausea  or vomiting. 6 each 0  . sulfamethoxazole-trimethoprim (BACTRIM DS,SEPTRA DS) 800-160 MG per tablet Take 1 tablet by mouth 2 (two) times daily.    . traMADol (ULTRAM) 50 MG tablet Take 1 tablet (50 mg total) by mouth 4 (four) times daily. Take with Tyenol 325 mg tablet 60 tablet 1  . valACYclovir (VALTREX) 1000 MG tablet Take 1,000 mg by mouth 2 (two) times daily.    Marland Kitchen omeprazole (PRILOSEC) 20 MG capsule Take 1 capsule (20 mg total) by mouth 2 (two) times daily before a meal. 60 capsule 5  . predniSONE (STERAPRED UNI-PAK 21 TAB) 10 MG (21) TBPK tablet Take 60 mg po day 1, 50 mg po day 2, 40 mg po day 3, 30 mg po day 4, 20 mg po day 5, 10 mg po day 6 21 tablet 0   No current facility-administered medications for this visit.   Facility-Administered Medications Ordered in Other Visits  Medication Dose Route Frequency Provider Last Rate Last Dose  . sodium chloride 0.9 % injection 10 mL  10 mL Intracatheter PRN [name], MD   10 mL at 03/21/15 1025  . sodium chloride 0.9 % injection 10 mL  10 mL Intravenous PRN [name], MD   10 mL at 04/21/15 1025    OBJECTIVE:  Filed Vitals:   11/12/15 1120  BP: 129/83  Pulse: 77  Temp: 98 F (36.7 C)     Body mass index is 30.51 kg/(m^2).    ECOG FS:0 - Asymptomatic  PHYSICAL EXAM: GENERAL:  Well developed, well nourished, sitting comfortably in the exam room in no acute distress. MENTAL STATUS:  Alert and oriented to person, place and time. HEAD:  .  Normocephalic, atraumatic, face symmetric, no Cushingoid features. EYES:   Pupils equal round and reactive to light and accomodation.  No conjunctivitis or scleral icterus. ENT:  Oropharynx clear without lesion.  Tongue normal. Mucous membranes moist.  RESPIRATORY:  Clear to auscultation without rales, wheezes or rhonchi. CARDIOVASCULAR:  Regular rate and rhythm without murmur, rub or gallop. BREAST:  Right breast without masses, skin changes or nipple discharge.  Left breast without masses, skin  changes or nipple discharge. ABDOMEN:  Soft, non-tender, with active bowel sounds, and no hepatosplenomegaly.  No masses. BACK:  No CVA tenderness.  No tenderness on percussion of the back or rib cage. SKIN:  No rashes, ulcers or lesions. EXTREMITIES: No edema, no skin discoloration or tenderness.  No palpable cords. LYMPH NODES: No palpable cervical, supraclavicular, axillary or inguinal adenopathy  NEUROLOGICAL: Unremarkable. PSYCH:  Appropriate.   LAB RESULTS:  Appointment on 11/12/2015  Component Date Value Ref Range Status  . WBC 11/12/2015 3.6  3.6 - 11.0 K/uL Final  . RBC 11/12/2015 3.64* 3.80 - 5.20 MIL/uL Final  . Hemoglobin 11/12/2015 10.4* 12.0 - 16.0 g/dL Final  .  HCT 11/12/2015 30.3* 35.0 - 47.0 % Final  . MCV 11/12/2015 83.4  80.0 - 100.0 fL Final  . MCH 11/12/2015 28.7  26.0 - 34.0 pg Final  . MCHC 11/12/2015 34.4  32.0 - 36.0 g/dL Final  . RDW 11/12/2015 25.3* 11.5 - 14.5 % Final  . Platelets 11/12/2015 264  150 - 440 K/uL Final  . Neutrophils Relative % 11/12/2015 51   Final  . Neutro Abs 11/12/2015 1.8  1.4 - 6.5 K/uL Final  . Lymphocytes Relative 11/12/2015 37   Final  . Lymphs Abs 11/12/2015 1.3  1.0 - 3.6 K/uL Final  . Monocytes Relative 11/12/2015 12   Final  . Monocytes Absolute 11/12/2015 0.4  0.2 - 0.9 K/uL Final  . Eosinophils Relative 11/12/2015 0   Final  . Eosinophils Absolute 11/12/2015 0.0  0 - 0.7 K/uL Final  . Basophils Relative 11/12/2015 0   Final  . Basophils Absolute 11/12/2015 0.0  0 - 0.1 K/uL Final  . Sodium 11/12/2015 137  135 - 145 mmol/L Final  . Potassium 11/12/2015 3.2* 3.5 - 5.1 mmol/L Final  . Chloride 11/12/2015 104  101 - 111 mmol/L Final  . CO2 11/12/2015 27  22 - 32 mmol/L Final  . Glucose, Bld 11/12/2015 105* 65 - 99 mg/dL Final  . BUN 11/12/2015 <5* 6 - 20 mg/dL Final  . Creatinine, Ser 11/12/2015 0.54  0.44 - 1.00 mg/dL Final  . Calcium 11/12/2015 9.4  8.9 - 10.3 mg/dL Final  . Total Protein 11/12/2015 7.1  6.5 - 8.1 g/dL  Final  . Albumin 11/12/2015 3.9  3.5 - 5.0 g/dL Final  . AST 11/12/2015 18  15 - 41 U/L Final  . ALT 11/12/2015 14  14 - 54 U/L Final  . Alkaline Phosphatase 11/12/2015 56  38 - 126 U/L Final  . Total Bilirubin 11/12/2015 0.7  0.3 - 1.2 mg/dL Final  . GFR calc non Af Amer 11/12/2015 >60  >60 mL/min Final  . GFR calc Af Amer 11/12/2015 >60  >60 mL/min Final   Comment: (NOTE) The eGFR has been calculated using the CKD EPI equation. This calculation has not been validated in all clinical situations. eGFR's persistently <60 mL/min signify possible Chronic Kidney Disease.   . Anion gap 11/12/2015 6  5 - 15 Final    No results found for: LABCA2 Lab Results  Component Value Date   CA199 <1 02/03/2014   Lab Results  Component Value Date   CEA 6.8* 10/01/2015             ASSESSMENT: Stage IV carcinoma of colon Increasing CEA. Increasing back pain.  We will get MRI scan of thoracic spine possibility of radiation therapy to the retroperitoneal lymph node can be considered to control pain. Because of poor pain control be started patient on tramadol 50 mg 4 times a day Epigastric discomfort is secondary to increasing use of ibuprofen Will start patient on Prilosec Progressing back pain is most likely secondary to progressing retroperitoneal lymphadenopathy .  We will hold off  tas -102 Duration of visit is 25 minutes and 50% of time was spent discussing VARIOUS  options or palliative care with other physicians social worker Colon cancer   Staging form: Colon and Rectum, AJCC 7th Edition     Clinical: Stage IVB (yT3, N1, M1b) - Signed by [name], MD on 03/07/2015   [name], MD   11/12/2015 3:03 PM

## 2015-11-13 LAB — CEA: CEA: 6.2 ng/mL — ABNORMAL HIGH (ref 0.0–4.7)

## 2015-11-14 ENCOUNTER — Telehealth: Payer: Self-pay | Admitting: *Deleted

## 2015-11-14 NOTE — Telephone Encounter (Signed)
rcvd fax to [facility].  MD note on 11/12/15 had placed med on hold.  Wrote note on renewal fax that med placed on hold. Pt will f/u with md on 2/6 and re eval to restart or not. Faxed paper to [facility]

## 2015-11-15 ENCOUNTER — Inpatient Hospital Stay: Payer: 59

## 2015-11-15 ENCOUNTER — Telehealth: Payer: Self-pay | Admitting: *Deleted

## 2015-11-15 ENCOUNTER — Inpatient Hospital Stay (HOSPITAL_BASED_OUTPATIENT_CLINIC_OR_DEPARTMENT_OTHER): Payer: 59 | Admitting: Family Medicine

## 2015-11-15 VITALS — BP 119/68 | HR 95 | Temp 97.2°F | Resp 18 | Wt 166.4 lb

## 2015-11-15 DIAGNOSIS — R1013 Epigastric pain: Secondary | ICD-10-CM

## 2015-11-15 DIAGNOSIS — R109 Unspecified abdominal pain: Secondary | ICD-10-CM | POA: Insufficient documentation

## 2015-11-15 DIAGNOSIS — R51 Headache: Secondary | ICD-10-CM

## 2015-11-15 DIAGNOSIS — Z9221 Personal history of antineoplastic chemotherapy: Secondary | ICD-10-CM | POA: Diagnosis not present

## 2015-11-15 DIAGNOSIS — R5383 Other fatigue: Secondary | ICD-10-CM

## 2015-11-15 DIAGNOSIS — C787 Secondary malignant neoplasm of liver and intrahepatic bile duct: Secondary | ICD-10-CM | POA: Diagnosis not present

## 2015-11-15 DIAGNOSIS — R634 Abnormal weight loss: Secondary | ICD-10-CM

## 2015-11-15 DIAGNOSIS — R630 Anorexia: Secondary | ICD-10-CM

## 2015-11-15 DIAGNOSIS — M549 Dorsalgia, unspecified: Secondary | ICD-10-CM

## 2015-11-15 DIAGNOSIS — R112 Nausea with vomiting, unspecified: Secondary | ICD-10-CM

## 2015-11-15 DIAGNOSIS — C185 Malignant neoplasm of splenic flexure: Secondary | ICD-10-CM | POA: Diagnosis not present

## 2015-11-15 DIAGNOSIS — H539 Unspecified visual disturbance: Secondary | ICD-10-CM

## 2015-11-15 DIAGNOSIS — R5381 Other malaise: Secondary | ICD-10-CM

## 2015-11-15 LAB — CBC WITH DIFFERENTIAL/PLATELET
BASOS ABS: 0 10*3/uL (ref 0–0.1)
BASOS PCT: 0 %
Eosinophils Absolute: 0 10*3/uL (ref 0–0.7)
Eosinophils Relative: 0 %
HEMATOCRIT: 31.2 % — AB (ref 35.0–47.0)
Hemoglobin: 10.6 g/dL — ABNORMAL LOW (ref 12.0–16.0)
Lymphocytes Relative: 24 %
Lymphs Abs: 1.7 10*3/uL (ref 1.0–3.6)
MCH: 28.5 pg (ref 26.0–34.0)
MCHC: 33.9 g/dL (ref 32.0–36.0)
MCV: 84.2 fL (ref 80.0–100.0)
MONO ABS: 0.9 10*3/uL (ref 0.2–0.9)
Monocytes Relative: 12 %
NEUTROS ABS: 4.6 10*3/uL (ref 1.4–6.5)
Neutrophils Relative %: 64 %
PLATELETS: 272 10*3/uL (ref 150–440)
RBC: 3.71 MIL/uL — ABNORMAL LOW (ref 3.80–5.20)
RDW: 25.8 % — AB (ref 11.5–14.5)
WBC: 7.1 10*3/uL (ref 3.6–11.0)

## 2015-11-15 MED ORDER — HEPARIN SOD (PORK) LOCK FLUSH 100 UNIT/ML IV SOLN
500.0000 [IU] | Freq: Once | INTRAVENOUS | Status: AC
  Administered 2015-11-15: 500 [IU] via INTRAVENOUS

## 2015-11-15 MED ORDER — PANTOPRAZOLE SODIUM 40 MG IV SOLR
40.0000 mg | Freq: Once | INTRAVENOUS | Status: AC
  Administered 2015-11-15: 40 mg via INTRAVENOUS
  Filled 2015-11-15: qty 40

## 2015-11-15 MED ORDER — PANTOPRAZOLE SODIUM 40 MG IV SOLR
40.0000 mg | Freq: Once | INTRAVENOUS | Status: AC
  Filled 2015-11-15: qty 40

## 2015-11-15 MED ORDER — SODIUM CHLORIDE 0.9 % IV SOLN
Freq: Once | INTRAVENOUS | Status: AC
  Administered 2015-11-15: 12:00:00 via INTRAVENOUS
  Filled 2015-11-15: qty 4

## 2015-11-15 MED ORDER — SODIUM CHLORIDE 0.9 % IV SOLN
Freq: Once | INTRAVENOUS | Status: AC
  Filled 2015-11-15: qty 4

## 2015-11-15 MED ORDER — SODIUM CHLORIDE 0.9 % IV SOLN
Freq: Once | INTRAVENOUS | Status: AC
  Administered 2015-11-15: 11:00:00 via INTRAVENOUS
  Filled 2015-11-15: qty 1000

## 2015-11-15 MED ORDER — SODIUM CHLORIDE 0.9 % IV SOLN
Freq: Once | INTRAVENOUS | Status: AC
  Filled 2015-11-15: qty 1000

## 2015-11-15 MED ORDER — HEPARIN SOD (PORK) LOCK FLUSH 100 UNIT/ML IV SOLN
INTRAVENOUS | Status: AC
  Filled 2015-11-15: qty 5

## 2015-11-15 MED ORDER — SODIUM CHLORIDE 0.9 % IJ SOLN
10.0000 mL | INTRAMUSCULAR | Status: DC | PRN
  Administered 2015-11-15: 10 mL via INTRAVENOUS
  Filled 2015-11-15: qty 10

## 2015-11-15 NOTE — Progress Notes (Signed)
[place]  [phone]  [phone]     Misty Mack DOB: [birth year]  MR#: [id number]  FVC#:[id number]  Patient Care Team: Lynnell Jude, MD as PCP - General (Family Medicine)  CHIEF COMPLAINT:  Chief Complaint  Patient presents with  . Acute Add on    INTERVAL HISTORY:  Patient is here as an acute add on regarding epigastric discomfort, cramping, nausea, vomiting of dark emesis. She was recently seen in clinic for increasing back pain on January 16, patient was taking ibuprofen for pain. She was advised to discontinue ibuprofen, and start tramadol and low-dose prednisone taper. He was also started on Prilosec on January 16. She reports having been able to tolerate oral intake of 2 bites toast since yesterday. She describes epigastric pain as being cramping and burning.  REVIEW OF SYSTEMS:   Review of Systems  Constitutional: Positive for malaise/fatigue. Negative for fever, chills, weight loss and diaphoresis.  HENT: Negative.   Eyes: Negative.   Respiratory: Negative for cough, hemoptysis, sputum production, shortness of breath and wheezing.   Cardiovascular: Negative for chest pain, palpitations, orthopnea, claudication, leg swelling and PND.  Gastrointestinal: Positive for nausea, vomiting and abdominal pain. Negative for heartburn, diarrhea, constipation, blood in stool and melena.       Dark colored vomitus  Genitourinary: Negative.   Musculoskeletal: Negative.   Skin: Negative.   Neurological: Negative for dizziness, tingling, focal weakness, seizures and weakness.  Endo/Heme/Allergies: Does not bruise/bleed easily.  Psychiatric/Behavioral: Negative for depression. The patient is not nervous/anxious and does not have insomnia.     As per HPI. Otherwise, a complete review of systems is negatve.  ONCOLOGY HISTORY: Oncology History   1. Carcinoma of the colon, splenic  flexure.   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 2. K-RAS mutated. 3. Patient started on FOLFOX and Avastin 4 th June, 2015 4. Allergic reaction  characterized by generalized itching and redness  of face and palm on June 15, 2014 5. Started on FOLFIRI  and avastin   July 13, 2014 6. Finished 12 cycles of chemotherapy, 5-FU based FOLFOX was changed to FOLFIRI BECAUSE OF SENSITIVITY TO OXALIPLATIN(September 07, 2014) 7. Started on maintenance chemotherapy with 5-FU leucovorin and Avastin from September 28, 2014 8.  Progressive disease based on tumor markers as well as PET scan's so patient was started on FOLFOX chemotherapy with desensitizing  Protocol. 9. Progressing disease by CT scan and by symptoms. Patient will be started on TAS-102 from October, 2016     Colon cancer [facility])   02/10/2014 Initial Diagnosis Colon cancer    PAST MEDICAL HISTORY: Past Medical History  Diagnosis Date  . Ulcer   . Anemia   . Obesity   . Constipation   . Migraines   . Cancer ([place]) 2015    colon  . Colon cancer metastasized to liver [facility]) April 2015    T3, N2, M1, stage IV with biopsy-proven liver metastases. On adjuvant chemotherapy    PAST SURGICAL HISTORY: Past Surgical History  Procedure Laterality Date  . Cholecystectomy  2014  . Tubal ligation  2005  . Colonoscopy  2015  . Upper gi endoscopy  2015    FAMILY HISTORY No family history on file.  GYNECOLOGIC HISTORY:  No LMP recorded.     ADVANCED DIRECTIVES:    HEALTH MAINTENANCE: Social History  Substance Use Topics  .  Smoking status: Never Smoker   . Smokeless tobacco: Not on file  . Alcohol Use: No     Colonoscopy:  PAP:  Bone density:  Lipid panel:  Allergies  Allergen Reactions  . Fentanyl Swelling    Swelling of lips  . Oxaliplatin Itching  . Hydrocodone-Acetaminophen Hives    Current Outpatient Prescriptions  Medication Sig Dispense Refill  . acetaminophen  (TYLENOL) 500 MG tablet Take 500 mg by mouth every 6 (six) hours as needed.    . diphenoxylate-atropine (LOMOTIL) 2.5-0.025 MG per tablet Take 1 tablet by mouth 4 (four) times daily as needed for diarrhea or loose stools (2 tabs to start, then one by mouth every 6 hours if needed for diarrhea.). 30 tablet 0  . fentaNYL (DURAGESIC - DOSED MCG/HR) 12 MCG/HR PLACE 1 PATCH ONTO THE SKIN EVERY 3 DAYS  0  . ferrous fumarate-iron polysaccharide complex (TANDEM) 162-115.2 MG CAPS Take 1 capsule by mouth 2 (two) times daily.    Marland Kitchen gabapentin (NEURONTIN) 300 MG capsule Take 300 mg by mouth 2 (two) times daily.    Marland Kitchen HYDROcodone-acetaminophen (NORCO/VICODIN) 5-325 MG tablet TAKE 1 TABLET BY MOUTH EVERY 6 HOURS AS NEEDED FOR MODERATE PAIN  0  . ibuprofen (ADVIL,MOTRIN) 800 MG tablet Take 800 mg by mouth 3 (three) times daily.    Marland Kitchen ibuprofen (ADVIL,MOTRIN) 800 MG tablet Take 800 mg by mouth.    . lidocaine (LIDODERM) 5 % Place 1 patch onto the skin daily. Remove & Discard patch within 12 hours or as directed by MD    . LONSURF 15-6.14 MG tablet     . LONSURF 20-8.19 MG tablet     . methocarbamol (ROBAXIN) 750 MG tablet Take 750 mg by mouth 2 (two) times daily. 2 tabs    . omeprazole (PRILOSEC) 20 MG capsule Take 1 capsule (20 mg total) by mouth 2 (two) times daily before a meal. 60 capsule 5  . ondansetron (ZOFRAN-ODT) 4 MG disintegrating tablet Take 1 tablet (4 mg total) by mouth 4 (four) times daily as needed for nausea or vomiting. 30 tablet 0  . pantoprazole (PROTONIX) 40 MG tablet Take 40 mg by mouth 2 (two) times daily.    . polyethylene glycol (MIRALAX / GLYCOLAX) packet Take 17 g by mouth daily.    . potassium chloride (KLOR-CON) 20 MEQ packet Take 20 mEq by mouth daily. 30 packet 3  . potassium chloride SA (K-DUR,KLOR-CON) 20 MEQ tablet Take 1 tablet (20 mEq total) by mouth daily. 30 tablet 3  . predniSONE (DELTASONE) 50 MG tablet     . predniSONE (STERAPRED UNI-PAK 21 TAB) 10 MG (21) TBPK tablet Take  60 mg po day 1, 50 mg po day 2, 40 mg po day 3, 30 mg po day 4, 20 mg po day 5, 10 mg po day 6 21 tablet 0  . promethazine (PHENERGAN) 25 MG suppository Place 1 suppository (25 mg total) rectally every 6 (six) hours as needed for nausea or vomiting. 6 each 0  . sulfamethoxazole-trimethoprim (BACTRIM DS,SEPTRA DS) 800-160 MG per tablet Take 1 tablet by mouth 2 (two) times daily.    . traMADol (ULTRAM) 50 MG tablet Take 1 tablet (50 mg total) by mouth every morning. Take with tylenol 325 mg.tablet 15 tablet 1  . valACYclovir (VALTREX) 1000 MG tablet Take 1,000 mg by mouth 2 (two) times daily.     Current Facility-Administered Medications  Medication Dose Route Frequency Provider Last Rate Last Dose  . ondansetron (ZOFRAN) 8 mg  in sodium chloride 0.9 % 50 mL IVPB   Intravenous Once Evlyn Kanner, NP      . pantoprazole (PROTONIX) injection 40 mg  40 mg Intravenous Once Evlyn Kanner, NP      . sodium chloride 0.9 % 1,000 mL infusion   Intravenous Once Evlyn Kanner, NP       Facility-Administered Medications Ordered in Other Visits  Medication Dose Route Frequency Provider Last Rate Last Dose  . 0.9 %  sodium chloride infusion   Intravenous Once Evlyn Kanner, NP      . heparin lock flush 100 unit/mL  500 Units Intravenous Once Evlyn Kanner, NP      . ondansetron (ZOFRAN) 8 mg in sodium chloride 0.9 % 50 mL IVPB   Intravenous Once Evlyn Kanner, NP      . pantoprazole (PROTONIX) injection 40 mg  40 mg Intravenous Once Evlyn Kanner, NP      . sodium chloride 0.9 % injection 10 mL  10 mL Intracatheter PRN [name], MD   10 mL at 03/21/15 1025  . sodium chloride 0.9 % injection 10 mL  10 mL Intravenous PRN [name], MD   10 mL at 04/21/15 1025  . sodium chloride 0.9 % injection 10 mL  10 mL Intravenous PRN Evlyn Kanner, NP        OBJECTIVE: BP 119/68 mmHg  Pulse 95  Temp(Src) 97.2 F (36.2 C) (Tympanic)  Resp 18  Wt 166 lb 7 oz (75.496 kg)   Body mass index  is 29.49 kg/(m^2).    ECOG FS:1 - Symptomatic but completely ambulatory  General: Well-developed, well-nourished, no acute distress. Eyes: Pink conjunctiva, anicteric sclera. HEENT: Dry mucous membranes  Lungs: Clear to auscultation bilaterally. Heart: Regular rate and rhythm. No rubs, murmurs, or gallops. Abdomen: Soft, nontender, nondistended. No organomegaly noted, normoactive bowel sounds. Neuro: Alert, answering all questions appropriately. Cranial nerves grossly intact. Skin: No rashes or petechiae noted. Psych: Normal affect. Lymphatics: No cervical, calvicular, axillary or inguinal LAD.   LAB RESULTS:  No visits with results within 3 Day(s) from this visit. Latest known visit with results is:  Appointment on 11/12/2015  Component Date Value Ref Range Status  . WBC 11/12/2015 3.6  3.6 - 11.0 K/uL Final  . RBC 11/12/2015 3.64* 3.80 - 5.20 MIL/uL Final  . Hemoglobin 11/12/2015 10.4* 12.0 - 16.0 g/dL Final  . HCT 11/12/2015 30.3* 35.0 - 47.0 % Final  . MCV 11/12/2015 83.4  80.0 - 100.0 fL Final  . MCH 11/12/2015 28.7  26.0 - 34.0 pg Final  . MCHC 11/12/2015 34.4  32.0 - 36.0 g/dL Final  . RDW 11/12/2015 25.3* 11.5 - 14.5 % Final  . Platelets 11/12/2015 264  150 - 440 K/uL Final  . Neutrophils Relative % 11/12/2015 51   Final  . Neutro Abs 11/12/2015 1.8  1.4 - 6.5 K/uL Final  . Lymphocytes Relative 11/12/2015 37   Final  . Lymphs Abs 11/12/2015 1.3  1.0 - 3.6 K/uL Final  . Monocytes Relative 11/12/2015 12   Final  . Monocytes Absolute 11/12/2015 0.4  0.2 - 0.9 K/uL Final  . Eosinophils Relative 11/12/2015 0   Final  . Eosinophils Absolute 11/12/2015 0.0  0 - 0.7 K/uL Final  . Basophils Relative 11/12/2015 0   Final  . Basophils Absolute 11/12/2015 0.0  0 - 0.1 K/uL Final  . Sodium 11/12/2015 137  135 - 145 mmol/L Final  . Potassium 11/12/2015 3.2*  3.5 - 5.1 mmol/L Final  . Chloride 11/12/2015 104  101 - 111 mmol/L Final  . CO2 11/12/2015 27  22 - 32 mmol/L Final  .  Glucose, Bld 11/12/2015 105* 65 - 99 mg/dL Final  . BUN 11/12/2015 <5* 6 - 20 mg/dL Final  . Creatinine, Ser 11/12/2015 0.54  0.44 - 1.00 mg/dL Final  . Calcium 11/12/2015 9.4  8.9 - 10.3 mg/dL Final  . Total Protein 11/12/2015 7.1  6.5 - 8.1 g/dL Final  . Albumin 11/12/2015 3.9  3.5 - 5.0 g/dL Final  . AST 11/12/2015 18  15 - 41 U/L Final  . ALT 11/12/2015 14  14 - 54 U/L Final  . Alkaline Phosphatase 11/12/2015 56  38 - 126 U/L Final  . Total Bilirubin 11/12/2015 0.7  0.3 - 1.2 mg/dL Final  . GFR calc non Af Amer 11/12/2015 >60  >60 mL/min Final  . GFR calc Af Amer 11/12/2015 >60  >60 mL/min Final   Comment: (NOTE) The eGFR has been calculated using the CKD EPI equation. This calculation has not been validated in all clinical situations. eGFR's persistently <60 mL/min signify possible Chronic Kidney Disease.   . Anion gap 11/12/2015 6  5 - 15 Final  . CEA 11/12/2015 6.2* 0.0 - 4.7 ng/mL Final   Comment: (NOTE)       Roche ECLIA methodology       Nonsmokers  <3.9                                     Smokers     <5.6 Performed At: [facility] [place], [place] [id number] Lindon Romp MD WU:[id number]     STUDIES: No results found.  ASSESSMENT:  Epigastric pain. Carcinoma of colon, stage IV disease, T3 N1 M1 B.  PLAN:   1. Epigastric pain. Patient having dark-colored emesis, epigastric cramping and burning sensation that is concerning for ulcer. She was last seen in office on January 16 for increasing back pain, she had been using ibuprofen frequently at home. Ibuprofen was stopped on the 16th and Dr. Oliva Bustard started her on tramadol 50 mg 4 times a day as needed as well as short cycle of prednisone. Patient has an MRI scheduled for February 2 for further evaluation of back pain. Will administer 1 L of IV fluids today with 8 mg of Zofran as well as 40 mg of Protonix IV. We'll also increase her by mouth omeprazole to 40 mg twice daily. Discussed with Dr.  Jamal Collin who is going to review her chart. Possibility of upper GI scheduled as an outpatient depending on results of CBC, response to IV fluids and medications in clinic today, as well as Dr. Angie Fava input. 2. Colon cancer. Tumor markers are steadily rising, CEA on December 5th 6.8. TAS-102 has been held. Patient to see Dr. Oliva Bustard to discuss continuation of therapy.  After discussion with Dr. Jamal Collin, he would like to see patient in his office tomorrow 11/16/2015. Pending her CBC shows hemodynamic stability patient will follow up with Dr. Jamal Collin and again with Dr. Oliva Bustard in 1 week.  Patient expressed understanding and was in agreement with this plan. She also understands that She can call clinic at any time with any questions, concerns, or complaints.   Dr. Oliva Bustard was available for consultation and review of plan of care for this patient.  Colon cancer [facility])   Staging form: Colon  and Rectum, AJCC 7th Edition     Clinical: Stage IVB (yT3, N1, M1b) - Signed by [name], MD on 03/07/2015   Evlyn Kanner, NP   11/15/2015 11:10 AM

## 2015-11-15 NOTE — Telephone Encounter (Signed)
appt for 1015 given and greed to after discussing with Dr Oliva Bustard

## 2015-11-15 NOTE — Progress Notes (Signed)
Patient acute add on for n/v since early this morning. States abdomen and back are hurting 7/10.

## 2015-11-15 NOTE — Telephone Encounter (Signed)
The new med ordered is not helping her pain and it is worse. She states she got "sick" last night and is asking to be seen today

## 2015-11-16 ENCOUNTER — Encounter: Payer: Self-pay | Admitting: General Surgery

## 2015-11-16 ENCOUNTER — Ambulatory Visit (INDEPENDENT_AMBULATORY_CARE_PROVIDER_SITE_OTHER): Payer: 59 | Admitting: General Surgery

## 2015-11-16 VITALS — BP 130/74 | HR 76 | Resp 12 | Ht 66.0 in | Wt 165.0 lb

## 2015-11-16 DIAGNOSIS — C787 Secondary malignant neoplasm of liver and intrahepatic bile duct: Secondary | ICD-10-CM | POA: Diagnosis not present

## 2015-11-16 DIAGNOSIS — C189 Malignant neoplasm of colon, unspecified: Secondary | ICD-10-CM

## 2015-11-16 DIAGNOSIS — R110 Nausea: Secondary | ICD-10-CM

## 2015-11-16 DIAGNOSIS — K920 Hematemesis: Secondary | ICD-10-CM

## 2015-11-16 NOTE — Progress Notes (Signed)
Patient ID: Misty Mack, female   DOB: 1971/07/20, 45 y.o.   MRN: [id number]  Chief Complaint  Patient presents with  . Abdominal Pain    HPI JOETTE Mack is a 45 y.o. female here today for a evaluation of vomiting coffee ground material, left lower quadrant pain radiating to the left lower back, she states this has been going on for about 1 week now. She  had several episodes of nausea and vomiting with coffee-ground material 2 days ago. The pain stay for 30 to 45 min. She has been taking 600- 800mg  ibuprofen for the past month for back pain. Started on steroid taper 2-3 days ago and discontinued yesterday. Ct scan done on 10/19/2015. MRI spine scheduled for 11/29/2015. HPI I have reviewed the history of present illness with the patient.  Past Medical History  Diagnosis Date  . Ulcer   . Anemia   . Obesity   . Constipation   . Migraines   . Cancer ([place]) 2015    colon  . Colon cancer metastasized to liver [facility]) April 2015    T3, N2, M1, stage IV with biopsy-proven liver metastases. On adjuvant chemotherapy    Past Surgical History  Procedure Laterality Date  . Cholecystectomy  2014  . Tubal ligation  2005  . Colonoscopy  2015  . Upper gi endoscopy  2015    History reviewed. No pertinent family history.  Social History Social History  Substance Use Topics  . Smoking status: Never Smoker   . Smokeless tobacco: None  . Alcohol Use: No    Allergies  Allergen Reactions  . Fentanyl Swelling    Swelling of lips  . Oxaliplatin Itching  . Hydrocodone-Acetaminophen Hives    Current Outpatient Prescriptions  Medication Sig Dispense Refill  . acetaminophen (TYLENOL) 500 MG tablet Take 500 mg by mouth every 6 (six) hours as needed.    . lidocaine (LIDODERM) 5 % Place 1 patch onto the skin daily. Remove & Discard patch within 12 hours or as directed by MD    . omeprazole (PRILOSEC) 20 MG capsule Take 1 capsule (20 mg total) by mouth 2 (two) times daily before a meal. 60  capsule 5  . polyethylene glycol (MIRALAX / GLYCOLAX) packet Take 17 g by mouth daily.    . promethazine (PHENERGAN) 25 MG suppository Place 1 suppository (25 mg total) rectally every 6 (six) hours as needed for nausea or vomiting. 6 each 0  . traMADol (ULTRAM) 50 MG tablet Take 1 tablet (50 mg total) by mouth every morning. Take with tylenol 325 mg.tablet 15 tablet 1  . valACYclovir (VALTREX) 1000 MG tablet Take 1,000 mg by mouth 2 (two) times daily.     No current facility-administered medications for this visit.   Facility-Administered Medications Ordered in Other Visits  Medication Dose Route Frequency Provider Last Rate Last Dose  . sodium chloride 0.9 % injection 10 mL  10 mL Intracatheter PRN [name], MD   10 mL at 03/21/15 1025  . sodium chloride 0.9 % injection 10 mL  10 mL Intravenous PRN [name], MD   10 mL at 04/21/15 1025    Review of Systems Review of Systems  Constitutional: Negative.   Respiratory: Negative.   Cardiovascular: Negative.   Gastrointestinal: Positive for abdominal pain ( left lower quadrant).    Blood pressure 130/74, pulse 76, resp. rate 12, height 5\' 6"  (1.676 m), weight 165 lb (74.844 kg).  Physical Exam Physical Exam  Constitutional: She is  oriented to person, place, and time. She appears well-developed and well-nourished.  Eyes: Conjunctivae are normal.  Neck: Neck supple.  Cardiovascular: Normal rate, regular rhythm and normal heart sounds.   Pulmonary/Chest: Effort normal and breath sounds normal.  Abdominal: Soft. Normal appearance and bowel sounds are normal. There is no hepatomegaly. There is no tenderness.  Neurological: She is alert and oriented to person, place, and time.  Skin: Skin is warm and dry.    Data Reviewed Notes and CT scan reviewed.  Labs - hgb has been stable around 10.  Assessment      Hematemesis. Stage 4 colon CA.  Dr. Oliva Bustard, her oncologist, has requested an endoscopy - look for potential bleeding source.  Pt has not vomited after last night. Discussed fully with pt and she is agreeable to an endoscopy  Plan    Pt to be scheduled for an upper endoscopy.      Patient is scheduled for an upper endoscopy at [facility] on 11/19/15. She will have nothing to eat after midnight and may have clear liquids up till 8 am the morning of procedure. Patient is aware of date and instructions.   PCP:  Lynnell Jude This information has been scribed by Gaspar Cola CMA.   Alfredia Desanctis G 11/16/2015, 12:21 PM

## 2015-11-16 NOTE — Patient Instructions (Addendum)
Patient to have upper endoscopy.  Patient is scheduled for an upper endoscopy at [facility] on 11/19/15. She will have nothing to eat after midnight and may have clear liquids up till 8 am the morning of procedure. Patient is aware of date and instructions.

## 2015-11-19 ENCOUNTER — Ambulatory Visit
Admission: RE | Admit: 2015-11-19 | Discharge: 2015-11-19 | Disposition: A | Discharge status: Home / Self Care | Payer: 59 | Source: Ambulatory Visit | Attending: General Surgery | Admitting: General Surgery

## 2015-11-19 ENCOUNTER — Encounter
Admission: RE | Disposition: A | Discharge status: Home / Self Care | Payer: Self-pay | Source: Ambulatory Visit | Attending: General Surgery

## 2015-11-19 ENCOUNTER — Ambulatory Visit: Payer: 59 | Admitting: Anesthesiology

## 2015-11-19 ENCOUNTER — Encounter: Payer: Self-pay | Admitting: *Deleted

## 2015-11-19 DIAGNOSIS — R109 Unspecified abdominal pain: Secondary | ICD-10-CM | POA: Diagnosis not present

## 2015-11-19 DIAGNOSIS — Z79899 Other long term (current) drug therapy: Secondary | ICD-10-CM | POA: Diagnosis not present

## 2015-11-19 DIAGNOSIS — K920 Hematemesis: Secondary | ICD-10-CM | POA: Diagnosis not present

## 2015-11-19 DIAGNOSIS — Z885 Allergy status to narcotic agent status: Secondary | ICD-10-CM | POA: Insufficient documentation

## 2015-11-19 DIAGNOSIS — C787 Secondary malignant neoplasm of liver and intrahepatic bile duct: Secondary | ICD-10-CM | POA: Insufficient documentation

## 2015-11-19 DIAGNOSIS — C189 Malignant neoplasm of colon, unspecified: Secondary | ICD-10-CM | POA: Insufficient documentation

## 2015-11-19 DIAGNOSIS — Z6828 Body mass index (BMI) 28.0-28.9, adult: Secondary | ICD-10-CM | POA: Insufficient documentation

## 2015-11-19 DIAGNOSIS — K319 Disease of stomach and duodenum, unspecified: Secondary | ICD-10-CM | POA: Diagnosis not present

## 2015-11-19 DIAGNOSIS — E669 Obesity, unspecified: Secondary | ICD-10-CM | POA: Diagnosis not present

## 2015-11-19 DIAGNOSIS — Z9851 Tubal ligation status: Secondary | ICD-10-CM | POA: Diagnosis not present

## 2015-11-19 DIAGNOSIS — K3189 Other diseases of stomach and duodenum: Secondary | ICD-10-CM | POA: Diagnosis not present

## 2015-11-19 DIAGNOSIS — K2960 Other gastritis without bleeding: Secondary | ICD-10-CM | POA: Diagnosis not present

## 2015-11-19 DIAGNOSIS — Z888 Allergy status to other drugs, medicaments and biological substances status: Secondary | ICD-10-CM | POA: Diagnosis not present

## 2015-11-19 DIAGNOSIS — Z9049 Acquired absence of other specified parts of digestive tract: Secondary | ICD-10-CM | POA: Insufficient documentation

## 2015-11-19 HISTORY — PX: ESOPHAGOGASTRODUODENOSCOPY (EGD) WITH PROPOFOL: SHX5813

## 2015-11-19 LAB — POCT PREGNANCY, URINE: Preg Test, Ur: NEGATIVE

## 2015-11-19 SURGERY — ESOPHAGOGASTRODUODENOSCOPY (EGD) WITH PROPOFOL
Anesthesia: General

## 2015-11-19 MED ORDER — SUCCINYLCHOLINE CHLORIDE 20 MG/ML IJ SOLN
INTRAMUSCULAR | Status: DC | PRN
  Administered 2015-11-19: 100 mg via INTRAVENOUS

## 2015-11-19 MED ORDER — PROPOFOL 10 MG/ML IV BOLUS
INTRAVENOUS | Status: DC | PRN
  Administered 2015-11-19: 120 mg via INTRAVENOUS
  Administered 2015-11-19: 50 mg via INTRAVENOUS

## 2015-11-19 MED ORDER — PROPOFOL 500 MG/50ML IV EMUL
INTRAVENOUS | Status: DC | PRN
  Administered 2015-11-19: 50 ug/kg/min via INTRAVENOUS

## 2015-11-19 MED ORDER — ONDANSETRON HCL 4 MG/2ML IJ SOLN: 4.0000 mg | Freq: Once | INTRAMUSCULAR | Status: DC | PRN

## 2015-11-19 MED ORDER — MIDAZOLAM HCL 5 MG/5ML IJ SOLN
INTRAMUSCULAR | Status: DC | PRN
  Administered 2015-11-19: 1 mg via INTRAVENOUS

## 2015-11-19 MED ORDER — SODIUM CHLORIDE 0.9 % IV SOLN
INTRAVENOUS | Status: DC
  Administered 2015-11-19 (×2): via INTRAVENOUS

## 2015-11-19 MED ORDER — ONDANSETRON HCL 4 MG/2ML IJ SOLN
INTRAMUSCULAR | Status: DC | PRN
  Administered 2015-11-19: 4 mg via INTRAVENOUS

## 2015-11-19 MED ORDER — FENTANYL CITRATE (PF) 100 MCG/2ML IJ SOLN: 25.0000 ug | INTRAMUSCULAR | Status: DC | PRN

## 2015-11-19 MED ORDER — ESMOLOL HCL 100 MG/10ML IV SOLN
INTRAVENOUS | Status: DC | PRN
  Administered 2015-11-19: 20 ug via INTRAVENOUS
  Administered 2015-11-19: 30 ug via INTRAVENOUS

## 2015-11-19 MED ORDER — IPRATROPIUM-ALBUTEROL 20-100 MCG/ACT IN AERS
INHALATION_SPRAY | RESPIRATORY_TRACT | Status: DC | PRN
  Administered 2015-11-19: 4 via RESPIRATORY_TRACT

## 2015-11-19 MED ORDER — LIDOCAINE HCL (PF) 2 % IJ SOLN
INTRAMUSCULAR | Status: DC | PRN
  Administered 2015-11-19: 60 mg

## 2015-11-19 MED ORDER — METOPROLOL TARTRATE 1 MG/ML IV SOLN
INTRAVENOUS | Status: DC | PRN
  Administered 2015-11-19 (×2): 1 mg via INTRAVENOUS

## 2015-11-19 NOTE — Anesthesia Preprocedure Evaluation (Signed)
Anesthesia Evaluation    Airway Mallampati: II  TM Distance: >3 FB     Dental  (+) Chipped   Pulmonary    Pulmonary exam normal breath sounds clear to auscultation       Cardiovascular Normal cardiovascular exam     Neuro/Psych    GI/Hepatic Liver mets Colon CA and pancreatic mass   Endo/Other  negative endocrine ROS  Renal/GU negative Renal ROS     Musculoskeletal negative musculoskeletal ROS (+)   Abdominal Normal abdominal exam  (+)   Peds  Hematology  (+) anemia ,   Anesthesia Other Findings   Reproductive/Obstetrics                             Anesthesia Physical Anesthesia Plan  ASA: III  Anesthesia Plan: General   Post-op Pain Management:    Induction: Intravenous  Airway Management Planned: Nasal Cannula  Additional Equipment:   Intra-op Plan:   Post-operative Plan:   Informed Consent: I have reviewed the patients History and Physical, chart, labs and discussed the procedure including the risks, benefits and alternatives for the proposed anesthesia with the patient or authorized representative who has indicated his/her understanding and acceptance.   Dental advisory given  Plan Discussed with: CRNA and Surgeon  Anesthesia Plan Comments:         Anesthesia Quick Evaluation

## 2015-11-19 NOTE — Op Note (Signed)
[facility] Gastroenterology Patient Name: Misty Mack Procedure Date: 11/19/2015 3:16 PM MRN: [id number] Account #: [id number] Date of Birth: 1971-03-02 Admit Type: Outpatient Age: 45 Room: [facility] ENDO ROOM 1 Gender: Female Note Status: Finalized Procedure:         Upper GI endoscopy Indications:       Coffee-ground emesis Providers:         Graclyn Lawther G. Jamal Collin, MD Referring MD:      Reyes Ivan, MD (Referring MD) Medicines:         General Anesthesia Complications:     No immediate complications. Procedure:         Pre-Anesthesia Assessment:                    - General anesthesia under the supervision of an                     anesthesiologist was determined to be medically necessary                     for this procedure based on review of the patient's                     medical history, medications, and prior anesthesia history.                    After obtaining informed consent, the endoscope was passed                     under direct vision. Throughout the procedure, the                     patient's blood pressure, pulse, and oxygen saturations                     were monitored continuously. The Endoscope was introduced                     through the mouth, and advanced to the second part of                     duodenum. The upper GI endoscopy was accomplished without                     difficulty. The upper GI endoscopy was accomplished                     without difficulty. The patient tolerated the procedure                     fairly well. Findings:      The esophagus was normal.      The examined duodenum was normal.      Patchy moderately erythematous mucosa without bleeding was found in the       gastric antrum.      Patchy nodular mucosa was found in the gastric fundus. Biopsies were       taken with a cold forceps for histology. Biopsies were taken with a cold       forceps for histology.      The exam was otherwise without  abnormality. Impression:        - Normal esophagus.                    - Normal examined duodenum.                    -  Erythematous mucosa in the antrum.                    - Nodular mucosa in the gastric fundus. Biopsied.                    - The examination was otherwise normal. Recommendation:    - Await pathology results. Procedure Code(s): --- Professional ---                    [phone], Esophagogastroduodenoscopy, flexible, transoral;                     with biopsy, single or multiple Diagnosis Code(s): --- Professional ---                    K31.89, Other diseases of stomach and duodenum                    K92.0, Hematemesis CPT copyright 2014 American Medical Association. All rights reserved. The codes documented in this report are preliminary and upon coder review may  be revised to meet current compliance requirements. Christene Lye, MD 11/19/2015 3:48:47 PM This report has been signed electronically. Number of Addenda: 0 Note Initiated On: 11/19/2015 3:16 PM      [facility]

## 2015-11-19 NOTE — H&P (View-Only) (Signed)
Patient ID: Misty Mack, female   DOB: [birth year], 45 y.o.   MRN: [id number]  Chief Complaint  Patient presents with  . Abdominal Pain    HPI Misty Mack is a 45 y.o. female here today for a evaluation of vomiting coffee ground material, left lower quadrant pain radiating to the left lower back, she states this has been going on for about 1 week now. She  had several episodes of nausea and vomiting with coffee-ground material 2 days ago. The pain stay for 30 to 45 min. She has been taking 600- 800mg  ibuprofen for the past month for back pain. Started on steroid taper 2-3 days ago and discontinued yesterday. Ct scan done on 10/19/2015. MRI spine scheduled for 11/29/2015. HPI I have reviewed the history of present illness with the patient.  Past Medical History  Diagnosis Date  . Ulcer   . Anemia   . Obesity   . Constipation   . Migraines   . Cancer ([place]) 2015    colon  . Colon cancer metastasized to liver [facility]) April 2015    T3, N2, M1, stage IV with biopsy-proven liver metastases. On adjuvant chemotherapy    Past Surgical History  Procedure Laterality Date  . Cholecystectomy  2014  . Tubal ligation  2005  . Colonoscopy  2015  . Upper gi endoscopy  2015    History reviewed. No pertinent family history.  Social History Social History  Substance Use Topics  . Smoking status: Never Smoker   . Smokeless tobacco: None  . Alcohol Use: No    Allergies  Allergen Reactions  . Fentanyl Swelling    Swelling of lips  . Oxaliplatin Itching  . Hydrocodone-Acetaminophen Hives    Current Outpatient Prescriptions  Medication Sig Dispense Refill  . acetaminophen (TYLENOL) 500 MG tablet Take 500 mg by mouth every 6 (six) hours as needed.    . lidocaine (LIDODERM) 5 % Place 1 patch onto the skin daily. Remove & Discard patch within 12 hours or as directed by MD    . omeprazole (PRILOSEC) 20 MG capsule Take 1 capsule (20 mg total) by mouth 2 (two) times daily before a meal. 60  capsule 5  . polyethylene glycol (MIRALAX / GLYCOLAX) packet Take 17 g by mouth daily.    . promethazine (PHENERGAN) 25 MG suppository Place 1 suppository (25 mg total) rectally every 6 (six) hours as needed for nausea or vomiting. 6 each 0  . traMADol (ULTRAM) 50 MG tablet Take 1 tablet (50 mg total) by mouth every morning. Take with tylenol 325 mg.tablet 15 tablet 1  . valACYclovir (VALTREX) 1000 MG tablet Take 1,000 mg by mouth 2 (two) times daily.     No current facility-administered medications for this visit.   Facility-Administered Medications Ordered in Other Visits  Medication Dose Route Frequency Provider Last Rate Last Dose  . sodium chloride 0.9 % injection 10 mL  10 mL Intracatheter PRN [name], MD   10 mL at 03/21/15 1025  . sodium chloride 0.9 % injection 10 mL  10 mL Intravenous PRN [name], MD   10 mL at 04/21/15 1025    Review of Systems Review of Systems  Constitutional: Negative.   Respiratory: Negative.   Cardiovascular: Negative.   Gastrointestinal: Positive for abdominal pain ( left lower quadrant).    Blood pressure 130/74, pulse 76, resp. rate 12, height 5\' 6"  (1.676 m), weight 165 lb (74.844 kg).  Physical Exam Physical Exam  Constitutional: She is  oriented to person, place, and time. She appears well-developed and well-nourished.  Eyes: Conjunctivae are normal.  Neck: Neck supple.  Cardiovascular: Normal rate, regular rhythm and normal heart sounds.   Pulmonary/Chest: Effort normal and breath sounds normal.  Abdominal: Soft. Normal appearance and bowel sounds are normal. There is no hepatomegaly. There is no tenderness.  Neurological: She is alert and oriented to person, place, and time.  Skin: Skin is warm and dry.    Data Reviewed Notes and CT scan reviewed.  Labs - hgb has been stable around 10.  Assessment      Hematemesis. Stage 4 colon CA.  Dr. Oliva Bustard, her oncologist, has requested an endoscopy - look for potential bleeding source.  Pt has not vomited after last night. Discussed fully with pt and she is agreeable to an endoscopy  Plan    Pt to be scheduled for an upper endoscopy.      Patient is scheduled for an upper endoscopy at [facility] on 11/19/15. She will have nothing to eat after midnight and may have clear liquids up till 8 am the morning of procedure. Patient is aware of date and instructions.   PCP:  Lynnell Jude This information has been scribed by Gaspar Cola CMA.   SANKAR,SEEPLAPUTHUR G 11/16/2015, 12:21 PM

## 2015-11-19 NOTE — Interval H&P Note (Signed)
History and Physical Interval Note:  11/19/2015 3:04 PM  Misty Mack  has presented today for surgery, with the diagnosis of HEMATEMESIS  The various methods of treatment have been discussed with the patient and family. After consideration of risks, benefits and other options for treatment, the patient has consented to  Procedure(s): ESOPHAGOGASTRODUODENOSCOPY (EGD) WITH PROPOFOL (N/A) as a surgical intervention .  The patient's history has been reviewed, patient examined, no change in status, stable for surgery.  I have reviewed the patient's chart and labs.  Questions were answered to the patient's satisfaction.     Dempsey Knotek G

## 2015-11-19 NOTE — Transfer of Care (Signed)
Immediate Anesthesia Transfer of Care Note  Patient: Misty Mack  Procedure(s) Performed: Procedure(s): ESOPHAGOGASTRODUODENOSCOPY (EGD) WITH PROPOFOL (N/A)  Patient Location: PACU  Anesthesia Type:General  Level of Consciousness: sedated  Airway & Oxygen Therapy: Patient Spontanous Breathing and Patient connected to face mask oxygen  Post-op Assessment: Report given to RN and Post -op Vital signs reviewed and stable  Post vital signs: Reviewed and stable  Last Vitals:  Filed Vitals:   11/19/15 1359 11/19/15 1600  BP: 109/87 111/57  Pulse: 112 96  Temp: 37.6 C 36.7 C  Resp: 14 20    Complications: No apparent anesthesia complications

## 2015-11-19 NOTE — Anesthesia Procedure Notes (Signed)
Procedure Name: Intubation Date/Time: 11/19/2015 3:30 PM Performed by: Letitia Neri Pre-anesthesia Checklist: Patient identified, Emergency Drugs available, Suction available, Patient being monitored and Timeout performed Patient Re-evaluated:Patient Re-evaluated prior to inductionOxygen Delivery Method: Circle system utilized Preoxygenation: Pre-oxygenation with 100% oxygen Intubation Type: IV induction, Rapid sequence and Cricoid Pressure applied Laryngoscope Size: Mac and 3 Grade View: Grade I Tube type: Oral Tube size: 7.0 mm Number of attempts: 1 Airway Equipment and Method: Stylet Placement Confirmation: ETT inserted through vocal cords under direct vision,  positive ETCO2 and breath sounds checked- equal and bilateral Secured at: 22 cm Tube secured with: Tape Dental Injury: Teeth and Oropharynx as per pre-operative assessment

## 2015-11-20 ENCOUNTER — Telehealth: Payer: Self-pay | Admitting: *Deleted

## 2015-11-20 MED ORDER — HYDROMORPHONE HCL 4 MG PO TABS: 4.0000 mg | ORAL_TABLET | ORAL | Status: DC | PRN

## 2015-11-20 NOTE — Telephone Encounter (Signed)
Per Dr Oliva Bustard dilaudid 4 mg q 4 - 6 h prn # 15, Move MRI scan up to asap and see me after done or the next day. Pt informed of this and that she may possible have a reaction to the dilaudid and she needs to call us asap if she does. She repeated this back to me and she will come in to pick up rx and await call from scheduler.

## 2015-11-20 NOTE — Telephone Encounter (Signed)
Called requesting stronger papin med than Tramadol and tylenol, She states she has low back rated at 10/10 adn she only gets minimal relief for an hour after taking Tramadol. She reports she had an endoscopy yesterday

## 2015-11-20 NOTE — Telephone Encounter (Signed)
faxed

## 2015-11-21 ENCOUNTER — Encounter: Payer: Self-pay | Admitting: General Surgery

## 2015-11-21 LAB — SURGICAL PATHOLOGY

## 2015-11-21 NOTE — Anesthesia Postprocedure Evaluation (Signed)
Anesthesia Post Note  Patient: Misty Mack  Procedure(s) Performed: Procedure(s) (LRB): ESOPHAGOGASTRODUODENOSCOPY (EGD) WITH PROPOFOL (N/A)  Patient location during evaluation: PACU Anesthesia Type: General Level of consciousness: awake and alert and oriented Pain management: pain level controlled Vital Signs Assessment: post-procedure vital signs reviewed and stable Respiratory status: spontaneous breathing Cardiovascular status: blood pressure returned to baseline Anesthetic complications: no    Last Vitals:  Filed Vitals:   11/19/15 1712 11/19/15 1722  BP: 101/86 110/71  Pulse: 67 84  Temp:    Resp: 18 19    Last Pain:  Filed Vitals:   11/20/15 0744  PainSc: 0-No pain                 Maclaine Ahola

## 2015-11-23 ENCOUNTER — Ambulatory Visit (HOSPITAL_COMMUNITY)
Admission: RE | Admit: 2015-11-23 | Discharge: 2015-11-23 | Disposition: A | Discharge status: Home / Self Care | Payer: 59 | Source: Ambulatory Visit | Attending: Oncology | Admitting: Oncology

## 2015-11-23 ENCOUNTER — Telehealth: Payer: Self-pay | Admitting: *Deleted

## 2015-11-23 ENCOUNTER — Other Ambulatory Visit: Payer: Self-pay | Admitting: Oncology

## 2015-11-23 DIAGNOSIS — C189 Malignant neoplasm of colon, unspecified: Secondary | ICD-10-CM | POA: Insufficient documentation

## 2015-11-23 DIAGNOSIS — M549 Dorsalgia, unspecified: Secondary | ICD-10-CM | POA: Diagnosis not present

## 2015-11-23 DIAGNOSIS — K7689 Other specified diseases of liver: Secondary | ICD-10-CM | POA: Diagnosis not present

## 2015-11-23 DIAGNOSIS — M5124 Other intervertebral disc displacement, thoracic region: Secondary | ICD-10-CM | POA: Insufficient documentation

## 2015-11-23 MED ORDER — HYDROMORPHONE HCL 4 MG PO TABS: 4.0000 mg | ORAL_TABLET | ORAL | Status: DC | PRN

## 2015-11-23 MED ORDER — GADOBENATE DIMEGLUMINE 529 MG/ML IV SOLN
15.0000 mL | Freq: Once | INTRAVENOUS | Status: AC | PRN
  Administered 2015-11-23: 15 mL via INTRAVENOUS

## 2015-11-23 NOTE — Telephone Encounter (Signed)
Informed that prescription is ready to pick up  

## 2015-11-26 ENCOUNTER — Encounter: Payer: Self-pay | Admitting: Oncology

## 2015-11-26 ENCOUNTER — Inpatient Hospital Stay (HOSPITAL_BASED_OUTPATIENT_CLINIC_OR_DEPARTMENT_OTHER): Payer: 59 | Admitting: Oncology

## 2015-11-26 VITALS — BP 113/79 | HR 100 | Temp 97.7°F | Resp 18 | Wt 157.8 lb

## 2015-11-26 DIAGNOSIS — Z79899 Other long term (current) drug therapy: Secondary | ICD-10-CM

## 2015-11-26 DIAGNOSIS — K7689 Other specified diseases of liver: Secondary | ICD-10-CM

## 2015-11-26 DIAGNOSIS — R634 Abnormal weight loss: Secondary | ICD-10-CM

## 2015-11-26 DIAGNOSIS — H539 Unspecified visual disturbance: Secondary | ICD-10-CM

## 2015-11-26 DIAGNOSIS — R599 Enlarged lymph nodes, unspecified: Secondary | ICD-10-CM

## 2015-11-26 DIAGNOSIS — Z888 Allergy status to other drugs, medicaments and biological substances status: Secondary | ICD-10-CM

## 2015-11-26 DIAGNOSIS — M549 Dorsalgia, unspecified: Secondary | ICD-10-CM

## 2015-11-26 DIAGNOSIS — C185 Malignant neoplasm of splenic flexure: Secondary | ICD-10-CM | POA: Diagnosis not present

## 2015-11-26 DIAGNOSIS — C787 Secondary malignant neoplasm of liver and intrahepatic bile duct: Secondary | ICD-10-CM | POA: Diagnosis not present

## 2015-11-26 DIAGNOSIS — R51 Headache: Secondary | ICD-10-CM

## 2015-11-26 DIAGNOSIS — Z9221 Personal history of antineoplastic chemotherapy: Secondary | ICD-10-CM

## 2015-11-26 DIAGNOSIS — R630 Anorexia: Secondary | ICD-10-CM

## 2015-11-26 DIAGNOSIS — C189 Malignant neoplasm of colon, unspecified: Secondary | ICD-10-CM

## 2015-11-26 MED ORDER — MEGESTROL ACETATE 400 MG/10ML PO SUSP: 400.0000 mg | Freq: Two times a day (BID) | ORAL | Status: DC

## 2015-11-26 NOTE — Progress Notes (Signed)
Patient here today for MRI results.  States she is unable to eat.  Drinking liquids.

## 2015-11-26 NOTE — Progress Notes (Signed)
[place] @ [facility] [phone]  Fax:([phone]) [place] OB: 08-12-1971  MR#: [id number]  UXN#:[id number]  Patient Care Team: Lynnell Jude, MD as PCP - General (Family Medicine) [name], MD (Oncology) Seeplaputhur [name], MD (General Surgery)  CHIEF COMPLAINT:  Chief Complaint  Patient presents with  . Colon Cancer   Oncology History   1. Carcinoma of the colon, splenic  flexure.   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 2. K-RAS mutated. 3. Patient started on FOLFOX and Avastin 4 th June, 2015 4. Allergic reaction  characterized by generalized itching and redness  of face and palm on June 15, 2014 5. Started on FOLFIRI  and avastin   July 13, 2014 6. Finished 12 cycles of chemotherapy, 5-FU based FOLFOX was changed to FOLFIRI BECAUSE OF SENSITIVITY TO OXALIPLATIN(September 07, 2014) 7. Started on maintenance chemotherapy with 5-FU leucovorin and Avastin from September 28, 2014 8.  Progressive disease based on tumor markers as well as PET scan's so patient was started on FOLFOX chemotherapy with desensitizing  protocol.   9.  Progressing disease by CT scan and by symptoms.  Patient will be started on TAS-102 from October, 2016 10.  Progressing disease on  TAS-102 (December, 2016)      INTERVAL HISTORY: \  45 year old lady with progressing colon cancer.  Losing weight.  Appetite is poor. Dilaudid is helping with the pain without any significant side effect. Patient had upper endoscopy at discussed situation with Dr. Conception Oms core biopsy of ulcerative lesion is negative for any malignancy. Patient was supposed to take S2 blocker medication but she did not take that Patient is somewhat drowsy. An MRI scan here to discuss the results and further planning of treatment      REVIEW OF SYSTEMS:   GENERAL:  Feels good.  Active.  No fevers,  sweats or weight loss. PERFORMANCE STATUS (ECOG): 0 HEENT:  No visual changes, runny nose, sore throat, mouth sores or tenderness.  Mild headache Visual disturbances as described about Lungs: No shortness of breath or cough.  No hemoptysis. Cardiac:  No chest pain, palpitations, orthopnea, or PND. GI: Nausea following chemotherapy lasting for more than 24 hours which is now resolved.  No diarrhea. GU:  No urgency, frequency, dysuria, or hematuria. Musculoskeletal:  Back pain as described above Extremities:  No pain or swelling.  Pain in the left hip after fall.took motrn Skin:  No rashes or skin changes. Neuro:  No headache, numbness or weakness, balance or coordination issues. Endocrine:  No diabetes, thyroid issues, hot flashes or night sweats. Psych:  No mood changes, depression or anxiety. Pain:  No focal pain. Review of systems:  All other systems reviewed and found to be negative.  As per HPI. Otherwise, a complete review of systems is negatve.  PAST MEDICAL HISTORY: Past Medical History  Diagnosis Date  . Ulcer   . Anemia   . Obesity   . Constipation   . Migraines   . Cancer ([place]) 2015    colon  . Colon cancer metastasized to liver [facility]) April 2015    T3, N2, M1, stage IV with biopsy-proven liver metastases. On adjuvant chemotherapy    PAST SURGICAL HISTORY: Past Surgical History  Procedure Laterality Date  . Cholecystectomy  2014  . Tubal ligation  2005  . Colonoscopy  2015  . Upper gi endoscopy  2015  . Esophagogastroduodenoscopy (egd) with propofol N/A 11/19/2015    Procedure: ESOPHAGOGASTRODUODENOSCOPY (EGD) WITH PROPOFOL;  Surgeon: Christene Lye, MD;  Location: [facility] ENDOSCOPY;  Service: Endoscopy;  Laterality: N/A;    FAMILY HISTORY No significant family history of malignancy       ADVANCED DIRECTIVES:   Patient does have advanced healthcare directive HEALTH MAINTENANCE: Social History  Substance Use Topics  . Smoking status: Never Smoker     . Smokeless tobacco: None  . Alcohol Use: No      Allergies  Allergen Reactions  . Fentanyl Swelling    Swelling of lips  . Oxaliplatin Itching  . Hydrocodone-Acetaminophen Hives    Current Outpatient Prescriptions  Medication Sig Dispense Refill  . acetaminophen (TYLENOL) 500 MG tablet Take 500 mg by mouth every 6 (six) hours as needed.    Marland Kitchen HYDROmorphone (DILAUDID) 4 MG tablet Take 1 tablet (4 mg total) by mouth every 4 (four) hours as needed for severe pain. 30 tablet 0  . lidocaine (LIDODERM) 5 % Place 1 patch onto the skin daily. Remove & Discard patch within 12 hours or as directed by MD    . omeprazole (PRILOSEC) 20 MG capsule Take 1 capsule (20 mg total) by mouth 2 (two) times daily before a meal. 60 capsule 5  . polyethylene glycol (MIRALAX / GLYCOLAX) packet Take 17 g by mouth daily. Reported on 11/19/2015    . promethazine (PHENERGAN) 25 MG suppository Place 1 suppository (25 mg total) rectally every 6 (six) hours as needed for nausea or vomiting. 6 each 0  . traMADol (ULTRAM) 50 MG tablet Take 1 tablet (50 mg total) by mouth every morning. Take with tylenol 325 mg.tablet 15 tablet 1  . valACYclovir (VALTREX) 1000 MG tablet Take 1,000 mg by mouth 2 (two) times daily. Reported on 11/19/2015     No current facility-administered medications for this visit.   Facility-Administered Medications Ordered in Other Visits  Medication Dose Route Frequency Provider Last Rate Last Dose  . sodium chloride 0.9 % injection 10 mL  10 mL Intracatheter PRN [name], MD   10 mL at 03/21/15 1025  . sodium chloride 0.9 % injection 10 mL  10 mL Intravenous PRN [name], MD   10 mL at 04/21/15 1025    OBJECTIVE:  Filed Vitals:   11/26/15 1129  BP: 113/79  Pulse: 100  Temp: 97.7 F (36.5 C)  Resp: 18     Body mass index is 27.08 kg/(m^2).    ECOG FS:0 - Asymptomatic  PHYSICAL EXAM: GENERAL:  Well developed, well nourished, sitting comfortably in the exam room in no acute  distress. MENTAL STATUS:  Alert and oriented to person, place and time. HEAD:  .  Normocephalic, atraumatic, face symmetric, no Cushingoid features. EYES:   Pupils equal round and reactive to light and accomodation.  No conjunctivitis or scleral icterus. ENT:  Oropharynx clear without lesion.  Tongue normal. Mucous membranes moist.  RESPIRATORY:  Clear to auscultation without rales, wheezes or rhonchi. CARDIOVASCULAR:  Regular rate and rhythm without murmur, rub or gallop. BREAST:  Right breast without masses, skin changes or nipple discharge.  Left breast without masses, skin changes or nipple discharge. ABDOMEN:  Soft, non-tender, with active bowel sounds, and no hepatosplenomegaly.  No masses. BACK:  No CVA tenderness.  No tenderness on percussion of the back or rib cage. SKIN:  No rashes, ulcers or lesions. EXTREMITIES: No edema, no skin discoloration or tenderness.  No palpable cords. LYMPH NODES:  No palpable cervical, supraclavicular, axillary or inguinal adenopathy  NEUROLOGICAL: Unremarkable. PSYCH:  Appropriate.   LAB RESULTS:  IMPRESSION: Shallow disc herniations at T7-8 and T8-9 that do not appear to cause neural compression but could be a cause of back pain.  Minimal degenerative changes also at T6-7, T9-10 and T10-11.  No evidence of metastatic disease to the spine. No evidence fracture or other pathologic process.  T2 bright areas in the liver. These may simply represent liver cysts, but there are not completely or properly evaluated by thoracic MRI.  No visits with results within 3 Day(s) from this visit. Latest known visit with results is:  Admission on 11/19/2015, Discharged on 11/19/2015  Component Date Value Ref Range Status  . Preg Test, Ur 11/19/2015 NEGATIVE  NEGATIVE Final   Comment:        THE SENSITIVITY OF THIS METHODOLOGY IS >24 mIU/mL   . SURGICAL PATHOLOGY 11/19/2015    Final                   Value:Surgical Pathology CASE:  [id number] PATIENT: Kristopher Nance Surgical Pathology Report     SPECIMEN SUBMITTED: A. Stomach, pyloric antrum, cbx B. Stomach, cardia, cbx  CLINICAL HISTORY: None provided  PRE-OPERATIVE DIAGNOSIS: Hematemesis  POST-OPERATIVE DIAGNOSIS: Normal esophagus and duodenum, erythematous gastric mucosa     DIAGNOSIS: A. STOMACH, PYLORIC ANTRUM; COLD BIOPSY: - MILD REACTIVE GASTROPATHY. - NEGATIVE FOR ACTIVE INFLAMMATION, INTESTINAL METAPLASIA, DYSPLASIA, AND MALIGNANCY. - NEGATIVE FOR HELICOBACTER PYLORI IN HEMATOXYLIN AND EOSIN SECTIONS.  B. STOMACH, CARDIA; COLD BIOPSY: - EROSIVE GASTRITIS WITH MARKED REACTIVE FOVEOLAR HYPERPLASIA AND REACTIVE SURFACE EPITHELIAL CHANGES. - NEGATIVE FOR INTESTINAL METAPLASIA, ATROPHY, DYSPLASIA, AND MALIGNANCY. - NEGATIVE FOR HELICOBACTER PYLORI IN HEMATOXYLIN AND EOSIN SECTIONS.   GROSS DESCRIPTION: A. Labeled: C BX pyloric antrum Tissue fragment(s): 1 Size: 0.3 cm Description: Tan  E                         ntirely submitted in one cassette(s).   B. Labeled: C BX cardia Tissue fragment(s): multiple Size: aggregate, 1.5 x 0.4 x 0.2 cm Description: tan fragments  Entirely submitted in one cassette(s).  Final Diagnosis performed by Bryan Lemma, MD.  Electronically signed 11/21/2015 3:52:52PM    The electronic signature indicates that the named Attending Pathologist has evaluated the specimen  Technical component performed at [facility], [address] [place], [place] [address] Lab: [phone] Dir: Darrick Penna. Evette Doffing, MD  Professional component performed at [facility], [facility], [place], [place], [place] [address] Lab: [phone] Dir: Dellia Nims. Rubinas, MD      No results found for: LABCA2 Lab Results  Component Value Date   CA199 <1 02/03/2014   Lab Results  Component Value Date   CEA 6.2* 11/12/2015   Component     Latest Ref Rng 01/25/2015 03/08/2015 03/21/2015 04/18/2015 05/23/2015   CEA     0.0 - 4.7 ng/mL 4.8 (H) 6.0 (H) 5.0 (H) 4.2 4.7   Component     Latest Ref Rng 06/13/2015  CEA     0.0 - 4.7 ng/mL 4.4          Electronically Signed  By: Abigail Miyamoto M.D.  On: 07/27/2015 10:33 ASSESSMENT:  1.  Carcinoma of colon stage IV disease progressing disease based on CT scan.  MRI scan does not show any evidence of bone metastases. Back pain is most likely coming from retroperitoneal lymph node enlargement Possibility of radiation being considered. 2,  significant weight loss will start patient on Megace 3.  Severe pain better controlled with Dilaudid 4.  Possibility of 5-FU with continuous infusion along with radiation would be considered if radiation is planned Detailed discussion regarding results of the CT scan and MRI scan. MSI by IHC has been ordered. .  Duration of visit is 25 minutes and 50% of time was spent discussing VARIOUS  options or PALLIATIVE care with other physicians social workerColon cancer   Staging form: Colon and Rectum, AJCC 7th Edition     Clinical: Stage IVB (yT3, N1, M1b) - Signed by [name], MD on 03/07/2015   [name], MD   11/26/2015 11:41 AM  [place] @ [place] [facility]

## 2015-11-29 ENCOUNTER — Ambulatory Visit: Payer: 59

## 2015-12-03 ENCOUNTER — Encounter: Payer: Self-pay | Admitting: Radiation Oncology

## 2015-12-03 ENCOUNTER — Inpatient Hospital Stay: Payer: 59 | Attending: Oncology | Admitting: Oncology

## 2015-12-03 ENCOUNTER — Inpatient Hospital Stay: Payer: 59

## 2015-12-03 ENCOUNTER — Ambulatory Visit
Admission: RE | Admit: 2015-12-03 | Discharge: 2015-12-03 | Disposition: A | Discharge status: Home / Self Care | Payer: 59 | Source: Ambulatory Visit | Attending: Radiation Oncology | Admitting: Radiation Oncology

## 2015-12-03 ENCOUNTER — Encounter: Payer: Self-pay | Admitting: Oncology

## 2015-12-03 ENCOUNTER — Ambulatory Visit: Payer: 59 | Admitting: Oncology

## 2015-12-03 VITALS — BP 128/83 | HR 111 | Temp 98.5°F | Wt 154.7 lb

## 2015-12-03 VITALS — BP 137/73 | HR 108 | Temp 98.4°F | Resp 18 | Wt 154.8 lb

## 2015-12-03 DIAGNOSIS — R112 Nausea with vomiting, unspecified: Secondary | ICD-10-CM

## 2015-12-03 DIAGNOSIS — C185 Malignant neoplasm of splenic flexure: Secondary | ICD-10-CM | POA: Insufficient documentation

## 2015-12-03 DIAGNOSIS — Z8669 Personal history of other diseases of the nervous system and sense organs: Secondary | ICD-10-CM | POA: Insufficient documentation

## 2015-12-03 DIAGNOSIS — F1120 Opioid dependence, uncomplicated: Secondary | ICD-10-CM | POA: Insufficient documentation

## 2015-12-03 DIAGNOSIS — M5124 Other intervertebral disc displacement, thoracic region: Secondary | ICD-10-CM | POA: Diagnosis not present

## 2015-12-03 DIAGNOSIS — Z7952 Long term (current) use of systemic steroids: Secondary | ICD-10-CM | POA: Diagnosis not present

## 2015-12-03 DIAGNOSIS — H539 Unspecified visual disturbance: Secondary | ICD-10-CM | POA: Diagnosis not present

## 2015-12-03 DIAGNOSIS — C787 Secondary malignant neoplasm of liver and intrahepatic bile duct: Secondary | ICD-10-CM | POA: Diagnosis not present

## 2015-12-03 DIAGNOSIS — R51 Headache: Secondary | ICD-10-CM | POA: Insufficient documentation

## 2015-12-03 DIAGNOSIS — R110 Nausea: Secondary | ICD-10-CM | POA: Insufficient documentation

## 2015-12-03 DIAGNOSIS — M542 Cervicalgia: Secondary | ICD-10-CM | POA: Diagnosis not present

## 2015-12-03 DIAGNOSIS — Z923 Personal history of irradiation: Secondary | ICD-10-CM | POA: Diagnosis not present

## 2015-12-03 DIAGNOSIS — Z79899 Other long term (current) drug therapy: Secondary | ICD-10-CM | POA: Diagnosis not present

## 2015-12-03 DIAGNOSIS — Z8711 Personal history of peptic ulcer disease: Secondary | ICD-10-CM | POA: Insufficient documentation

## 2015-12-03 DIAGNOSIS — E669 Obesity, unspecified: Secondary | ICD-10-CM | POA: Insufficient documentation

## 2015-12-03 DIAGNOSIS — E876 Hypokalemia: Secondary | ICD-10-CM | POA: Diagnosis not present

## 2015-12-03 DIAGNOSIS — R197 Diarrhea, unspecified: Secondary | ICD-10-CM | POA: Diagnosis not present

## 2015-12-03 DIAGNOSIS — Z862 Personal history of diseases of the blood and blood-forming organs and certain disorders involving the immune mechanism: Secondary | ICD-10-CM | POA: Insufficient documentation

## 2015-12-03 DIAGNOSIS — C189 Malignant neoplasm of colon, unspecified: Secondary | ICD-10-CM | POA: Insufficient documentation

## 2015-12-03 DIAGNOSIS — G893 Neoplasm related pain (acute) (chronic): Secondary | ICD-10-CM | POA: Insufficient documentation

## 2015-12-03 DIAGNOSIS — M549 Dorsalgia, unspecified: Secondary | ICD-10-CM | POA: Diagnosis not present

## 2015-12-03 DIAGNOSIS — Z931 Gastrostomy status: Secondary | ICD-10-CM | POA: Insufficient documentation

## 2015-12-03 DIAGNOSIS — K5900 Constipation, unspecified: Secondary | ICD-10-CM | POA: Insufficient documentation

## 2015-12-03 DIAGNOSIS — Z510 Encounter for antineoplastic radiation therapy: Secondary | ICD-10-CM | POA: Diagnosis not present

## 2015-12-03 DIAGNOSIS — Z888 Allergy status to other drugs, medicaments and biological substances status: Secondary | ICD-10-CM | POA: Diagnosis not present

## 2015-12-03 DIAGNOSIS — R630 Anorexia: Secondary | ICD-10-CM

## 2015-12-03 DIAGNOSIS — Z9221 Personal history of antineoplastic chemotherapy: Secondary | ICD-10-CM | POA: Insufficient documentation

## 2015-12-03 DIAGNOSIS — R599 Enlarged lymph nodes, unspecified: Secondary | ICD-10-CM

## 2015-12-03 DIAGNOSIS — D649 Anemia, unspecified: Secondary | ICD-10-CM | POA: Insufficient documentation

## 2015-12-03 DIAGNOSIS — Z5111 Encounter for antineoplastic chemotherapy: Secondary | ICD-10-CM | POA: Diagnosis not present

## 2015-12-03 DIAGNOSIS — R5383 Other fatigue: Secondary | ICD-10-CM | POA: Insufficient documentation

## 2015-12-03 DIAGNOSIS — R634 Abnormal weight loss: Secondary | ICD-10-CM | POA: Diagnosis not present

## 2015-12-03 MED ORDER — SODIUM CHLORIDE 0.9 % IJ SOLN
10.0000 mL | INTRAMUSCULAR | Status: DC | PRN
  Administered 2015-12-03: 10 mL
  Filled 2015-12-03: qty 10

## 2015-12-03 MED ORDER — HYDROMORPHONE HCL 4 MG PO TABS: 4.0000 mg | ORAL_TABLET | ORAL | Status: DC | PRN

## 2015-12-03 MED ORDER — SODIUM CHLORIDE 0.9 % IV SOLN
Freq: Once | INTRAVENOUS | Status: AC
Start: 2015-12-03 — End: 2015-12-03
  Administered 2015-12-03: 11:00:00 via INTRAVENOUS
  Filled 2015-12-03: qty 1000

## 2015-12-03 MED ORDER — KETOROLAC TROMETHAMINE 15 MG/ML IJ SOLN
15.0000 mg | Freq: Once | INTRAMUSCULAR | Status: AC
  Administered 2015-12-03: 15 mg via INTRAVENOUS
  Filled 2015-12-03: qty 1

## 2015-12-03 MED ORDER — SODIUM CHLORIDE 0.9 % IV SOLN
Freq: Once | INTRAVENOUS | Status: AC
  Administered 2015-12-03: 11:00:00 via INTRAVENOUS
  Filled 2015-12-03: qty 8

## 2015-12-03 MED ORDER — HEPARIN SOD (PORK) LOCK FLUSH 100 UNIT/ML IV SOLN
500.0000 [IU] | Freq: Once | INTRAVENOUS | Status: AC | PRN
  Administered 2015-12-03: 500 [IU]
  Filled 2015-12-03: qty 5

## 2015-12-03 NOTE — Progress Notes (Signed)
Patient would like to know the results of upper endoscopy.  Also states Megace has not helped.  She has only taken a few bites of food in the past 3 days.  States she threw up this morning while brushing her teeth.

## 2015-12-03 NOTE — Consult Note (Signed)
Except an outstanding is perfect of Radiation Oncology NEW PATIENT EVALUATION  Name: Misty Mack  MRN: [id number]  Date:   12/03/2015     DOB: 24-Apr-1971   This 45 y.o. female patient presents to the clinic for initial evaluation of stage IV colon cancer with back pain most likely secondary to enlarging retroperitoneal mass.  REFERRING PHYSICIAN: Lynnell Jude, MD  CHIEF COMPLAINT:  Chief Complaint  Patient presents with  . Cancer    Inital evaluatio of radiation treatment for Colon cancer    DIAGNOSIS: The encounter diagnosis was Malignant neoplasm of colon, unspecified part of colon ([place]).   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 She had an R1 resection with radial margin positive. Tumor was K-ras mutated. She was started on FOLFOX chemotherapy switching toFOLFIRI and avastin in September 2015. She has been on maintenance 5-FU leucovorin and Avastin although his been having increasing back pain narcotic dependent with recent CT scan showing a progressive enlarging mass in the area of the pancreas and stomach in the retroperitoneum which may be the source of her increased back pain. The mass measures approximate 5 x 3 cm. Patient has been having some weight loss will be started on Megace therapy. Currently on Dilaudid for pain control. I've asked to evaluate the patient for possible palliative radiation therapy to the enlarging retroperitoneal mass along with radiation sensitizing 5-FU chemotherapy.  PLANNED TREATMENT REGIMEN: Palliative radiation therapy to retroperitoneum with concurrent chemotherapy  PAST MEDICAL HISTORY:  has a past medical history of Ulcer; Anemia; Obesity; Constipation; Migraines; Cancer [facility])  (2015); and Colon cancer metastasized to liver [facility]) (April 2015).    PAST SURGICAL HISTORY:  Past Surgical History  Procedure Laterality Date  . Cholecystectomy  2014  . Tubal ligation  2005  . Colonoscopy  2015  . Upper gi endoscopy  2015  . Esophagogastroduodenoscopy (egd) with propofol N/A 11/19/2015    Procedure: ESOPHAGOGASTRODUODENOSCOPY (EGD) WITH PROPOFOL;  Surgeon: Christene Lye, MD;  Location: [facility] ENDOSCOPY;  Service: Endoscopy;  Laterality: N/A;    FAMILY HISTORY: family history is not on file.  SOCIAL HISTORY:  reports that she has never smoked. She does not have any smokeless tobacco history on file. She reports that she does not drink alcohol or use illicit drugs.  ALLERGIES: Fentanyl; Oxaliplatin; and Hydrocodone-acetaminophen  MEDICATIONS:  Current Outpatient Prescriptions  Medication Sig Dispense Refill  . acetaminophen (TYLENOL) 500 MG tablet Take 500 mg by mouth every 6 (six) hours as needed.    Marland Kitchen HYDROmorphone (DILAUDID) 4 MG tablet Take 1 tablet (4 mg total) by mouth every 4 (four) hours as needed for severe pain. 60 tablet 0  . KLOR-CON 20 MEQ packet     . lidocaine (LIDODERM) 5 % Place 1 patch onto the skin daily. Remove & Discard patch within 12 hours or as directed by MD    . megestrol (MEGACE) 400 MG/10ML suspension Take 10 mLs (400 mg total) by mouth 2 (two) times daily. 240 mL 3  . omeprazole (PRILOSEC) 20 MG capsule Take 1 capsule (20 mg total) by mouth 2 (two) times daily before a meal. 60 capsule 5  . polyethylene glycol (MIRALAX / GLYCOLAX) packet Take 17 g by mouth daily. Reported on 11/19/2015    .  promethazine (PHENERGAN) 25 MG suppository Place 1 suppository (25 mg total) rectally every 6 (six) hours as needed for nausea or vomiting. 6 each 0  . traMADol (ULTRAM) 50 MG tablet Take 1 tablet (50 mg total) by mouth every morning. Take with tylenol 325 mg.tablet 15 tablet 1  . valACYclovir (VALTREX) 1000 MG tablet Take 1,000 mg by mouth 2 (two)  times daily. Reported on 11/19/2015     No current facility-administered medications for this encounter.   Facility-Administered Medications Ordered in Other Encounters  Medication Dose Route Frequency Provider Last Rate Last Dose  . heparin lock flush 100 unit/mL  500 Units Intracatheter Once PRN [name], MD      . ondansetron (ZOFRAN) 16 mg, dexamethasone (DECADRON) 8 mg in sodium chloride 0.9 % 50 mL IVPB   Intravenous Once [name], MD      . sodium chloride 0.9 % injection 10 mL  10 mL Intracatheter PRN [name], MD   10 mL at 03/21/15 1025  . sodium chloride 0.9 % injection 10 mL  10 mL Intravenous PRN [name], MD   10 mL at 04/21/15 1025  . sodium chloride 0.9 % injection 10 mL  10 mL Intracatheter PRN [name], MD   10 mL at 12/03/15 1105    ECOG PERFORMANCE STATUS:  1 - Symptomatic but completely ambulatory  REVIEW OF SYSTEMS:  Except for the back pain as well as intermittent nausea Patient denies any  fever, chills or night sweats. Patient denies any loss of vision, blurred vision. Patient denies any ringing  of the ears or hearing loss. No irregular heartbeat. Patient denies heart murmur or history of fainting. Patient denies any chest pain or pain radiating to her upper extremities. Patient denies any shortness of breath, difficulty breathing at night, cough or hemoptysis. Patient denies any swelling in the lower legs. Patient denies any nausea vomiting, vomiting of blood, or coffee ground material in the vomitus. Patient denies any stomach pain. Patient states has had normal bowel movements no significant constipation or diarrhea. Patient denies any dysuria, hematuria or significant nocturia. Patient denies any problems walking, swelling in the joints or loss of balance. Patient denies any skin changes, loss of hair or loss of weight. Patient denies any excessive worrying or anxiety or significant depression. Patient denies any problems with insomnia. Patient denies  excessive thirst, polyuria, polydipsia. Patient denies any swollen glands, patient denies easy bruising or easy bleeding. Patient denies any recent infections, allergies or URI. Patient "s visual fields have not changed significantly in recent time.    PHYSICAL EXAM: BP 128/83 mmHg  Pulse 111  Temp(Src) 98.5 F (36.9 C)  Wt 154 lb 10.4 oz (70.15 kg)  LMP 10/19/2015 Well-developed female in NAD. Deep palpation of her spine does not elicit pain motor sensory and DTR levels are equal and symmetric in the upper lower extremities. Deep palpation of her abdomen does not elicit pain. Range of motion of her lower extremities does not elicit pain. Well-developed well-nourished patient in NAD. HEENT reveals PERLA, EOMI, discs not visualized.  Oral cavity is clear. No oral mucosal lesions are identified. Neck is clear without evidence of cervical or supraclavicular adenopathy. Lungs are clear to A&P. Cardiac examination is essentially unremarkable with regular rate and rhythm without murmur rub or thrill. Abdomen is benign with no organomegaly or masses noted. Motor sensory and DTR levels are equal and symmetric in the upper and lower extremities. Cranial nerves II through XII are grossly intact. Proprioception  is intact. No peripheral adenopathy or edema is identified. No motor or sensory levels are noted. Crude visual fields are within normal range.  LABORATORY DATA: Prior pathology reports reviewed    RADIOLOGY RESULTS: CT scans and PET/CT scans are reviewed and compatible with the above-stated findings   IMPRESSION: Progressive metastatic colon cancer with an enlarging mass in the retroperitoneum causing significant back pain in 45 year old female with known stage IV colon cancer  PLAN: At this time I to go ahead with a course of palliative radiation therapy to her enlarging retroperitoneal  mass. I would plan on delivering 4140 cGy in 23 fractions using three-dimensional treatment planning. I have set  up and ordered CT simulation for later this week. Risks and benefits of treatment including possible increased nausea fatigue alteration of blood counts skin reaction all were explained in detail to the patient and her mom. They both seem to comprehend my treatment plan well. I will coordinate with medical oncology her concurrent 5-FU chemotherapy.  I would like to take this opportunity for allowing me to participate in the care of your patient.Armstead Peaks., MD

## 2015-12-03 NOTE — Progress Notes (Signed)
[place] @ [facility] Telephone:(336) [phone]  Fax:([phone]) [place] OB: 05/22/1971  MR#: [id number]  YTK#:[id number]  Patient Care Team: Lynnell Jude, MD as PCP - General (Family Medicine) [name], MD (Oncology) Seeplaputhur [name], MD (General Surgery)  CHIEF COMPLAINT:  Chief Complaint  Patient presents with  . Colon Cancer   Oncology History   1. Carcinoma of the colon, splenic  flexure.   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 2. K-RAS mutated. 3. Patient started on FOLFOX and Avastin 4 th June, 2015 4. Allergic reaction  characterized by generalized itching and redness  of face and palm on June 15, 2014 5. Started on FOLFIRI  and avastin   July 13, 2014 6. Finished 12 cycles of chemotherapy, 5-FU based FOLFOX was changed to FOLFIRI BECAUSE OF SENSITIVITY TO OXALIPLATIN(September 07, 2014) 7. Started on maintenance chemotherapy with 5-FU leucovorin and Avastin from September 28, 2014 8.  Progressive disease based on tumor markers as well as PET scan's so patient was started on FOLFOX chemotherapy with desensitizing  protocol.   9.  Progressing disease by CT scan and by symptoms.  Patient will be started on TAS-102 from October, 2016 10.  Progressing disease on  TAS-102 (December, 2016) 11.patient has been started on palliative radiation therapy to retroperitoneal mass and 5-FU by continuous infusion (December 03, 2015      INTERVAL HISTORY: \  45 year old lady with progressing colon cancer.  Losing weight.  Appetite is poor. Dilaudid is helping with the pain without any significant side effect. Patient had upper endoscopy at discussed situation with Dr. Conception Oms core biopsy of ulcerative lesion is negative for any malignancy. Patient was supposed to take S2 blocker medication but she did not take that Patient is somewhat drowsy. An MRI scan here  to discuss the results and further planning of treatment Poor appetite and declining condition and had upper endoscopy did not reveal any evidence of ulcers or malignancy. Pain is somewhat better controlled with Dilaudid but still continues to have mild distress  Persistent nausea throughout this morning    REVIEW OF SYSTEMS:   GENERAL:  Feels good.  Active.  No fevers, sweats or weight loss. PERFORMANCE STATUS (ECOG): 0 HEENT:  No visual changes, runny nose, sore throat, mouth sores or tenderness.  Mild headache Visual disturbances as described about Lungs: No shortness of breath or cough.  No hemoptysis. Cardiac:  No chest pain, palpitations, orthopnea, or PND. GI: Nausea following chemotherapy lasting for more than 24 hours which is now resolved.  No diarrhea. GU:  No urgency, frequency, dysuria, or hematuria. Musculoskeletal:  Back pain as described above Extremities:  No pain or swelling.  Pain in the left hip after fall.took motrn Skin:  No rashes or skin changes. Neuro:  No headache, numbness or weakness, balance or coordination issues. Endocrine:  No diabetes, thyroid issues, hot flashes or night sweats. Psych:  No mood changes, depression or anxiety. Pain:  No focal pain. Review of systems:  All other systems reviewed and found to be negative.  As per HPI. Otherwise, a complete review of systems is negatve.  PAST MEDICAL HISTORY: Past Medical History  Diagnosis Date  . Ulcer   . Anemia   . Obesity   . Constipation   . Migraines   . Cancer ([place]) 2015    colon  . Colon cancer  metastasized to liver [facility]) April 2015    T3, N2, M1, stage IV with biopsy-proven liver metastases. On adjuvant chemotherapy    PAST SURGICAL HISTORY: Past Surgical History  Procedure Laterality Date  . Cholecystectomy  2014  . Tubal ligation  2005  . Colonoscopy  2015  . Upper gi endoscopy  2015  . Esophagogastroduodenoscopy (egd) with propofol N/A 11/19/2015    Procedure:  ESOPHAGOGASTRODUODENOSCOPY (EGD) WITH PROPOFOL;  Surgeon: Christene Lye, MD;  Location: [facility] ENDOSCOPY;  Service: Endoscopy;  Laterality: N/A;    FAMILY HISTORY No significant family history of malignancy       ADVANCED DIRECTIVES:   Patient does have advanced healthcare directive HEALTH MAINTENANCE: Social History  Substance Use Topics  . Smoking status: Never Smoker   . Smokeless tobacco: Not on file  . Alcohol Use: No      Allergies  Allergen Reactions  . Fentanyl Swelling    Swelling of lips  . Oxaliplatin Itching  . Hydrocodone-Acetaminophen Hives    Current Outpatient Prescriptions  Medication Sig Dispense Refill  . acetaminophen (TYLENOL) 500 MG tablet Take 500 mg by mouth every 6 (six) hours as needed.    Marland Kitchen HYDROmorphone (DILAUDID) 4 MG tablet Take 1 tablet (4 mg total) by mouth every 4 (four) hours as needed for severe pain. 60 tablet 0  . KLOR-CON 20 MEQ packet     . lidocaine (LIDODERM) 5 % Place 1 patch onto the skin daily. Remove & Discard patch within 12 hours or as directed by MD    . megestrol (MEGACE) 400 MG/10ML suspension Take 10 mLs (400 mg total) by mouth 2 (two) times daily. 240 mL 3  . omeprazole (PRILOSEC) 20 MG capsule Take 1 capsule (20 mg total) by mouth 2 (two) times daily before a meal. 60 capsule 5  . polyethylene glycol (MIRALAX / GLYCOLAX) packet Take 17 g by mouth daily. Reported on 11/19/2015    . promethazine (PHENERGAN) 25 MG suppository Place 1 suppository (25 mg total) rectally every 6 (six) hours as needed for nausea or vomiting. 6 each 0  . traMADol (ULTRAM) 50 MG tablet Take 1 tablet (50 mg total) by mouth every morning. Take with tylenol 325 mg.tablet 15 tablet 1  . valACYclovir (VALTREX) 1000 MG tablet Take 1,000 mg by mouth 2 (two) times daily. Reported on 11/19/2015     No current facility-administered medications for this visit.   Facility-Administered Medications Ordered in Other Visits  Medication Dose Route Frequency  Provider Last Rate Last Dose  . sodium chloride 0.9 % injection 10 mL  10 mL Intracatheter PRN [name], MD   10 mL at 03/21/15 1025  . sodium chloride 0.9 % injection 10 mL  10 mL Intravenous PRN [name], MD   10 mL at 04/21/15 1025    OBJECTIVE:  Filed Vitals:   12/03/15 1014  BP: 137/73  Pulse: 108  Temp: 98.4 F (36.9 C)  Resp: 18     Body mass index is 26.55 kg/(m^2).    ECOG FS:0 - Asymptomatic  PHYSICAL EXAM: GENERAL:  Well developed, well nourished, sitting comfortably in the exam room in no acute distress. MENTAL STATUS:  Alert and oriented to person, place and time. HEAD:  .  Normocephalic, atraumatic, face symmetric, no Cushingoid features. EYES:   Pupils equal round and reactive to light and accomodation.  No conjunctivitis or scleral icterus. ENT:  Oropharynx clear without lesion.  Tongue normal. Mucous membranes moist.  RESPIRATORY:  Clear to auscultation  without rales, wheezes or rhonchi. CARDIOVASCULAR:  Regular rate and rhythm without murmur, rub or gallop. BREAST:  Right breast without masses, skin changes or nipple discharge.  Left breast without masses, skin changes or nipple discharge. ABDOMEN:  Soft, non-tender, with active bowel sounds, and no hepatosplenomegaly.  No masses. BACK:  No CVA tenderness.  No tenderness on percussion of the back or rib cage. SKIN:  No rashes, ulcers or lesions. EXTREMITIES: No edema, no skin discoloration or tenderness.  No palpable cords. LYMPH NODES: No palpable cervical, supraclavicular, axillary or inguinal adenopathy  NEUROLOGICAL: Unremarkable. PSYCH:  Appropriate.   LAB RESULTS:  IMPRESSION: Shallow disc herniations at T7-8 and T8-9 that do not appear to cause neural compression but could be a cause of back pain.  Minimal degenerative changes also at T6-7, T9-10 and T10-11.  No evidence of metastatic disease to the spine. No evidence fracture or other pathologic process.  T2 bright areas in the liver.  These may simply represent liver cysts, but there are not completely or properly evaluated by thoracic MRI.  No visits with results within 3 Day(s) from this visit. Latest known visit with results is:  Admission on 11/19/2015, Discharged on 11/19/2015  Component Date Value Ref Range Status  . Preg Test, Ur 11/19/2015 NEGATIVE  NEGATIVE Final   Comment:        THE SENSITIVITY OF THIS METHODOLOGY IS >24 mIU/mL   . SURGICAL PATHOLOGY 11/19/2015    Final                   Value:Surgical Pathology CASE: [id number] PATIENT: Misty Mack Surgical Pathology Report     SPECIMEN SUBMITTED: A. Stomach, pyloric antrum, cbx B. Stomach, cardia, cbx  CLINICAL HISTORY: None provided  PRE-OPERATIVE DIAGNOSIS: Hematemesis  POST-OPERATIVE DIAGNOSIS: Normal esophagus and duodenum, erythematous gastric mucosa     DIAGNOSIS: A. STOMACH, PYLORIC ANTRUM; COLD BIOPSY: - MILD REACTIVE GASTROPATHY. - NEGATIVE FOR ACTIVE INFLAMMATION, INTESTINAL METAPLASIA, DYSPLASIA, AND MALIGNANCY. - NEGATIVE FOR HELICOBACTER PYLORI IN HEMATOXYLIN AND EOSIN SECTIONS.  B. STOMACH, CARDIA; COLD BIOPSY: - EROSIVE GASTRITIS WITH MARKED REACTIVE FOVEOLAR HYPERPLASIA AND REACTIVE SURFACE EPITHELIAL CHANGES. - NEGATIVE FOR INTESTINAL METAPLASIA, ATROPHY, DYSPLASIA, AND MALIGNANCY. - NEGATIVE FOR HELICOBACTER PYLORI IN HEMATOXYLIN AND EOSIN SECTIONS.   GROSS DESCRIPTION: A. Labeled: C BX pyloric antrum Tissue fragment(s): 1 Size: 0.3 cm Description: Tan  E                         ntirely submitted in one cassette(s).   B. Labeled: C BX cardia Tissue fragment(s): multiple Size: aggregate, 1.5 x 0.4 x 0.2 cm Description: tan fragments  Entirely submitted in one cassette(s).  Final Diagnosis performed by Bryan Lemma, MD.  Electronically signed 11/21/2015 3:52:52PM    The electronic signature indicates that the named Attending Pathologist has evaluated the specimen  Technical component  performed at [facility], [address], [place], [place] [address] Lab: [phone] Dir: Darrick Penna. Evette Doffing, MD  Professional component performed at [facility], [facility], [place], [place], [place] [address] Lab: [phone] Dir: Dellia Nims. Rubinas, MD      No results found for: LABCA2 Lab Results  Component Value Date   CA199 <1 02/03/2014   Lab Results  Component Value Date   CEA 6.2* 11/12/2015   Component     Latest Ref Rng 01/25/2015 03/08/2015 03/21/2015 04/18/2015 05/23/2015  CEA     0.0 - 4.7 ng/mL 4.8 (H) 6.0 (H) 5.0 (H)  4.2 4.7   Component     Latest Ref Rng 06/13/2015  CEA     0.0 - 4.7 ng/mL 4.4          Electronically Signed  By: Abigail Miyamoto M.D.  On: 07/27/2015 10:33 ASSESSMENT:  1.  Carcinoma of colon stage IV disease progressing disease based on CT scan.  MRI scan does not show any evidence of bone metastases. Back pain is most likely coming from retroperitoneal lymph node enlargement Possibility of radiation being considered. 2, significant weight loss will start patient on Megace 3.  Severe pain better controlled with Dilaudid 4.  Possibility of 5-FU with continuous infusion along with radiation would be considered if radiation is planned The situation with Dr. Donella Stade, radiation oncologist.  He is going to start palliative radiation therapy Possibility of 5-FU by continuous infusion would be started along with it We will  IV fluids IV Zofran, Decadron, Toradol because patient is in distress and is of persistent nausea  A patient's nausea and vomiting improves and appetite gets better with IV steroids and prednisone 20 mg on a Megace can be considered.  Will start 5-FU by continuous infusion All the side effects of chemotherapy including myelosuppression, alopecia, nausea vomiting fatigue weakness.  Secondary infection, and   peripheral neuropathy .  Has been discussed in details.Intent of chemotherapy is palliation and  relief in symptoms and extending survivalInformal consent has been obtained and will be documented by nurses in the chart s   Staging form: Colon and Rectum, AJCC 7th Edition     Clinical: Stage IVB (yT3, N1, M1b) - Signed by [name], MD on 03/07/2015   [name], MD   12/03/2015 10:58 AM  [place] @ [facility]

## 2015-12-04 ENCOUNTER — Ambulatory Visit
Admission: RE | Admit: 2015-12-04 | Discharge: 2015-12-04 | Disposition: A | Discharge status: Home / Self Care | Payer: 59 | Source: Ambulatory Visit | Attending: Radiation Oncology | Admitting: Radiation Oncology

## 2015-12-04 ENCOUNTER — Telehealth: Payer: Self-pay | Admitting: *Deleted

## 2015-12-04 ENCOUNTER — Inpatient Hospital Stay: Payer: 59

## 2015-12-04 VITALS — BP 105/73 | HR 69 | Temp 97.0°F | Resp 18

## 2015-12-04 DIAGNOSIS — C189 Malignant neoplasm of colon, unspecified: Secondary | ICD-10-CM

## 2015-12-04 DIAGNOSIS — C185 Malignant neoplasm of splenic flexure: Secondary | ICD-10-CM | POA: Diagnosis not present

## 2015-12-04 MED ORDER — PREDNISONE 20 MG PO TABS: 20.0000 mg | ORAL_TABLET | Freq: Every day | ORAL | Status: DC

## 2015-12-04 MED ORDER — HEPARIN SOD (PORK) LOCK FLUSH 100 UNIT/ML IV SOLN
500.0000 [IU] | Freq: Once | INTRAVENOUS | Status: AC
  Administered 2015-12-04: 500 [IU] via INTRAVENOUS

## 2015-12-04 MED ORDER — HYDROMORPHONE HCL 1 MG/ML IJ SOLN
2.0000 mg | Freq: Once | INTRAMUSCULAR | Status: AC
  Administered 2015-12-04: 2 mg via INTRAVENOUS

## 2015-12-04 MED ORDER — SODIUM CHLORIDE 0.9% FLUSH
10.0000 mL | INTRAVENOUS | Status: DC | PRN
  Administered 2015-12-04: 10 mL via INTRAVENOUS
  Filled 2015-12-04: qty 10

## 2015-12-04 MED ORDER — KETOROLAC TROMETHAMINE 15 MG/ML IJ SOLN
15.0000 mg | Freq: Once | INTRAMUSCULAR | Status: AC
  Administered 2015-12-04: 15 mg via INTRAVENOUS

## 2015-12-04 NOTE — Telephone Encounter (Signed)
Prednisone 20mg  daily escribed to pharmacy per Dr. Oliva Bustard

## 2015-12-04 NOTE — Addendum Note (Signed)
Addended by: Telford Nab on: 12/04/2015 11:43 AM   Modules accepted: Orders

## 2015-12-04 NOTE — Telephone Encounter (Signed)
Patient states steroids helped her a lot yesterday.  MD mentioned that he could call prescription if they helped.  Patient requesting prescription.

## 2015-12-06 ENCOUNTER — Inpatient Hospital Stay: Payer: 59

## 2015-12-06 ENCOUNTER — Telehealth: Payer: Self-pay | Admitting: *Deleted

## 2015-12-06 ENCOUNTER — Inpatient Hospital Stay (HOSPITAL_BASED_OUTPATIENT_CLINIC_OR_DEPARTMENT_OTHER): Payer: 59 | Admitting: Oncology

## 2015-12-06 VITALS — BP 124/84 | HR 84 | Temp 97.7°F | Resp 18 | Wt 155.6 lb

## 2015-12-06 DIAGNOSIS — H539 Unspecified visual disturbance: Secondary | ICD-10-CM

## 2015-12-06 DIAGNOSIS — C189 Malignant neoplasm of colon, unspecified: Secondary | ICD-10-CM

## 2015-12-06 DIAGNOSIS — M549 Dorsalgia, unspecified: Secondary | ICD-10-CM

## 2015-12-06 DIAGNOSIS — R51 Headache: Secondary | ICD-10-CM

## 2015-12-06 DIAGNOSIS — R599 Enlarged lymph nodes, unspecified: Secondary | ICD-10-CM

## 2015-12-06 DIAGNOSIS — R5383 Other fatigue: Secondary | ICD-10-CM

## 2015-12-06 DIAGNOSIS — M5124 Other intervertebral disc displacement, thoracic region: Secondary | ICD-10-CM

## 2015-12-06 DIAGNOSIS — R634 Abnormal weight loss: Secondary | ICD-10-CM | POA: Diagnosis not present

## 2015-12-06 DIAGNOSIS — C185 Malignant neoplasm of splenic flexure: Secondary | ICD-10-CM | POA: Diagnosis not present

## 2015-12-06 DIAGNOSIS — Z79899 Other long term (current) drug therapy: Secondary | ICD-10-CM

## 2015-12-06 DIAGNOSIS — R630 Anorexia: Secondary | ICD-10-CM

## 2015-12-06 DIAGNOSIS — Z888 Allergy status to other drugs, medicaments and biological substances status: Secondary | ICD-10-CM

## 2015-12-06 LAB — BASIC METABOLIC PANEL
Anion gap: 8 (ref 5–15)
BUN: 5 mg/dL — AB (ref 6–20)
CO2: 29 mmol/L (ref 22–32)
CREATININE: 0.51 mg/dL (ref 0.44–1.00)
Calcium: 9.7 mg/dL (ref 8.9–10.3)
Chloride: 103 mmol/L (ref 101–111)
GFR calc Af Amer: >60 mL/min (ref >60)
GLUCOSE: 108 mg/dL — AB (ref 65–99)
POTASSIUM: 2.5 mmol/L — AB (ref 3.5–5.1)
SODIUM: 140 mmol/L (ref 135–145)

## 2015-12-06 LAB — CBC WITH DIFFERENTIAL/PLATELET
Basophils Absolute: 0 10*3/uL (ref 0–0.1)
Basophils Relative: 0 %
EOS ABS: 0.1 10*3/uL (ref 0–0.7)
EOS PCT: 1 %
HCT: 31.3 % — ABNORMAL LOW (ref 35.0–47.0)
Hemoglobin: 10.7 g/dL — ABNORMAL LOW (ref 12.0–16.0)
LYMPHS ABS: 1.7 10*3/uL (ref 1.0–3.6)
LYMPHS PCT: 20 %
MCH: 29.4 pg (ref 26.0–34.0)
MCHC: 34.2 g/dL (ref 32.0–36.0)
MCV: 86 fL (ref 80.0–100.0)
MONO ABS: 0.9 10*3/uL (ref 0.2–0.9)
MONOS PCT: 10 %
Neutro Abs: 5.9 10*3/uL (ref 1.4–6.5)
Neutrophils Relative %: 69 %
PLATELETS: 239 10*3/uL (ref 150–440)
RBC: 3.63 MIL/uL — AB (ref 3.80–5.20)
RDW: 20.5 % — ABNORMAL HIGH (ref 11.5–14.5)
WBC: 8.6 10*3/uL (ref 3.6–11.0)

## 2015-12-06 MED ORDER — SODIUM CHLORIDE 0.9 % IV SOLN
Freq: Once | INTRAVENOUS | Status: AC
  Administered 2015-12-06: 12:00:00 via INTRAVENOUS
  Filled 2015-12-06: qty 250

## 2015-12-06 MED ORDER — KETOROLAC TROMETHAMINE 15 MG/ML IJ SOLN
15.0000 mg | Freq: Once | INTRAMUSCULAR | Status: AC
  Administered 2015-12-06: 15 mg via INTRAVENOUS
  Filled 2015-12-06: qty 1

## 2015-12-06 MED ORDER — SODIUM CHLORIDE 0.9 % IV SOLN
Freq: Once | INTRAVENOUS | Status: AC
  Administered 2015-12-06: 11:00:00 via INTRAVENOUS
  Filled 2015-12-06: qty 1000

## 2015-12-06 MED ORDER — SODIUM CHLORIDE 0.9% FLUSH
10.0000 mL | INTRAVENOUS | Status: DC | PRN
  Filled 2015-12-06: qty 10

## 2015-12-06 MED ORDER — SODIUM CHLORIDE 0.9 % IV SOLN
200.0000 mg/m2/d | INTRAVENOUS | Status: DC
  Administered 2015-12-06: 2500 mg via INTRAVENOUS
  Filled 2015-12-06: qty 50

## 2015-12-06 NOTE — Telephone Encounter (Signed)
Critical Potassium 2.5. Handed report to Dr Oliva Bustard

## 2015-12-06 NOTE — Telephone Encounter (Signed)
Reports that she vomited "just a little bit" when she got home, She has not vomited since and is not currently nauseated. Instructed patient to take her nausea med as ordered adnif it does not control it, to call us back. She repeated thsi back to me

## 2015-12-08 ENCOUNTER — Encounter: Payer: Self-pay | Admitting: Oncology

## 2015-12-08 NOTE — Progress Notes (Signed)
[place] @ [facility] [phone]  Fax:(336) [place] OB: 14-May-1971  MR#: [id number]  IPJ#:[id number]  Patient Care Team: Lynnell Jude, MD as PCP - General (Family Medicine) [name], MD (Oncology) Seeplaputhur [name], MD (General Surgery)  CHIEF COMPLAINT:  Chief Complaint  Patient presents with  . Colon Cancer   Oncology History   1. Carcinoma of the colon, splenic  flexure.   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 2. K-RAS mutated. 3. Patient started on FOLFOX and Avastin 4 th June, 2015 4. Allergic reaction  characterized by generalized itching and redness  of face and palm on June 15, 2014 5. Started on FOLFIRI  and avastin   July 13, 2014 6. Finished 12 cycles of chemotherapy, 5-FU based FOLFOX was changed to FOLFIRI BECAUSE OF SENSITIVITY TO OXALIPLATIN(September 07, 2014) 7. Started on maintenance chemotherapy with 5-FU leucovorin and Avastin from September 28, 2014 8.  Progressive disease based on tumor markers as well as PET scan's so patient was started on FOLFOX chemotherapy with desensitizing  protocol.   9.  Progressing disease by CT scan and by symptoms.  Patient will be started on TAS-102 from October, 2016 10.  Progressing disease on  TAS-102 (December, 2016) 11.patient has been started on palliative radiation therapy to retroperitoneal mass and 5-FU by continuous infusion (December 03, 2015 12.  Patient started in 5-FU by continuous infusion and radiation therapy (February, 2017)      INTERVAL HISTORY: \  45 year old lady with progressing colon cancer.  Losing weight.  Appetite is poor. Dilaudid is helping with the pain without any significant side effect. Patient had upper endoscopy at discussed situation with Dr. Conception Oms core biopsy of ulcerative lesion is negative for any malignancy. Patient was supposed to take S2  blocker medication but she did not take that Patient is somewhat drowsy. An MRI scan here to discuss the results and further planning of treatment Poor appetite and declining condition and had upper endoscopy did not reveal any evidence of ulcers or malignancy. Pain is somewhat better controlled with Dilaudid but still continues to have mild distress  On Dilaudid and intermittent intravenous Toradol patient's pain is getting better.  Patient is on 20 mg of prednisone which is helping with the pain and appetite. starting radiation therapy and here to get evaluation for 5-FU by continuous infusion.     REVIEW OF SYSTEMS:   GENERAL:  Feels good.  Active.  No fevers, sweats or weight loss. PERFORMANCE STATUS (ECOG): 0 HEENT:  No visual changes, runny nose, sore throat, mouth sores or tenderness.  Mild headache Visual disturbances as described about Lungs: No shortness of breath or cough.  No hemoptysis. Cardiac:  No chest pain, palpitations, orthopnea, or PND. GI: Nausea following chemotherapy lasting for more than 24 hours which is now resolved.  No diarrhea. GU:  No urgency, frequency, dysuria, or hematuria. Musculoskeletal:  Back pain as described above Extremities:  No pain or swelling.  Pain in the left hip after fall.took motrn Skin:  No rashes or skin changes. Neuro:  No headache, numbness or weakness, balance or coordination issues. Endocrine:  No diabetes, thyroid issues, hot flashes or night sweats. Psych:  No mood changes, depression or anxiety. Pain:  No focal pain. Review of systems:  All other systems reviewed and found to be negative.  As per HPI. Otherwise,  a complete review of systems is negatve.  PAST MEDICAL HISTORY: Past Medical History  Diagnosis Date  . Ulcer   . Anemia   . Obesity   . Constipation   . Migraines   . Cancer ([place]) 2015    colon  . Colon cancer metastasized to liver [facility]) April 2015    T3, N2, M1, stage IV with biopsy-proven liver metastases.  On adjuvant chemotherapy    PAST SURGICAL HISTORY: Past Surgical History  Procedure Laterality Date  . Cholecystectomy  2014  . Tubal ligation  2005  . Colonoscopy  2015  . Upper gi endoscopy  2015  . Esophagogastroduodenoscopy (egd) with propofol N/A 11/19/2015    Procedure: ESOPHAGOGASTRODUODENOSCOPY (EGD) WITH PROPOFOL;  Surgeon: Christene Lye, MD;  Location: [facility] ENDOSCOPY;  Service: Endoscopy;  Laterality: N/A;    FAMILY HISTORY No significant family history of malignancy       ADVANCED DIRECTIVES:   Patient does have advanced healthcare directive HEALTH MAINTENANCE: Social History  Substance Use Topics  . Smoking status: Never Smoker   . Smokeless tobacco: None  . Alcohol Use: No      Allergies  Allergen Reactions  . Fentanyl Swelling    Swelling of lips  . Oxaliplatin Itching  . Hydrocodone-Acetaminophen Hives    Current Outpatient Prescriptions  Medication Sig Dispense Refill  . acetaminophen (TYLENOL) 500 MG tablet Take 500 mg by mouth every 6 (six) hours as needed.    Marland Kitchen HYDROmorphone (DILAUDID) 4 MG tablet Take 1 tablet (4 mg total) by mouth every 4 (four) hours as needed for severe pain. 60 tablet 0  . KLOR-CON 20 MEQ packet     . lidocaine (LIDODERM) 5 % Place 1 patch onto the skin daily. Remove & Discard patch within 12 hours or as directed by MD    . megestrol (MEGACE) 400 MG/10ML suspension Take 10 mLs (400 mg total) by mouth 2 (two) times daily. 240 mL 3  . omeprazole (PRILOSEC) 20 MG capsule Take 1 capsule (20 mg total) by mouth 2 (two) times daily before a meal. 60 capsule 5  . polyethylene glycol (MIRALAX / GLYCOLAX) packet Take 17 g by mouth daily. Reported on 11/19/2015    . predniSONE (DELTASONE) 20 MG tablet Take 1 tablet (20 mg total) by mouth daily with breakfast. 30 tablet 3  . promethazine (PHENERGAN) 25 MG suppository Place 1 suppository (25 mg total) rectally every 6 (six) hours as needed for nausea or vomiting. 6 each 0  .  traMADol (ULTRAM) 50 MG tablet Take 1 tablet (50 mg total) by mouth every morning. Take with tylenol 325 mg.tablet 15 tablet 1  . valACYclovir (VALTREX) 1000 MG tablet Take 1,000 mg by mouth 2 (two) times daily. Reported on 11/19/2015     No current facility-administered medications for this visit.   Facility-Administered Medications Ordered in Other Visits  Medication Dose Route Frequency Provider Last Rate Last Dose  . sodium chloride 0.9 % injection 10 mL  10 mL Intracatheter PRN [name], MD   10 mL at 03/21/15 1025  . sodium chloride 0.9 % injection 10 mL  10 mL Intravenous PRN [name], MD   10 mL at 04/21/15 1025  . sodium chloride 0.9 % injection 10 mL  10 mL Intracatheter PRN [name], MD   10 mL at 12/03/15 1105    OBJECTIVE:  Filed Vitals:   12/06/15 1046  BP: 124/84  Pulse: 84  Temp: 97.7 F (36.5 C)  Resp: 18  Body mass index is 26.7 kg/(m^2).    ECOG FS:0 - Asymptomatic  PHYSICAL EXAM: GENERAL:  Well developed, well nourished, sitting comfortably in the exam room in no acute distress. MENTAL STATUS:  Alert and oriented to person, place and time. HEAD:  .  Normocephalic, atraumatic, face symmetric, no Cushingoid features. EYES:   Pupils equal round and reactive to light and accomodation.  No conjunctivitis or scleral icterus. ENT:  Oropharynx clear without lesion.  Tongue normal. Mucous membranes moist.  RESPIRATORY:  Clear to auscultation without rales, wheezes or rhonchi. CARDIOVASCULAR:  Regular rate and rhythm without murmur, rub or gallop. BREAST:  Right breast without masses, skin changes or nipple discharge.  Left breast without masses, skin changes or nipple discharge. ABDOMEN:  Soft, non-tender, with active bowel sounds, and no hepatosplenomegaly.  No masses. BACK:  No CVA tenderness.  No tenderness on percussion of the back or rib cage. SKIN:  No rashes, ulcers or lesions. EXTREMITIES: No edema, no skin discoloration or tenderness.  No palpable  cords. LYMPH NODES: No palpable cervical, supraclavicular, axillary or inguinal adenopathy  NEUROLOGICAL: Unremarkable. PSYCH:  Appropriate.   LAB RESULTS:  IMPRESSION: Shallow disc herniations at T7-8 and T8-9 that do not appear to cause neural compression but could be a cause of back pain.  Minimal degenerative changes also at T6-7, T9-10 and T10-11.  No evidence of metastatic disease to the spine. No evidence fracture or other pathologic process.  T2 bright areas in the liver. These may simply represent liver cysts, but there are not completely or properly evaluated by thoracic MRI.  Appointment on 12/06/2015  Component Date Value Ref Range Status  . WBC 12/06/2015 8.6  3.6 - 11.0 K/uL Final  . RBC 12/06/2015 3.63* 3.80 - 5.20 MIL/uL Final  . Hemoglobin 12/06/2015 10.7* 12.0 - 16.0 g/dL Final  . HCT 12/06/2015 31.3* 35.0 - 47.0 % Final  . MCV 12/06/2015 86.0  80.0 - 100.0 fL Final  . MCH 12/06/2015 29.4  26.0 - 34.0 pg Final  . MCHC 12/06/2015 34.2  32.0 - 36.0 g/dL Final  . RDW 12/06/2015 20.5* 11.5 - 14.5 % Final  . Platelets 12/06/2015 239  150 - 440 K/uL Final  . Neutrophils Relative % 12/06/2015 69   Final  . Neutro Abs 12/06/2015 5.9  1.4 - 6.5 K/uL Final  . Lymphocytes Relative 12/06/2015 20   Final  . Lymphs Abs 12/06/2015 1.7  1.0 - 3.6 K/uL Final  . Monocytes Relative 12/06/2015 10   Final  . Monocytes Absolute 12/06/2015 0.9  0.2 - 0.9 K/uL Final  . Eosinophils Relative 12/06/2015 1   Final  . Eosinophils Absolute 12/06/2015 0.1  0 - 0.7 K/uL Final  . Basophils Relative 12/06/2015 0   Final  . Basophils Absolute 12/06/2015 0.0  0 - 0.1 K/uL Final  . Sodium 12/06/2015 140  135 - 145 mmol/L Final  . Potassium 12/06/2015 2.5* 3.5 - 5.1 mmol/L Final   Comment: CRITICAL RESULT CALLED TO, READ BACK BY AND VERIFIED WITH BRENDA ELLINGTON AT 1105 12/06/15 MSS.   . Chloride 12/06/2015 103  101 - 111 mmol/L Final  . CO2 12/06/2015 29  22 - 32 mmol/L Final  .  Glucose, Bld 12/06/2015 108* 65 - 99 mg/dL Final  . BUN 12/06/2015 5* 6 - 20 mg/dL Final  . Creatinine, Ser 12/06/2015 0.51  0.44 - 1.00 mg/dL Final  . Calcium 12/06/2015 9.7  8.9 - 10.3 mg/dL Final  . GFR calc non Af Amer 12/06/2015 >  60  >60 mL/min Final  . GFR calc Af Amer 12/06/2015 >60  >60 mL/min Final   Comment: (NOTE) The eGFR has been calculated using the CKD EPI equation. This calculation has not been validated in all clinical situations. eGFR's persistently <60 mL/min signify possible Chronic Kidney Disease.   . Anion gap 12/06/2015 8  5 - 15 Final    No results found for: LABCA2 Lab Results  Component Value Date   CA199 <1 02/03/2014   Lab Results  Component Value Date   CEA 6.2* 11/12/2015   Component     Latest Ref Rng 01/25/2015 03/08/2015 03/21/2015 04/18/2015 05/23/2015  CEA     0.0 - 4.7 ng/mL 4.8 (H) 6.0 (H) 5.0 (H) 4.2 4.7   Component     Latest Ref Rng 06/13/2015  CEA     0.0 - 4.7 ng/mL 4.4          Electronically Signed  By: Abigail Miyamoto M.D.  On: 07/27/2015 10:33 ASSESSMENT:  1.  Carcinoma of colon stage IV disease progressing disease based on CT scan.  MRI scan does not show any evidence of bone metastases. Back pain is most likely coming from retroperitoneal lymph node enlargement Possibility of radiation being considered. 2, significant weight loss will start patient on Megace 3.  Severe pain better controlled with Dilaudid 4.  Possibility of 5-FU with continuous infusion along with radiation would be considered if radiation is planned The situation with Dr. Donella Stade, radiation oncologist.  He is going to start palliative radiation therapy Possibility of 5-FU by continuous infusion would be started along with it We will  IV fluids IV Zofran, Decadron, Toradol because patient is in distress and is of persistent nausea 5-FU by continuous infusion has been started.  Patient will receive IV fluid and IV Toradol for pain control.  Patient is  also starting radiation therapy Overall prognosis is poor.  Will start 5-FU by continuous infusion All the side effects of chemotherapy including myelosuppression, alopecia, nausea vomiting fatigue weakness.  Secondary infection, and   peripheral neuropathy .  Has been discussed in details.Intent of chemotherapy is palliation and relief in symptoms and extending survivalInformal consent has been obtained and will be documented by nurses in the chart s   Staging form: Colon and Rectum, AJCC 7th Edition     Clinical: Stage IVB (yT3, N1, M1b) - Signed by [name], MD on 03/07/2015   [name], MD   12/08/2015 5:09 PM  [place] @ [facility]

## 2015-12-10 ENCOUNTER — Inpatient Hospital Stay: Payer: 59

## 2015-12-10 ENCOUNTER — Other Ambulatory Visit: Payer: 59

## 2015-12-10 ENCOUNTER — Ambulatory Visit: Payer: 59

## 2015-12-11 ENCOUNTER — Ambulatory Visit: Payer: 59

## 2015-12-11 DIAGNOSIS — C185 Malignant neoplasm of splenic flexure: Secondary | ICD-10-CM | POA: Diagnosis not present

## 2015-12-12 ENCOUNTER — Ambulatory Visit: Payer: 59

## 2015-12-12 ENCOUNTER — Telehealth: Payer: Self-pay | Admitting: *Deleted

## 2015-12-12 ENCOUNTER — Ambulatory Visit
Admission: RE | Admit: 2015-12-12 | Discharge: 2015-12-12 | Disposition: A | Discharge status: Home / Self Care | Payer: 59 | Source: Ambulatory Visit | Attending: Radiation Oncology | Admitting: Radiation Oncology

## 2015-12-12 DIAGNOSIS — C185 Malignant neoplasm of splenic flexure: Secondary | ICD-10-CM | POA: Diagnosis not present

## 2015-12-12 MED ORDER — PROCHLORPERAZINE MALEATE 10 MG PO TABS: ORAL_TABLET | ORAL | Status: AC

## 2015-12-12 NOTE — Telephone Encounter (Signed)
Pt having nausea and does not have medication at home to help control nausea. Per Dr. Oliva Bustard, pt can start compazine 10mg  q4-6hours prn for nausea. Prescription sent electronically to pharmacy.

## 2015-12-13 ENCOUNTER — Inpatient Hospital Stay (HOSPITAL_BASED_OUTPATIENT_CLINIC_OR_DEPARTMENT_OTHER): Payer: 59 | Admitting: Oncology

## 2015-12-13 ENCOUNTER — Inpatient Hospital Stay: Payer: 59

## 2015-12-13 ENCOUNTER — Telehealth: Payer: Self-pay | Admitting: *Deleted

## 2015-12-13 ENCOUNTER — Ambulatory Visit
Admission: RE | Admit: 2015-12-13 | Discharge: 2015-12-13 | Disposition: A | Discharge status: Home / Self Care | Payer: 59 | Source: Ambulatory Visit | Attending: Radiation Oncology | Admitting: Radiation Oncology

## 2015-12-13 ENCOUNTER — Encounter: Payer: Self-pay | Admitting: Oncology

## 2015-12-13 VITALS — BP 105/69 | HR 93 | Temp 98.0°F | Resp 18 | Wt 151.7 lb

## 2015-12-13 VITALS — BP 120/80 | HR 85 | Temp 97.1°F | Resp 18

## 2015-12-13 DIAGNOSIS — C787 Secondary malignant neoplasm of liver and intrahepatic bile duct: Secondary | ICD-10-CM | POA: Diagnosis not present

## 2015-12-13 DIAGNOSIS — C189 Malignant neoplasm of colon, unspecified: Secondary | ICD-10-CM

## 2015-12-13 DIAGNOSIS — R599 Enlarged lymph nodes, unspecified: Secondary | ICD-10-CM | POA: Diagnosis not present

## 2015-12-13 DIAGNOSIS — R51 Headache: Secondary | ICD-10-CM

## 2015-12-13 DIAGNOSIS — M542 Cervicalgia: Secondary | ICD-10-CM

## 2015-12-13 DIAGNOSIS — C185 Malignant neoplasm of splenic flexure: Secondary | ICD-10-CM

## 2015-12-13 DIAGNOSIS — R5383 Other fatigue: Secondary | ICD-10-CM

## 2015-12-13 DIAGNOSIS — Z79899 Other long term (current) drug therapy: Secondary | ICD-10-CM

## 2015-12-13 DIAGNOSIS — M5124 Other intervertebral disc displacement, thoracic region: Secondary | ICD-10-CM

## 2015-12-13 DIAGNOSIS — H539 Unspecified visual disturbance: Secondary | ICD-10-CM

## 2015-12-13 DIAGNOSIS — R630 Anorexia: Secondary | ICD-10-CM

## 2015-12-13 DIAGNOSIS — R634 Abnormal weight loss: Secondary | ICD-10-CM

## 2015-12-13 DIAGNOSIS — Z7952 Long term (current) use of systemic steroids: Secondary | ICD-10-CM

## 2015-12-13 LAB — CBC WITH DIFFERENTIAL/PLATELET
BASOS PCT: 0 %
Basophils Absolute: 0 10*3/uL (ref 0–0.1)
Eosinophils Absolute: 0.1 10*3/uL (ref 0–0.7)
Eosinophils Relative: 1 %
HCT: 33.3 % — ABNORMAL LOW (ref 35.0–47.0)
HEMOGLOBIN: 11.5 g/dL — AB (ref 12.0–16.0)
LYMPHS ABS: 1.8 10*3/uL (ref 1.0–3.6)
LYMPHS PCT: 19 %
MCH: 29.9 pg (ref 26.0–34.0)
MCHC: 34.6 g/dL (ref 32.0–36.0)
MCV: 86.5 fL (ref 80.0–100.0)
MONO ABS: 0.8 10*3/uL (ref 0.2–0.9)
MONOS PCT: 8 %
NEUTROS ABS: 7 10*3/uL — AB (ref 1.4–6.5)
NEUTROS PCT: 72 %
Platelets: 253 10*3/uL (ref 150–440)
RBC: 3.85 MIL/uL (ref 3.80–5.20)
RDW: 19.1 % — AB (ref 11.5–14.5)
WBC: 9.7 10*3/uL (ref 3.6–11.0)

## 2015-12-13 LAB — COMPREHENSIVE METABOLIC PANEL
ALK PHOS: 53 U/L (ref 38–126)
ALT: 45 U/L (ref 14–54)
ANION GAP: 9 (ref 5–15)
AST: 31 U/L (ref 15–41)
Albumin: 4 g/dL (ref 3.5–5.0)
BILIRUBIN TOTAL: 1.2 mg/dL (ref 0.3–1.2)
BUN: 7 mg/dL (ref 6–20)
CALCIUM: 9.2 mg/dL (ref 8.9–10.3)
CO2: 26 mmol/L (ref 22–32)
Chloride: 99 mmol/L — ABNORMAL LOW (ref 101–111)
Creatinine, Ser: 0.59 mg/dL (ref 0.44–1.00)
GLUCOSE: 123 mg/dL — AB (ref 65–99)
POTASSIUM: 2.5 mmol/L — AB (ref 3.5–5.1)
Sodium: 134 mmol/L — ABNORMAL LOW (ref 135–145)
TOTAL PROTEIN: 7.2 g/dL (ref 6.5–8.1)

## 2015-12-13 LAB — MAGNESIUM: MAGNESIUM: 1.9 mg/dL (ref 1.7–2.4)

## 2015-12-13 MED ORDER — SODIUM CHLORIDE 0.9 % IV SOLN
Freq: Once | INTRAVENOUS | Status: AC
  Administered 2015-12-13: 11:00:00 via INTRAVENOUS
  Filled 2015-12-13: qty 250

## 2015-12-13 MED ORDER — HEPARIN SOD (PORK) LOCK FLUSH 100 UNIT/ML IV SOLN: 500.0000 [IU] | Freq: Once | INTRAVENOUS | Status: DC | PRN

## 2015-12-13 MED ORDER — SODIUM CHLORIDE 0.9 % IV SOLN
200.0000 mg/m2/d | INTRAVENOUS | Status: DC
  Administered 2015-12-13: 1800 mg via INTRAVENOUS
  Filled 2015-12-13: qty 36

## 2015-12-13 MED ORDER — SODIUM CHLORIDE 0.9 % IV SOLN
Freq: Once | INTRAVENOUS | Status: AC
  Administered 2015-12-13: 11:00:00 via INTRAVENOUS
  Filled 2015-12-13: qty 1000

## 2015-12-13 MED ORDER — SODIUM CHLORIDE 0.9% FLUSH
10.0000 mL | INTRAVENOUS | Status: DC | PRN
  Administered 2015-12-13: 10 mL
  Filled 2015-12-13: qty 10

## 2015-12-13 NOTE — Telephone Encounter (Signed)
Critical K+ 2.5  MD notified. 

## 2015-12-13 NOTE — Progress Notes (Signed)
[place] @ [facility] [phone]) [phone]  Fax:(336) [place] OB: Oct 19, 1971  MR#: [id number]  XHB#:[id number]  Patient Care Team: Lynnell Jude, MD as PCP - General (Family Medicine) [name], MD (Oncology) Seeplaputhur [name], MD (General Surgery)  CHIEF COMPLAINT:  Chief Complaint  Patient presents with  . Colon Cancer   Oncology History   1. Carcinoma of the colon, splenic  flexure.   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 2. K-RAS mutated. 3. Patient started on FOLFOX and Avastin 4 th June, 2015 4. Allergic reaction  characterized by generalized itching and redness  of face and palm on June 15, 2014 5. Started on FOLFIRI  and avastin   July 13, 2014 6. Finished 12 cycles of chemotherapy, 5-FU based FOLFOX was changed to FOLFIRI BECAUSE OF SENSITIVITY TO OXALIPLATIN(September 07, 2014) 7. Started on maintenance chemotherapy with 5-FU leucovorin and Avastin from September 28, 2014 8.  Progressive disease based on tumor markers as well as PET scan's so patient was started on FOLFOX chemotherapy with desensitizing  protocol.   9.  Progressing disease by CT scan and by symptoms.  Patient will be started on TAS-102 from October, 2016 10.  Progressing disease on  TAS-102 (December, 2016) 11.patient has been started on palliative radiation therapy to retroperitoneal mass and 5-FU by continuous infusion (December 03, 2015 12.  Patient started in 5-FU by continuous infusion and radiation therapy (February, 2017)      INTERVAL HISTORY: \  45 year old lady with progressing colon cancer.  Losing weight.  Appetite is poor. Dilaudid is helping with the pain without any significant side effect. Patient had upper endoscopy at discussed situation with Dr. Conception Oms core biopsy of ulcerative lesion is negative for any malignancy. Patient was supposed to take S2  blocker medication but she did not take that Patient is somewhat drowsy. An MRI scan here to discuss the results and further planning of treatment Poor appetite and declining condition and had upper endoscopy did not reveal any evidence of ulcers or malignancy. Pain is somewhat better controlled with Dilaudid but still continues to have mild distress  On Dilaudid and intermittent intravenous Toradol patient's pain is getting better.  Patient is on 20 mg of prednisone which is helping with the pain and appetite. starting radiation therapy and here to get evaluation for 5-FU by continuous infusion. This and tolerated 5-FU chemotherapy very well.  Pain is started improving.  Taking 3 or 4 tablets of Dilaudid to control pain did not have any nausea no soreness in the mouth.  Radiation has been started.     REVIEW OF SYSTEMS:   GENERAL:  Feels good.  Active.  No fevers, sweats or weight loss. PERFORMANCE STATUS (ECOG): 0 HEENT:  No visual changes, runny nose, sore throat, mouth sores or tenderness.  Mild headache Visual disturbances as described about Lungs: No shortness of breath or cough.  No hemoptysis. Cardiac:  No chest pain, palpitations, orthopnea, or PND. GI: Nausea following chemotherapy lasting for more than 24 hours which is now resolved.  No diarrhea. GU:  No urgency, frequency, dysuria, or hematuria. Musculoskeletal:  Back pain as described above Extremities:  No pain or swelling.  Pain in the left hip after fall.took motrn Skin:  No rashes or skin changes. Neuro:  No headache, numbness or weakness, balance or coordination issues. Endocrine:  No diabetes,  thyroid issues, hot flashes or night sweats. Psych:  No mood changes, depression or anxiety. Pain:  No focal pain. Review of systems:  All other systems reviewed and found to be negative.  As per HPI. Otherwise, a complete review of systems is negatve.  PAST MEDICAL HISTORY: Past Medical History  Diagnosis Date  . Ulcer     . Anemia   . Obesity   . Constipation   . Migraines   . Cancer ([place]) 2015    colon  . Colon cancer metastasized to liver [facility]) April 2015    T3, N2, M1, stage IV with biopsy-proven liver metastases. On adjuvant chemotherapy    PAST SURGICAL HISTORY: Past Surgical History  Procedure Laterality Date  . Cholecystectomy  2014  . Tubal ligation  2005  . Colonoscopy  2015  . Upper gi endoscopy  2015  . Esophagogastroduodenoscopy (egd) with propofol N/A 11/19/2015    Procedure: ESOPHAGOGASTRODUODENOSCOPY (EGD) WITH PROPOFOL;  Surgeon: Christene Lye, MD;  Location: [facility] ENDOSCOPY;  Service: Endoscopy;  Laterality: N/A;    FAMILY HISTORY No significant family history of malignancy       ADVANCED DIRECTIVES:   Patient does have advanced healthcare directive HEALTH MAINTENANCE: Social History  Substance Use Topics  . Smoking status: Never Smoker   . Smokeless tobacco: None  . Alcohol Use: No      Allergies  Allergen Reactions  . Fentanyl Swelling    Swelling of lips  . Oxaliplatin Itching  . Hydrocodone-Acetaminophen Hives    Current Outpatient Prescriptions  Medication Sig Dispense Refill  . acetaminophen (TYLENOL) 500 MG tablet Take 500 mg by mouth every 6 (six) hours as needed.    Marland Kitchen HYDROmorphone (DILAUDID) 4 MG tablet Take 1 tablet (4 mg total) by mouth every 4 (four) hours as needed for severe pain. 60 tablet 0  . KLOR-CON 20 MEQ packet     . lidocaine (LIDODERM) 5 % Place 1 patch onto the skin daily. Remove & Discard patch within 12 hours or as directed by MD    . megestrol (MEGACE) 400 MG/10ML suspension Take 10 mLs (400 mg total) by mouth 2 (two) times daily. 240 mL 3  . omeprazole (PRILOSEC) 20 MG capsule Take 1 capsule (20 mg total) by mouth 2 (two) times daily before a meal. 60 capsule 5  . polyethylene glycol (MIRALAX / GLYCOLAX) packet Take 17 g by mouth daily. Reported on 11/19/2015    . predniSONE (DELTASONE) 20 MG tablet Take 1 tablet (20 mg  total) by mouth daily with breakfast. 30 tablet 3  . prochlorperazine (COMPAZINE) 10 MG tablet Take 1 tablet by mouth every 4-6 hours as needed for nausea, vomiting 30 tablet 3  . promethazine (PHENERGAN) 25 MG suppository Place 1 suppository (25 mg total) rectally every 6 (six) hours as needed for nausea or vomiting. 6 each 0  . traMADol (ULTRAM) 50 MG tablet Take 1 tablet (50 mg total) by mouth every morning. Take with tylenol 325 mg.tablet 15 tablet 1  . valACYclovir (VALTREX) 1000 MG tablet Take 1,000 mg by mouth 2 (two) times daily. Reported on 11/19/2015     No current facility-administered medications for this visit.   Facility-Administered Medications Ordered in Other Visits  Medication Dose Route Frequency Provider Last Rate Last Dose  . sodium chloride 0.9 % injection 10 mL  10 mL Intracatheter PRN [name], MD   10 mL at 03/21/15 1025  . sodium chloride 0.9 % injection 10 mL  10 mL Intravenous  PRN [name], MD   10 mL at 04/21/15 1025  . sodium chloride 0.9 % injection 10 mL  10 mL Intracatheter PRN [name], MD   10 mL at 12/03/15 1105    OBJECTIVE:  Filed Vitals:   12/13/15 1018  BP: 105/69  Pulse: 93  Temp: 98 F (36.7 C)  Resp: 18     Body mass index is 26.02 kg/(m^2).    ECOG FS:0 - Asymptomatic  PHYSICAL EXAM: GENERAL:  Well developed, well nourished, sitting comfortably in the exam room in no acute distress. MENTAL STATUS:  Alert and oriented to person, place and time. HEAD:  .  Normocephalic, atraumatic, face symmetric, no Cushingoid features. EYES:   Pupils equal round and reactive to light and accomodation.  No conjunctivitis or scleral icterus. ENT:  Oropharynx clear without lesion.  Tongue normal. Mucous membranes moist.  RESPIRATORY:  Clear to auscultation without rales, wheezes or rhonchi. CARDIOVASCULAR:  Regular rate and rhythm without murmur, rub or gallop. BREAST:  Right breast without masses, skin changes or nipple discharge.  Left breast  without masses, skin changes or nipple discharge. ABDOMEN:  Soft, non-tender, with active bowel sounds, and no hepatosplenomegaly.  No masses. BACK:  No CVA tenderness.  No tenderness on percussion of the back or rib cage. SKIN:  No rashes, ulcers or lesions. EXTREMITIES: No edema, no skin discoloration or tenderness.  No palpable cords. LYMPH NODES: No palpable cervical, supraclavicular, axillary or inguinal adenopathy  NEUROLOGICAL: Unremarkable. PSYCH:  Appropriate.   LAB RESULTS:  IMPRESSION: Shallow disc herniations at T7-8 and T8-9 that do not appear to cause neural compression but could be a cause of back pain.  Minimal degenerative changes also at T6-7, T9-10 and T10-11.  No evidence of metastatic disease to the spine. No evidence fracture or other pathologic process.  T2 bright areas in the liver. These may simply represent liver cysts, but there are not completely or properly evaluated by thoracic MRI.  Appointment on 12/13/2015  Component Date Value Ref Range Status  . WBC 12/13/2015 9.7  3.6 - 11.0 K/uL Final  . RBC 12/13/2015 3.85  3.80 - 5.20 MIL/uL Final  . Hemoglobin 12/13/2015 11.5* 12.0 - 16.0 g/dL Final  . HCT 12/13/2015 33.3* 35.0 - 47.0 % Final  . MCV 12/13/2015 86.5  80.0 - 100.0 fL Final  . MCH 12/13/2015 29.9  26.0 - 34.0 pg Final  . MCHC 12/13/2015 34.6  32.0 - 36.0 g/dL Final  . RDW 12/13/2015 19.1* 11.5 - 14.5 % Final  . Platelets 12/13/2015 253  150 - 440 K/uL Final  . Neutrophils Relative % 12/13/2015 72   Final  . Neutro Abs 12/13/2015 7.0* 1.4 - 6.5 K/uL Final  . Lymphocytes Relative 12/13/2015 19   Final  . Lymphs Abs 12/13/2015 1.8  1.0 - 3.6 K/uL Final  . Monocytes Relative 12/13/2015 8   Final  . Monocytes Absolute 12/13/2015 0.8  0.2 - 0.9 K/uL Final  . Eosinophils Relative 12/13/2015 1   Final  . Eosinophils Absolute 12/13/2015 0.1  0 - 0.7 K/uL Final  . Basophils Relative 12/13/2015 0   Final  . Basophils Absolute 12/13/2015 0.0  0  - 0.1 K/uL Final    No results found for: LABCA2 Lab Results  Component Value Date   CA199 <1 02/03/2014   Lab Results  Component Value Date   CEA 6.2* 11/12/2015   Component     Latest Ref Rng 01/25/2015 03/08/2015 03/21/2015 04/18/2015 05/23/2015  CEA  0.0 - 4.7 ng/mL 4.8 (H) 6.0 (H) 5.0 (H) 4.2 4.7   Component     Latest Ref Rng 06/13/2015  CEA     0.0 - 4.7 ng/mL 4.4          Electronically Signed  By: Abigail Miyamoto M.D.  On: 07/27/2015 10:33 ASSESSMENT:  1.  Carcinoma of colon stage IV disease progressing disease based on CT scan.  MRI scan does not show any evidence of bone metastases. Neck pain has been under control.  Does not have any diarrhea nausea vomiting CBC looks within normal Lab data is pending Will reduce 5-FU continuous infusion to 5 days because of patient's issue taking a bath As well as patient started developing some loose stool which has improved now Patient's potassium is 2.5 Intravenous 40 mEq of potassium was given intravenously  s   Staging form: Colon and Rectum, AJCC 7th Edition     Clinical: Stage IVB (yT3, N1, M1b) - Signed by [name], MD on 03/07/2015   [name], MD   12/13/2015 10:23 AM  [place] @ [place] [facility]

## 2015-12-14 ENCOUNTER — Ambulatory Visit
Admission: RE | Admit: 2015-12-14 | Discharge: 2015-12-14 | Disposition: A | Discharge status: Home / Self Care | Payer: 59 | Source: Ambulatory Visit | Attending: Radiation Oncology | Admitting: Radiation Oncology

## 2015-12-14 DIAGNOSIS — C185 Malignant neoplasm of splenic flexure: Secondary | ICD-10-CM | POA: Diagnosis not present

## 2015-12-17 ENCOUNTER — Ambulatory Visit
Admission: RE | Admit: 2015-12-17 | Discharge: 2015-12-17 | Disposition: A | Discharge status: Home / Self Care | Payer: 59 | Source: Ambulatory Visit | Attending: Radiation Oncology | Admitting: Radiation Oncology

## 2015-12-17 DIAGNOSIS — C185 Malignant neoplasm of splenic flexure: Secondary | ICD-10-CM | POA: Diagnosis not present

## 2015-12-18 ENCOUNTER — Inpatient Hospital Stay: Payer: 59

## 2015-12-18 ENCOUNTER — Ambulatory Visit
Admission: RE | Admit: 2015-12-18 | Discharge: 2015-12-18 | Disposition: A | Discharge status: Home / Self Care | Payer: 59 | Source: Ambulatory Visit | Attending: Radiation Oncology | Admitting: Radiation Oncology

## 2015-12-18 DIAGNOSIS — C189 Malignant neoplasm of colon, unspecified: Secondary | ICD-10-CM

## 2015-12-18 DIAGNOSIS — C185 Malignant neoplasm of splenic flexure: Secondary | ICD-10-CM | POA: Diagnosis not present

## 2015-12-18 MED ORDER — SODIUM CHLORIDE 0.9% FLUSH
10.0000 mL | Freq: Once | INTRAVENOUS | Status: AC
  Administered 2015-12-18: 10 mL via INTRAVENOUS
  Filled 2015-12-18: qty 10

## 2015-12-18 MED ORDER — HEPARIN SOD (PORK) LOCK FLUSH 100 UNIT/ML IV SOLN
500.0000 [IU] | Freq: Once | INTRAVENOUS | Status: AC
  Administered 2015-12-18: 500 [IU] via INTRAVENOUS
  Filled 2015-12-18: qty 5

## 2015-12-19 ENCOUNTER — Ambulatory Visit
Admission: RE | Admit: 2015-12-19 | Discharge: 2015-12-19 | Disposition: A | Discharge status: Home / Self Care | Payer: 59 | Source: Ambulatory Visit | Attending: Radiation Oncology | Admitting: Radiation Oncology

## 2015-12-19 DIAGNOSIS — C185 Malignant neoplasm of splenic flexure: Secondary | ICD-10-CM | POA: Diagnosis not present

## 2015-12-20 ENCOUNTER — Inpatient Hospital Stay: Payer: 59

## 2015-12-20 ENCOUNTER — Encounter: Payer: Self-pay | Admitting: Oncology

## 2015-12-20 ENCOUNTER — Ambulatory Visit
Admission: RE | Admit: 2015-12-20 | Discharge: 2015-12-20 | Disposition: A | Discharge status: Home / Self Care | Payer: 59 | Source: Ambulatory Visit | Attending: Radiation Oncology | Admitting: Radiation Oncology

## 2015-12-20 ENCOUNTER — Inpatient Hospital Stay (HOSPITAL_BASED_OUTPATIENT_CLINIC_OR_DEPARTMENT_OTHER): Payer: 59 | Admitting: Oncology

## 2015-12-20 VITALS — BP 122/67 | HR 82 | Temp 98.0°F | Resp 18 | Wt 152.6 lb

## 2015-12-20 DIAGNOSIS — R51 Headache: Secondary | ICD-10-CM

## 2015-12-20 DIAGNOSIS — C185 Malignant neoplasm of splenic flexure: Secondary | ICD-10-CM

## 2015-12-20 DIAGNOSIS — R630 Anorexia: Secondary | ICD-10-CM

## 2015-12-20 DIAGNOSIS — Z79899 Other long term (current) drug therapy: Secondary | ICD-10-CM

## 2015-12-20 DIAGNOSIS — C189 Malignant neoplasm of colon, unspecified: Secondary | ICD-10-CM

## 2015-12-20 DIAGNOSIS — R634 Abnormal weight loss: Secondary | ICD-10-CM

## 2015-12-20 DIAGNOSIS — E876 Hypokalemia: Secondary | ICD-10-CM | POA: Diagnosis not present

## 2015-12-20 DIAGNOSIS — R599 Enlarged lymph nodes, unspecified: Secondary | ICD-10-CM

## 2015-12-20 DIAGNOSIS — R5383 Other fatigue: Secondary | ICD-10-CM

## 2015-12-20 DIAGNOSIS — C787 Secondary malignant neoplasm of liver and intrahepatic bile duct: Secondary | ICD-10-CM

## 2015-12-20 DIAGNOSIS — R197 Diarrhea, unspecified: Secondary | ICD-10-CM

## 2015-12-20 DIAGNOSIS — Z7952 Long term (current) use of systemic steroids: Secondary | ICD-10-CM

## 2015-12-20 DIAGNOSIS — G893 Neoplasm related pain (acute) (chronic): Secondary | ICD-10-CM

## 2015-12-20 LAB — CBC WITH DIFFERENTIAL/PLATELET
Basophils Absolute: 0 10*3/uL (ref 0–0.1)
Basophils Relative: 0 %
EOS PCT: 3 %
Eosinophils Absolute: 0.2 10*3/uL (ref 0–0.7)
HCT: 30.4 % — ABNORMAL LOW (ref 35.0–47.0)
Hemoglobin: 10.5 g/dL — ABNORMAL LOW (ref 12.0–16.0)
LYMPHS ABS: 0.8 10*3/uL — AB (ref 1.0–3.6)
LYMPHS PCT: 15 %
MCH: 30.8 pg (ref 26.0–34.0)
MCHC: 34.6 g/dL (ref 32.0–36.0)
MCV: 89.1 fL (ref 80.0–100.0)
MONO ABS: 0.6 10*3/uL (ref 0.2–0.9)
Monocytes Relative: 10 %
Neutro Abs: 4 10*3/uL (ref 1.4–6.5)
Neutrophils Relative %: 72 %
PLATELETS: 203 10*3/uL (ref 150–440)
RBC: 3.41 MIL/uL — ABNORMAL LOW (ref 3.80–5.20)
RDW: 19.2 % — AB (ref 11.5–14.5)
WBC: 5.6 10*3/uL (ref 3.6–11.0)

## 2015-12-20 LAB — COMPREHENSIVE METABOLIC PANEL
ALT: 43 U/L (ref 14–54)
AST: 26 U/L (ref 15–41)
Albumin: 3.5 g/dL (ref 3.5–5.0)
Alkaline Phosphatase: 55 U/L (ref 38–126)
Anion gap: 8 (ref 5–15)
BUN: <5 mg/dL — ABNORMAL LOW (ref 6–20)
CHLORIDE: 101 mmol/L (ref 101–111)
CO2: 26 mmol/L (ref 22–32)
Calcium: 8.9 mg/dL (ref 8.9–10.3)
Creatinine, Ser: 0.54 mg/dL (ref 0.44–1.00)
Glucose, Bld: 119 mg/dL — ABNORMAL HIGH (ref 65–99)
POTASSIUM: 2.5 mmol/L — AB (ref 3.5–5.1)
Sodium: 135 mmol/L (ref 135–145)
Total Bilirubin: 1.1 mg/dL (ref 0.3–1.2)
Total Protein: 6.5 g/dL (ref 6.5–8.1)

## 2015-12-20 LAB — MAGNESIUM: MAGNESIUM: 1.8 mg/dL (ref 1.7–2.4)

## 2015-12-20 MED ORDER — SODIUM CHLORIDE 0.9 % IV SOLN
Freq: Once | INTRAVENOUS | Status: AC
  Administered 2015-12-20: 12:00:00 via INTRAVENOUS
  Filled 2015-12-20: qty 100

## 2015-12-20 MED ORDER — SODIUM CHLORIDE 0.9 % IV SOLN
200.0000 mg/m2/d | INTRAVENOUS | Status: DC
  Administered 2015-12-20: 1800 mg via INTRAVENOUS
  Filled 2015-12-20: qty 36

## 2015-12-20 NOTE — Progress Notes (Signed)
[place] @ [facility] Telephone:([phone]  Fax:(336) [place] OB: 03/13/1971  MR#: [id number]  JAS#:[id number]  Patient Care Team: Lynnell Jude, MD as PCP - General (Family Medicine) [name], MD (Oncology) Seeplaputhur [name], MD (General Surgery)  CHIEF COMPLAINT:  Chief Complaint  Patient presents with  . Colon Cancer   Oncology History   1. Carcinoma of the colon, splenic  flexure.   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 2. K-RAS mutated. 3. Patient started on FOLFOX and Avastin 4 th June, 2015 4. Allergic reaction  characterized by generalized itching and redness  of face and palm on June 15, 2014 5. Started on FOLFIRI  and avastin   July 13, 2014 6. Finished 12 cycles of chemotherapy, 5-FU based FOLFOX was changed to FOLFIRI BECAUSE OF SENSITIVITY TO OXALIPLATIN(September 07, 2014) 7. Started on maintenance chemotherapy with 5-FU leucovorin and Avastin from September 28, 2014 8.  Progressive disease based on tumor markers as well as PET scan's so patient was started on FOLFOX chemotherapy with desensitizing  protocol.   9.  Progressing disease by CT scan and by symptoms.  Patient will be started on TAS-102 from October, 2016 10.  Progressing disease on  TAS-102 (December, 2016) 11.patient has been started on palliative radiation therapy to retroperitoneal mass and 5-FU by continuous infusion (December 03, 2015 12.  Patient started in 5-FU by continuous infusion and radiation therapy (February, 2017)      INTERVAL HISTORY: \  45 year old lady with progressing colon cancer.  Losing weight.  Appetite is poor. Dilaudid is helping with the pain without any significant side effect. Patient had upper endoscopy at discussed situation with Dr. Conception Oms core biopsy of ulcerative lesion is negative for any malignancy. Patient was supposed to take S2  blocker medication but she did not take that Patient is somewhat drowsy. An MRI scan here to discuss the results and further planning of treatment Poor appetite and declining condition and had upper endoscopy did not reveal any evidence of ulcers or malignancy. Pain is somewhat better controlled with Dilaudid but still continues to have mild distress Oral intake is still poor. Patient is finishing up radiation therapy next 2 weeks here for further evaluation regarding 5-FU chemotherapy. Patient is also severely hypokalemic with potassium of 2.5      REVIEW OF SYSTEMS:   GENERAL:  Feels good.  Active.  No fevers, sweats or weight loss. PERFORMANCE STATUS (ECOG): 0 HEENT:  No visual changes, runny nose, sore throat, mouth sores or tenderness.  Mild headache Visual disturbances as described about Lungs: No shortness of breath or cough.  No hemoptysis. Cardiac:  No chest pain, palpitations, orthopnea, or PND. GI: Nausea following chemotherapy lasting for more than 24 hours which is now resolved.  No diarrhea. GU:  No urgency, frequency, dysuria, or hematuria. Musculoskeletal:  Back pain as described above Extremities:  No pain or swelling.  Pain in the left hip after fall.took motrn Skin:  No rashes or skin changes. Neuro:  No headache, numbness or weakness, balance or coordination issues. Endocrine:  No diabetes, thyroid issues, hot flashes or night sweats. Psych:  No mood changes, depression or anxiety. Pain:  No focal pain. Review of systems:  All other systems reviewed and found to be negative.  As per HPI. Otherwise, a complete review of systems is negatve.  PAST MEDICAL  HISTORY: Past Medical History  Diagnosis Date  . Ulcer   . Anemia   . Obesity   . Constipation   . Migraines   . Cancer ([place]) 2015    colon  . Colon cancer metastasized to liver [facility]) April 2015    T3, N2, M1, stage IV with biopsy-proven liver metastases. On adjuvant chemotherapy    PAST SURGICAL  HISTORY: Past Surgical History  Procedure Laterality Date  . Cholecystectomy  2014  . Tubal ligation  2005  . Colonoscopy  2015  . Upper gi endoscopy  2015  . Esophagogastroduodenoscopy (egd) with propofol N/A 11/19/2015    Procedure: ESOPHAGOGASTRODUODENOSCOPY (EGD) WITH PROPOFOL;  Surgeon: Christene Lye, MD;  Location: [facility] ENDOSCOPY;  Service: Endoscopy;  Laterality: N/A;    FAMILY HISTORY No significant family history of malignancy       ADVANCED DIRECTIVES:   Patient does have advanced healthcare directive HEALTH MAINTENANCE: Social History  Substance Use Topics  . Smoking status: Never Smoker   . Smokeless tobacco: None  . Alcohol Use: No      Allergies  Allergen Reactions  . Fentanyl Swelling    Swelling of lips  . Oxaliplatin Itching  . Hydrocodone-Acetaminophen Hives    Current Outpatient Prescriptions  Medication Sig Dispense Refill  . acetaminophen (TYLENOL) 500 MG tablet Take 500 mg by mouth every 6 (six) hours as needed.    Marland Kitchen HYDROmorphone (DILAUDID) 4 MG tablet Take 1 tablet (4 mg total) by mouth every 4 (four) hours as needed for severe pain. 60 tablet 0  . KLOR-CON 20 MEQ packet     . lidocaine (LIDODERM) 5 % Place 1 patch onto the skin daily. Remove & Discard patch within 12 hours or as directed by MD    . megestrol (MEGACE) 400 MG/10ML suspension Take 10 mLs (400 mg total) by mouth 2 (two) times daily. 240 mL 3  . omeprazole (PRILOSEC) 20 MG capsule Take 1 capsule (20 mg total) by mouth 2 (two) times daily before a meal. 60 capsule 5  . polyethylene glycol (MIRALAX / GLYCOLAX) packet Take 17 g by mouth daily. Reported on 11/19/2015    . predniSONE (DELTASONE) 20 MG tablet Take 1 tablet (20 mg total) by mouth daily with breakfast. 30 tablet 3  . prochlorperazine (COMPAZINE) 10 MG tablet Take 1 tablet by mouth every 4-6 hours as needed for nausea, vomiting 30 tablet 3  . promethazine (PHENERGAN) 25 MG suppository Place 1 suppository (25 mg total)  rectally every 6 (six) hours as needed for nausea or vomiting. 6 each 0  . traMADol (ULTRAM) 50 MG tablet Take 1 tablet (50 mg total) by mouth every morning. Take with tylenol 325 mg.tablet 15 tablet 1  . valACYclovir (VALTREX) 1000 MG tablet Take 1,000 mg by mouth 2 (two) times daily. Reported on 11/19/2015     No current facility-administered medications for this visit.   Facility-Administered Medications Ordered in Other Visits  Medication Dose Route Frequency Provider Last Rate Last Dose  . fluorouracil (ADRUCIL) 1,800 mg in sodium chloride 0.9 % 114 mL chemo infusion  200 mg/m2/day (Treatment Plan Actual) Intravenous 5 days [name], MD   1,800 mg at 12/20/15 1239  . sodium chloride 0.9 % injection 10 mL  10 mL Intracatheter PRN [name], MD   10 mL at 03/21/15 1025  . sodium chloride 0.9 % injection 10 mL  10 mL Intravenous PRN [name], MD   10 mL at 04/21/15 1025  . sodium chloride  0.9 % injection 10 mL  10 mL Intracatheter PRN [name], MD   10 mL at 12/03/15 1105    OBJECTIVE:  Filed Vitals:   12/20/15 1039  BP: 122/67  Pulse: 82  Temp: 98 F (36.7 C)  Resp: 18     Body mass index is 26.17 kg/(m^2).    ECOG FS:0 - Asymptomatic  PHYSICAL EXAM: GENERAL:  Well developed, well nourished, sitting comfortably in the exam room in no acute distress. MENTAL STATUS:  Alert and oriented to person, place and time. HEAD:  .  Normocephalic, atraumatic, face symmetric, no Cushingoid features. EYES:   Pupils equal round and reactive to light and accomodation.  No conjunctivitis or scleral icterus. ENT:  Oropharynx clear without lesion.  Tongue normal. Mucous membranes moist.  RESPIRATORY:  Clear to auscultation without rales, wheezes or rhonchi. CARDIOVASCULAR:  Regular rate and rhythm without murmur, rub or gallop. BREAST:  Right breast without masses, skin changes or nipple discharge.  Left breast without masses, skin changes or nipple discharge. ABDOMEN:  Soft,  non-tender, with active bowel sounds, and no hepatosplenomegaly.  No masses. BACK:  No CVA tenderness.  No tenderness on percussion of the back or rib cage. SKIN:  No rashes, ulcers or lesions. EXTREMITIES: No edema, no skin discoloration or tenderness.  No palpable cords. LYMPH NODES: No palpable cervical, supraclavicular, axillary or inguinal adenopathy  NEUROLOGICAL: Unremarkable. PSYCH:  Appropriate.     Appointment on 12/20/2015  Component Date Value Ref Range Status  . WBC 12/20/2015 5.6  3.6 - 11.0 K/uL Final  . RBC 12/20/2015 3.41* 3.80 - 5.20 MIL/uL Final  . Hemoglobin 12/20/2015 10.5* 12.0 - 16.0 g/dL Final  . HCT 12/20/2015 30.4* 35.0 - 47.0 % Final  . MCV 12/20/2015 89.1  80.0 - 100.0 fL Final  . MCH 12/20/2015 30.8  26.0 - 34.0 pg Final  . MCHC 12/20/2015 34.6  32.0 - 36.0 g/dL Final  . RDW 12/20/2015 19.2* 11.5 - 14.5 % Final  . Platelets 12/20/2015 203  150 - 440 K/uL Final  . Neutrophils Relative % 12/20/2015 72   Final  . Neutro Abs 12/20/2015 4.0  1.4 - 6.5 K/uL Final  . Lymphocytes Relative 12/20/2015 15   Final  . Lymphs Abs 12/20/2015 0.8* 1.0 - 3.6 K/uL Final  . Monocytes Relative 12/20/2015 10   Final  . Monocytes Absolute 12/20/2015 0.6  0.2 - 0.9 K/uL Final  . Eosinophils Relative 12/20/2015 3   Final  . Eosinophils Absolute 12/20/2015 0.2  0 - 0.7 K/uL Final  . Basophils Relative 12/20/2015 0   Final  . Basophils Absolute 12/20/2015 0.0  0 - 0.1 K/uL Final  . Sodium 12/20/2015 135  135 - 145 mmol/L Final  . Potassium 12/20/2015 2.5* 3.5 - 5.1 mmol/L Final   Comment: RESULTS VERIFIED BY REPEAT TESTING CRITICAL RESULT CALLED TO, READ BACK BY AND VERIFIED WITH HAYLEY RHODE AT 5852 12/20/2015 KMR   . Chloride 12/20/2015 101  101 - 111 mmol/L Final  . CO2 12/20/2015 26  22 - 32 mmol/L Final  . Glucose, Bld 12/20/2015 119* 65 - 99 mg/dL Final  . BUN 12/20/2015 <5* 6 - 20 mg/dL Final  . Creatinine, Ser 12/20/2015 0.54  0.44 - 1.00 mg/dL Final  . Calcium  12/20/2015 8.9  8.9 - 10.3 mg/dL Final  . Total Protein 12/20/2015 6.5  6.5 - 8.1 g/dL Final  . Albumin 12/20/2015 3.5  3.5 - 5.0 g/dL Final  . AST 12/20/2015 26  15 -  41 U/L Final  . ALT 12/20/2015 43  14 - 54 U/L Final  . Alkaline Phosphatase 12/20/2015 55  38 - 126 U/L Final  . Total Bilirubin 12/20/2015 1.1  0.3 - 1.2 mg/dL Final  . GFR calc non Af Amer 12/20/2015 >60  >60 mL/min Final  . GFR calc Af Amer 12/20/2015 >60  >60 mL/min Final   Comment: (NOTE) The eGFR has been calculated using the CKD EPI equation. This calculation has not been validated in all clinical situations. eGFR's persistently <60 mL/min signify possible Chronic Kidney Disease.   . Anion gap 12/20/2015 8  5 - 15 Final  . Magnesium 12/20/2015 1.8  1.7 - 2.4 mg/dL Final    No results found for: LABCA2 Lab Results  Component Value Date   CA199 <1 02/03/2014   Lab Results  Component Value Date   CEA 6.2* 11/12/2015   Component     Latest Ref Rng 01/25/2015 03/08/2015 03/21/2015 04/18/2015 05/23/2015  CEA     0.0 - 4.7 ng/mL 4.8 (H) 6.0 (H) 5.0 (H) 4.2 4.7   Component     Latest Ref Rng 06/13/2015  CEA     0.0 - 4.7 ng/mL 4.4          Electronically Signed  By: Abigail Miyamoto M.D.  On: 07/27/2015 10:33 ASSESSMENT:  1.  Carcinoma of colon stage IV disease progressing disease based on CT scan.  MRI scan does not show any evidence of bone metastases. Neck pain has been under control.  Does not have any diarrhea nausea vomiting CBC looks within normal Lab data is pending Will reduce 5-FU continuous infusion to 5 days because of patient's issue taking a bath As well as patient started developing some loose stool which has improved now Patient's potassium is 2.5 Intravenous 40 mEq of potassium was given intravenously Magnesium is 1.8  Also proceed with Guidant study to see there is any further change in the mutation and see if she eligible for any available protocol    Staging form: Colon and  Rectum, AJCC 7th Edition     Clinical: Stage IVB (yT3, N1, M1b) - Signed by [name], MD on 03/07/2015   [name], MD   12/20/2015 1:53 PM  [place] @ [facility]

## 2015-12-21 ENCOUNTER — Ambulatory Visit
Admission: RE | Admit: 2015-12-21 | Discharge: 2015-12-21 | Disposition: A | Discharge status: Home / Self Care | Payer: 59 | Source: Ambulatory Visit | Attending: Radiation Oncology | Admitting: Radiation Oncology

## 2015-12-21 ENCOUNTER — Inpatient Hospital Stay: Payer: 59

## 2015-12-21 DIAGNOSIS — C189 Malignant neoplasm of colon, unspecified: Secondary | ICD-10-CM

## 2015-12-21 DIAGNOSIS — C185 Malignant neoplasm of splenic flexure: Secondary | ICD-10-CM | POA: Diagnosis not present

## 2015-12-21 MED ORDER — SODIUM CHLORIDE 0.9 % IV SOLN
Freq: Once | INTRAVENOUS | Status: AC
  Administered 2015-12-21: 10:00:00 via INTRAVENOUS
  Filled 2015-12-21: qty 250

## 2015-12-24 ENCOUNTER — Ambulatory Visit
Admission: RE | Admit: 2015-12-24 | Discharge: 2015-12-24 | Disposition: A | Discharge status: Home / Self Care | Payer: 59 | Source: Ambulatory Visit | Attending: Radiation Oncology | Admitting: Radiation Oncology

## 2015-12-24 DIAGNOSIS — C185 Malignant neoplasm of splenic flexure: Secondary | ICD-10-CM | POA: Diagnosis not present

## 2015-12-25 ENCOUNTER — Inpatient Hospital Stay: Payer: 59

## 2015-12-25 ENCOUNTER — Ambulatory Visit
Admission: RE | Admit: 2015-12-25 | Discharge: 2015-12-25 | Disposition: A | Discharge status: Home / Self Care | Payer: 59 | Source: Ambulatory Visit | Attending: Radiation Oncology | Admitting: Radiation Oncology

## 2015-12-25 ENCOUNTER — Telehealth: Payer: Self-pay | Admitting: Pharmacist

## 2015-12-25 DIAGNOSIS — C189 Malignant neoplasm of colon, unspecified: Secondary | ICD-10-CM

## 2015-12-25 DIAGNOSIS — C185 Malignant neoplasm of splenic flexure: Secondary | ICD-10-CM | POA: Diagnosis not present

## 2015-12-25 MED ORDER — SODIUM CHLORIDE 0.9% FLUSH
10.0000 mL | Freq: Once | INTRAVENOUS | Status: AC
  Administered 2015-12-25: 10 mL via INTRAVENOUS
  Filled 2015-12-25: qty 10

## 2015-12-25 MED ORDER — HEPARIN SOD (PORK) LOCK FLUSH 100 UNIT/ML IV SOLN
500.0000 [IU] | Freq: Once | INTRAVENOUS | Status: AC
  Administered 2015-12-25: 500 [IU] via INTRAVENOUS

## 2015-12-25 MED ORDER — HEPARIN SOD (PORK) LOCK FLUSH 100 UNIT/ML IV SOLN
INTRAVENOUS | Status: AC
  Filled 2015-12-25: qty 5

## 2015-12-25 NOTE — Telephone Encounter (Signed)
Patient returned to clinic for pump removal. There was 52ml left in pump bag. Spoke with physician. He said discontinue pump and patient will return on Thursday for new pump placement. Rate had not been changed from 0.30ml to 1.51ml. At one time it was a 7 day pump and it has now changed to a 5 day pump.

## 2015-12-26 ENCOUNTER — Ambulatory Visit
Admission: RE | Admit: 2015-12-26 | Discharge: 2015-12-26 | Disposition: A | Discharge status: Home / Self Care | Payer: 59 | Source: Ambulatory Visit | Attending: Radiation Oncology | Admitting: Radiation Oncology

## 2015-12-26 DIAGNOSIS — C185 Malignant neoplasm of splenic flexure: Secondary | ICD-10-CM | POA: Diagnosis not present

## 2015-12-27 ENCOUNTER — Ambulatory Visit
Admission: RE | Admit: 2015-12-27 | Discharge: 2015-12-27 | Disposition: A | Discharge status: Home / Self Care | Payer: 59 | Source: Ambulatory Visit | Attending: Radiation Oncology | Admitting: Radiation Oncology

## 2015-12-27 ENCOUNTER — Encounter: Payer: Self-pay | Admitting: Oncology

## 2015-12-27 ENCOUNTER — Inpatient Hospital Stay (HOSPITAL_BASED_OUTPATIENT_CLINIC_OR_DEPARTMENT_OTHER): Payer: 59 | Admitting: Oncology

## 2015-12-27 ENCOUNTER — Inpatient Hospital Stay: Payer: 59

## 2015-12-27 ENCOUNTER — Inpatient Hospital Stay: Payer: 59 | Attending: Oncology

## 2015-12-27 VITALS — BP 108/73 | HR 93 | Temp 97.9°F | Resp 18 | Wt 148.1 lb

## 2015-12-27 DIAGNOSIS — R630 Anorexia: Secondary | ICD-10-CM

## 2015-12-27 DIAGNOSIS — R112 Nausea with vomiting, unspecified: Secondary | ICD-10-CM | POA: Diagnosis not present

## 2015-12-27 DIAGNOSIS — M545 Low back pain: Secondary | ICD-10-CM | POA: Diagnosis not present

## 2015-12-27 DIAGNOSIS — E860 Dehydration: Secondary | ICD-10-CM | POA: Diagnosis not present

## 2015-12-27 DIAGNOSIS — C189 Malignant neoplasm of colon, unspecified: Secondary | ICD-10-CM

## 2015-12-27 DIAGNOSIS — Z931 Gastrostomy status: Secondary | ICD-10-CM | POA: Insufficient documentation

## 2015-12-27 DIAGNOSIS — Z888 Allergy status to other drugs, medicaments and biological substances status: Secondary | ICD-10-CM

## 2015-12-27 DIAGNOSIS — Z9221 Personal history of antineoplastic chemotherapy: Secondary | ICD-10-CM | POA: Insufficient documentation

## 2015-12-27 DIAGNOSIS — Z79899 Other long term (current) drug therapy: Secondary | ICD-10-CM

## 2015-12-27 DIAGNOSIS — E876 Hypokalemia: Secondary | ICD-10-CM | POA: Diagnosis not present

## 2015-12-27 DIAGNOSIS — R000 Tachycardia, unspecified: Secondary | ICD-10-CM | POA: Insufficient documentation

## 2015-12-27 DIAGNOSIS — C787 Secondary malignant neoplasm of liver and intrahepatic bile duct: Secondary | ICD-10-CM

## 2015-12-27 DIAGNOSIS — Z923 Personal history of irradiation: Secondary | ICD-10-CM | POA: Insufficient documentation

## 2015-12-27 DIAGNOSIS — D72829 Elevated white blood cell count, unspecified: Secondary | ICD-10-CM | POA: Insufficient documentation

## 2015-12-27 DIAGNOSIS — C185 Malignant neoplasm of splenic flexure: Secondary | ICD-10-CM | POA: Diagnosis not present

## 2015-12-27 DIAGNOSIS — Z9889 Other specified postprocedural states: Secondary | ICD-10-CM | POA: Insufficient documentation

## 2015-12-27 DIAGNOSIS — R110 Nausea: Secondary | ICD-10-CM | POA: Insufficient documentation

## 2015-12-27 DIAGNOSIS — R5383 Other fatigue: Secondary | ICD-10-CM | POA: Insufficient documentation

## 2015-12-27 DIAGNOSIS — Z5111 Encounter for antineoplastic chemotherapy: Secondary | ICD-10-CM | POA: Insufficient documentation

## 2015-12-27 DIAGNOSIS — M542 Cervicalgia: Secondary | ICD-10-CM | POA: Insufficient documentation

## 2015-12-27 DIAGNOSIS — R197 Diarrhea, unspecified: Secondary | ICD-10-CM

## 2015-12-27 LAB — CBC WITH DIFFERENTIAL/PLATELET
BASOS PCT: 0 %
Basophils Absolute: 0 10*3/uL (ref 0–0.1)
EOS ABS: 0.1 10*3/uL (ref 0–0.7)
Eosinophils Relative: 2 %
HEMATOCRIT: 30.4 % — AB (ref 35.0–47.0)
HEMOGLOBIN: 10.5 g/dL — AB (ref 12.0–16.0)
LYMPHS ABS: 0.7 10*3/uL — AB (ref 1.0–3.6)
Lymphocytes Relative: 12 %
MCH: 31.5 pg (ref 26.0–34.0)
MCHC: 34.4 g/dL (ref 32.0–36.0)
MCV: 91.4 fL (ref 80.0–100.0)
Monocytes Absolute: 0.6 10*3/uL (ref 0.2–0.9)
Monocytes Relative: 11 %
NEUTROS ABS: 4.4 10*3/uL (ref 1.4–6.5)
NEUTROS PCT: 75 %
Platelets: 215 10*3/uL (ref 150–440)
RBC: 3.32 MIL/uL — AB (ref 3.80–5.20)
RDW: 20.5 % — ABNORMAL HIGH (ref 11.5–14.5)
WBC: 5.8 10*3/uL (ref 3.6–11.0)

## 2015-12-27 LAB — COMPREHENSIVE METABOLIC PANEL
ALBUMIN: 3.4 g/dL — AB (ref 3.5–5.0)
ALK PHOS: 64 U/L (ref 38–126)
ALT: 15 U/L (ref 14–54)
AST: 17 U/L (ref 15–41)
Anion gap: 8 (ref 5–15)
CO2: 25 mmol/L (ref 22–32)
CREATININE: 0.47 mg/dL (ref 0.44–1.00)
Calcium: 8.7 mg/dL — ABNORMAL LOW (ref 8.9–10.3)
Chloride: 98 mmol/L — ABNORMAL LOW (ref 101–111)
GFR calc Af Amer: >60 mL/min (ref >60)
GFR calc non Af Amer: >60 mL/min (ref >60)
GLUCOSE: 124 mg/dL — AB (ref 65–99)
Potassium: 2.4 mmol/L — CL (ref 3.5–5.1)
SODIUM: 131 mmol/L — AB (ref 135–145)
TOTAL PROTEIN: 7.1 g/dL (ref 6.5–8.1)
Total Bilirubin: 0.9 mg/dL (ref 0.3–1.2)

## 2015-12-27 LAB — MAGNESIUM: Magnesium: 1.6 mg/dL — ABNORMAL LOW (ref 1.7–2.4)

## 2015-12-27 MED ORDER — HEPARIN SOD (PORK) LOCK FLUSH 100 UNIT/ML IV SOLN: 500.0000 [IU] | Freq: Once | INTRAVENOUS | Status: DC | PRN

## 2015-12-27 MED ORDER — HYDROMORPHONE HCL 4 MG PO TABS: 4.0000 mg | ORAL_TABLET | ORAL | Status: DC | PRN

## 2015-12-27 MED ORDER — SODIUM CHLORIDE 0.9 % IV SOLN
Freq: Once | INTRAVENOUS | Status: AC
  Administered 2015-12-27: 13:00:00 via INTRAVENOUS
  Filled 2015-12-27: qty 1000

## 2015-12-27 MED ORDER — SODIUM CHLORIDE 0.9 % IV SOLN
200.0000 mg/m2/d | INTRAVENOUS | Status: DC
  Administered 2015-12-27: 1800 mg via INTRAVENOUS
  Filled 2015-12-27: qty 36

## 2015-12-27 MED ORDER — SODIUM CHLORIDE 0.9% FLUSH
10.0000 mL | INTRAVENOUS | Status: DC | PRN
  Administered 2015-12-27: 10 mL
  Filled 2015-12-27: qty 10

## 2015-12-27 MED ORDER — SODIUM CHLORIDE 0.9 % IV SOLN
Freq: Once | INTRAVENOUS | Status: AC
  Administered 2015-12-27: 13:00:00 via INTRAVENOUS
  Filled 2015-12-27: qty 250

## 2015-12-27 NOTE — Progress Notes (Signed)
Patient states she still has no appetite.  Only eating a few bites of food each day.

## 2015-12-27 NOTE — Progress Notes (Signed)
MD aware of potassium 2.4 and mag 1.6

## 2015-12-27 NOTE — Progress Notes (Signed)
[place] @ [facility] [phone]) [phone]  Fax:(336) [place] OB: 1971/02/14  MR#: [id number]  YKZ#:[id number]  Patient Care Team: Lynnell Jude, MD as PCP - General (Family Medicine) [name], MD (Oncology) Seeplaputhur [name], MD (General Surgery)  CHIEF COMPLAINT:  Chief Complaint  Patient presents with  . Colon Cancer   Oncology History   1. Carcinoma of the colon, splenic  flexure.   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 2. K-RAS mutated. 3. Patient started on FOLFOX and Avastin 4 th June, 2015 4. Allergic reaction  characterized by generalized itching and redness  of face and palm on June 15, 2014 5. Started on FOLFIRI  and avastin   July 13, 2014 6. Finished 12 cycles of chemotherapy, 5-FU based FOLFOX was changed to FOLFIRI BECAUSE OF SENSITIVITY TO OXALIPLATIN(September 07, 2014) 7. Started on maintenance chemotherapy with 5-FU leucovorin and Avastin from September 28, 2014 8.  Progressive disease based on tumor markers as well as PET scan's so patient was started on FOLFOX chemotherapy with desensitizing  protocol.   9.  Progressing disease by CT scan and by symptoms.  Patient will be started on TAS-102 from October, 2016 10.  Progressing disease on  TAS-102 (December, 2016) 11.patient has been started on palliative radiation therapy to retroperitoneal mass and 5-FU by continuous infusion (December 03, 2015 12.  Patient started in 5-FU by continuous infusion and radiation therapy (February, 2017)      INTERVAL HISTORY: \  45 year old lady with progressing colon cancer.  Losing weight.  Appetite is poor. Dilaudid is helping with the pain without any significant side effect.    Poor appetite and declining condition and had upper endoscopy did not reveal any evidence of ulcers or malignancy. Pain is somewhat better controlled with  Dilaudid but still continues to have mild distress Oral intake is still poor. Patient is tolerating chemotherapy without any significant stomatitis or diarrhea. Pain is under better control.      REVIEW OF SYSTEMS:   GENERAL:  Feels good.  Active.  No fevers, sweats or weight loss. PERFORMANCE STATUS (ECOG): 0 HEENT:  No visual changes, runny nose, sore throat, mouth sores or tenderness.  Mild headache Visual disturbances as described about Lungs: No shortness of breath or cough.  No hemoptysis. Cardiac:  No chest pain, palpitations, orthopnea, or PND. GI: Nausea following chemotherapy lasting for more than 24 hours which is now resolved.  No diarrhea. GU:  No urgency, frequency, dysuria, or hematuria. Musculoskeletal:  Back pain as described above Extremities:  No pain or swelling.  Pain in the left hip after fall.took motrn Skin:  No rashes or skin changes. Neuro:  No headache, numbness or weakness, balance or coordination issues. Endocrine:  No diabetes, thyroid issues, hot flashes or night sweats. Psych:  No mood changes, depression or anxiety. Pain:  No focal pain. Review of systems:  All other systems reviewed and found to be negative.  As per HPI. Otherwise, a complete review of systems is negatve.  PAST MEDICAL HISTORY: Past Medical History  Diagnosis Date  . Ulcer   . Anemia   . Obesity   . Constipation   . Migraines   . Cancer ([place]) 2015    colon  . Colon cancer metastasized to liver [facility]) April 2015    T3, N2, M1, stage IV with biopsy-proven liver metastases.  On adjuvant chemotherapy    PAST SURGICAL HISTORY: Past Surgical History  Procedure Laterality Date  . Cholecystectomy  2014  . Tubal ligation  2005  . Colonoscopy  2015  . Upper gi endoscopy  2015  . Esophagogastroduodenoscopy (egd) with propofol N/A 11/19/2015    Procedure: ESOPHAGOGASTRODUODENOSCOPY (EGD) WITH PROPOFOL;  Surgeon: Christene Lye, MD;  Location: [facility] ENDOSCOPY;  Service:  Endoscopy;  Laterality: N/A;    FAMILY HISTORY No significant family history of malignancy       ADVANCED DIRECTIVES:   Patient does have advanced healthcare directive HEALTH MAINTENANCE: Social History  Substance Use Topics  . Smoking status: Never Smoker   . Smokeless tobacco: None  . Alcohol Use: No      Allergies  Allergen Reactions  . Fentanyl Swelling    Swelling of lips  . Oxaliplatin Itching  . Hydrocodone-Acetaminophen Hives    Current Outpatient Prescriptions  Medication Sig Dispense Refill  . acetaminophen (TYLENOL) 500 MG tablet Take 500 mg by mouth every 6 (six) hours as needed.    Marland Kitchen HYDROmorphone (DILAUDID) 4 MG tablet Take 1 tablet (4 mg total) by mouth every 4 (four) hours as needed for severe pain. 60 tablet 0  . KLOR-CON 20 MEQ packet     . lidocaine (LIDODERM) 5 % Place 1 patch onto the skin daily. Remove & Discard patch within 12 hours or as directed by MD    . megestrol (MEGACE) 400 MG/10ML suspension Take 10 mLs (400 mg total) by mouth 2 (two) times daily. 240 mL 3  . omeprazole (PRILOSEC) 20 MG capsule Take 1 capsule (20 mg total) by mouth 2 (two) times daily before a meal. 60 capsule 5  . polyethylene glycol (MIRALAX / GLYCOLAX) packet Take 17 g by mouth daily. Reported on 11/19/2015    . predniSONE (DELTASONE) 20 MG tablet Take 1 tablet (20 mg total) by mouth daily with breakfast. 30 tablet 3  . prochlorperazine (COMPAZINE) 10 MG tablet Take 1 tablet by mouth every 4-6 hours as needed for nausea, vomiting 30 tablet 3  . promethazine (PHENERGAN) 25 MG suppository Place 1 suppository (25 mg total) rectally every 6 (six) hours as needed for nausea or vomiting. 6 each 0  . traMADol (ULTRAM) 50 MG tablet Take 1 tablet (50 mg total) by mouth every morning. Take with tylenol 325 mg.tablet 15 tablet 1  . valACYclovir (VALTREX) 1000 MG tablet Take 1,000 mg by mouth 2 (two) times daily. Reported on 11/19/2015     No current facility-administered medications  for this visit.   Facility-Administered Medications Ordered in Other Visits  Medication Dose Route Frequency Provider Last Rate Last Dose  . sodium chloride 0.9 % injection 10 mL  10 mL Intracatheter PRN [name], MD   10 mL at 03/21/15 1025  . sodium chloride 0.9 % injection 10 mL  10 mL Intravenous PRN [name], MD   10 mL at 04/21/15 1025  . sodium chloride 0.9 % injection 10 mL  10 mL Intracatheter PRN [name], MD   10 mL at 12/03/15 1105    OBJECTIVE:  Filed Vitals:   12/27/15 1056  BP: 108/73  Pulse: 93  Temp: 97.9 F (36.6 C)  Resp: 18     Body mass index is 25.42 kg/(m^2).    ECOG FS:0 - Asymptomatic  PHYSICAL EXAM: GENERAL:  Well developed, well nourished, sitting comfortably in the exam room in no acute distress. MENTAL STATUS:  Alert and oriented to person, place and  time. HEAD:  .  Normocephalic, atraumatic, face symmetric, no Cushingoid features. EYES:   Pupils equal round and reactive to light and accomodation.  No conjunctivitis or scleral icterus. ENT:  Oropharynx clear without lesion.  Tongue normal. Mucous membranes moist.  RESPIRATORY:  Clear to auscultation without rales, wheezes or rhonchi. CARDIOVASCULAR:  Regular rate and rhythm without murmur, rub or gallop. BREAST:  Right breast without masses, skin changes or nipple discharge.  Left breast without masses, skin changes or nipple discharge. ABDOMEN:  Soft, non-tender, with active bowel sounds, and no hepatosplenomegaly.  No masses. BACK:  No CVA tenderness.  No tenderness on percussion of the back or rib cage. SKIN:  No rashes, ulcers or lesions. EXTREMITIES: No edema, no skin discoloration or tenderness.  No palpable cords. LYMPH NODES: No palpable cervical, supraclavicular, axillary or inguinal adenopathy  NEUROLOGICAL: Unremarkable. PSYCH:  Appropriate.     Appointment on 12/27/2015  Component Date Value Ref Range Status  . WBC 12/27/2015 5.8  3.6 - 11.0 K/uL Final  . RBC 12/27/2015  3.32* 3.80 - 5.20 MIL/uL Final  . Hemoglobin 12/27/2015 10.5* 12.0 - 16.0 g/dL Final  . HCT 12/27/2015 30.4* 35.0 - 47.0 % Final  . MCV 12/27/2015 91.4  80.0 - 100.0 fL Final  . MCH 12/27/2015 31.5  26.0 - 34.0 pg Final  . MCHC 12/27/2015 34.4  32.0 - 36.0 g/dL Final  . RDW 12/27/2015 20.5* 11.5 - 14.5 % Final  . Platelets 12/27/2015 215  150 - 440 K/uL Final  . Neutrophils Relative % 12/27/2015 75   Final  . Neutro Abs 12/27/2015 4.4  1.4 - 6.5 K/uL Final  . Lymphocytes Relative 12/27/2015 12   Final  . Lymphs Abs 12/27/2015 0.7* 1.0 - 3.6 K/uL Final  . Monocytes Relative 12/27/2015 11   Final  . Monocytes Absolute 12/27/2015 0.6  0.2 - 0.9 K/uL Final  . Eosinophils Relative 12/27/2015 2   Final  . Eosinophils Absolute 12/27/2015 0.1  0 - 0.7 K/uL Final  . Basophils Relative 12/27/2015 0   Final  . Basophils Absolute 12/27/2015 0.0  0 - 0.1 K/uL Final  . Sodium 12/27/2015 131* 135 - 145 mmol/L Final  . Potassium 12/27/2015 2.4* 3.5 - 5.1 mmol/L Final   Comment: RESULTS VERIFIED BY REPEAT TESTING CRITICAL RESULT CALLED TO, READ BACK BY AND VERIFIED WITH HAYLEY RHODE AT 1011 12/26/2015 KMR   . Chloride 12/27/2015 98* 101 - 111 mmol/L Final  . CO2 12/27/2015 25  22 - 32 mmol/L Final  . Glucose, Bld 12/27/2015 124* 65 - 99 mg/dL Final  . BUN 12/27/2015 <5* 6 - 20 mg/dL Final  . Creatinine, Ser 12/27/2015 0.47  0.44 - 1.00 mg/dL Final  . Calcium 12/27/2015 8.7* 8.9 - 10.3 mg/dL Final  . Total Protein 12/27/2015 7.1  6.5 - 8.1 g/dL Final  . Albumin 12/27/2015 3.4* 3.5 - 5.0 g/dL Final  . AST 12/27/2015 17  15 - 41 U/L Final  . ALT 12/27/2015 15  14 - 54 U/L Final  . Alkaline Phosphatase 12/27/2015 64  38 - 126 U/L Final  . Total Bilirubin 12/27/2015 0.9  0.3 - 1.2 mg/dL Final  . GFR calc non Af Amer 12/27/2015 >60  >60 mL/min Final  . GFR calc Af Amer 12/27/2015 >60  >60 mL/min Final   Comment: (NOTE) The eGFR has been calculated using the CKD EPI equation. This calculation has not  been validated in all clinical situations. eGFR's persistently <60 mL/min signify possible Chronic Kidney  Disease.   . Anion gap 12/27/2015 8  5 - 15 Final  . Magnesium 12/27/2015 1.6* 1.7 - 2.4 mg/dL Final    No results found for: LABCA2 Lab Results  Component Value Date   CA199 <1 02/03/2014   Lab Results  Component Value Date   CEA 6.2* 11/12/2015   Component     Latest Ref Rng 01/25/2015 03/08/2015 03/21/2015 04/18/2015 05/23/2015  CEA     0.0 - 4.7 ng/mL 4.8 (H) 6.0 (H) 5.0 (H) 4.2 4.7   Component     Latest Ref Rng 06/13/2015  CEA     0.0 - 4.7 ng/mL 4.4         ASSESSMENT:  1.  Carcinoma of colon stage IV disease progressing disease based on CT scan.  MRI scan does not show any evidence of bone metastases. Neck pain has been under control.  Does not have any diarrhea nausea vomiting CBC looks within normal Lab data is pending Will reduce 5-FU continuous infusion to 5 days because of patient's issue taking a bath As well as patient started developing some loose stool which has improved now Patient's potassium is 2.4 Intravenous 40 mEq of potassium was given intravenously Magnesium is 1.8 Guidant  studies pending   Staging form: Colon and Rectum, AJCC 7th Edition     Clinical: Stage IVB (yT3, N1, M1b) - Signed by [name], MD on 03/07/2015   [name], MD   12/27/2015 11:51 AM  [facility]

## 2015-12-28 ENCOUNTER — Ambulatory Visit
Admission: RE | Admit: 2015-12-28 | Discharge: 2015-12-28 | Disposition: A | Discharge status: Home / Self Care | Payer: 59 | Source: Ambulatory Visit | Attending: Radiation Oncology | Admitting: Radiation Oncology

## 2015-12-28 DIAGNOSIS — C185 Malignant neoplasm of splenic flexure: Secondary | ICD-10-CM | POA: Diagnosis not present

## 2015-12-30 ENCOUNTER — Encounter: Payer: Self-pay | Admitting: Oncology

## 2015-12-31 ENCOUNTER — Encounter: Payer: Self-pay | Admitting: Student

## 2015-12-31 ENCOUNTER — Inpatient Hospital Stay: Payer: 59 | Admitting: Oncology

## 2015-12-31 ENCOUNTER — Other Ambulatory Visit: Payer: Self-pay | Admitting: *Deleted

## 2015-12-31 ENCOUNTER — Inpatient Hospital Stay
Admission: AD | Admit: 2015-12-31 | Discharge: 2016-01-18 | DRG: 326 | Disposition: A | Discharge status: Home / Self Care | Payer: 59 | Source: Ambulatory Visit | Attending: General Surgery | Admitting: General Surgery

## 2015-12-31 ENCOUNTER — Inpatient Hospital Stay: Payer: 59

## 2015-12-31 ENCOUNTER — Telehealth: Payer: Self-pay | Admitting: *Deleted

## 2015-12-31 ENCOUNTER — Ambulatory Visit: Payer: 59

## 2015-12-31 ENCOUNTER — Encounter: Payer: Self-pay | Admitting: Oncology

## 2015-12-31 VITALS — BP 139/97 | HR 112 | Temp 97.7°F | Resp 24 | Wt 154.4 lb

## 2015-12-31 VITALS — BP 121/78 | HR 112 | Temp 98.3°F | Resp 22

## 2015-12-31 DIAGNOSIS — C189 Malignant neoplasm of colon, unspecified: Secondary | ICD-10-CM

## 2015-12-31 DIAGNOSIS — C185 Malignant neoplasm of splenic flexure: Secondary | ICD-10-CM | POA: Diagnosis present

## 2015-12-31 DIAGNOSIS — Z9889 Other specified postprocedural states: Secondary | ICD-10-CM | POA: Diagnosis not present

## 2015-12-31 DIAGNOSIS — Z6826 Body mass index (BMI) 26.0-26.9, adult: Secondary | ICD-10-CM | POA: Diagnosis not present

## 2015-12-31 DIAGNOSIS — R630 Anorexia: Secondary | ICD-10-CM | POA: Diagnosis not present

## 2015-12-31 DIAGNOSIS — F419 Anxiety disorder, unspecified: Secondary | ICD-10-CM

## 2015-12-31 DIAGNOSIS — D72829 Elevated white blood cell count, unspecified: Secondary | ICD-10-CM | POA: Diagnosis not present

## 2015-12-31 DIAGNOSIS — C799 Secondary malignant neoplasm of unspecified site: Secondary | ICD-10-CM | POA: Diagnosis not present

## 2015-12-31 DIAGNOSIS — M545 Low back pain: Secondary | ICD-10-CM | POA: Diagnosis not present

## 2015-12-31 DIAGNOSIS — I959 Hypotension, unspecified: Secondary | ICD-10-CM | POA: Diagnosis not present

## 2015-12-31 DIAGNOSIS — R1013 Epigastric pain: Secondary | ICD-10-CM

## 2015-12-31 DIAGNOSIS — R5383 Other fatigue: Secondary | ICD-10-CM | POA: Diagnosis not present

## 2015-12-31 DIAGNOSIS — K56609 Unspecified intestinal obstruction, unspecified as to partial versus complete obstruction: Secondary | ICD-10-CM

## 2015-12-31 DIAGNOSIS — E860 Dehydration: Secondary | ICD-10-CM

## 2015-12-31 DIAGNOSIS — E876 Hypokalemia: Secondary | ICD-10-CM | POA: Diagnosis present

## 2015-12-31 DIAGNOSIS — Z923 Personal history of irradiation: Secondary | ICD-10-CM

## 2015-12-31 DIAGNOSIS — Z931 Gastrostomy status: Secondary | ICD-10-CM | POA: Diagnosis not present

## 2015-12-31 DIAGNOSIS — C787 Secondary malignant neoplasm of liver and intrahepatic bile duct: Secondary | ICD-10-CM | POA: Diagnosis not present

## 2015-12-31 DIAGNOSIS — Z9221 Personal history of antineoplastic chemotherapy: Secondary | ICD-10-CM

## 2015-12-31 DIAGNOSIS — Z0189 Encounter for other specified special examinations: Secondary | ICD-10-CM

## 2015-12-31 DIAGNOSIS — K5660 Unspecified intestinal obstruction: Secondary | ICD-10-CM | POA: Diagnosis present

## 2015-12-31 DIAGNOSIS — R110 Nausea: Secondary | ICD-10-CM | POA: Diagnosis not present

## 2015-12-31 DIAGNOSIS — R112 Nausea with vomiting, unspecified: Secondary | ICD-10-CM | POA: Diagnosis not present

## 2015-12-31 DIAGNOSIS — E43 Unspecified severe protein-calorie malnutrition: Secondary | ICD-10-CM | POA: Diagnosis present

## 2015-12-31 DIAGNOSIS — R531 Weakness: Secondary | ICD-10-CM

## 2015-12-31 DIAGNOSIS — M542 Cervicalgia: Secondary | ICD-10-CM | POA: Diagnosis not present

## 2015-12-31 DIAGNOSIS — K315 Obstruction of duodenum: Principal | ICD-10-CM | POA: Diagnosis present

## 2015-12-31 DIAGNOSIS — R1110 Vomiting, unspecified: Secondary | ICD-10-CM

## 2015-12-31 DIAGNOSIS — Z885 Allergy status to narcotic agent status: Secondary | ICD-10-CM

## 2015-12-31 DIAGNOSIS — Z888 Allergy status to other drugs, medicaments and biological substances status: Secondary | ICD-10-CM | POA: Diagnosis not present

## 2015-12-31 DIAGNOSIS — Z4659 Encounter for fitting and adjustment of other gastrointestinal appliance and device: Secondary | ICD-10-CM

## 2015-12-31 DIAGNOSIS — M549 Dorsalgia, unspecified: Secondary | ICD-10-CM | POA: Diagnosis present

## 2015-12-31 DIAGNOSIS — C786 Secondary malignant neoplasm of retroperitoneum and peritoneum: Secondary | ICD-10-CM | POA: Diagnosis present

## 2015-12-31 DIAGNOSIS — Z5111 Encounter for antineoplastic chemotherapy: Secondary | ICD-10-CM | POA: Diagnosis not present

## 2015-12-31 DIAGNOSIS — R197 Diarrhea, unspecified: Secondary | ICD-10-CM | POA: Diagnosis not present

## 2015-12-31 DIAGNOSIS — R000 Tachycardia, unspecified: Secondary | ICD-10-CM | POA: Diagnosis not present

## 2015-12-31 DIAGNOSIS — C7889 Secondary malignant neoplasm of other digestive organs: Secondary | ICD-10-CM | POA: Diagnosis not present

## 2015-12-31 DIAGNOSIS — Z79899 Other long term (current) drug therapy: Secondary | ICD-10-CM | POA: Diagnosis not present

## 2015-12-31 DIAGNOSIS — K8689 Other specified diseases of pancreas: Secondary | ICD-10-CM

## 2015-12-31 DIAGNOSIS — R634 Abnormal weight loss: Secondary | ICD-10-CM | POA: Diagnosis not present

## 2015-12-31 LAB — COMPREHENSIVE METABOLIC PANEL
ALBUMIN: 3.9 g/dL (ref 3.5–5.0)
ALK PHOS: 75 U/L (ref 38–126)
ALT: 18 U/L (ref 14–54)
ANION GAP: 12 (ref 5–15)
AST: 25 U/L (ref 15–41)
BUN: 6 mg/dL (ref 6–20)
CALCIUM: 9.5 mg/dL (ref 8.9–10.3)
CHLORIDE: 96 mmol/L — AB (ref 101–111)
CO2: 25 mmol/L (ref 22–32)
Creatinine, Ser: 0.72 mg/dL (ref 0.44–1.00)
GFR calc Af Amer: >60 mL/min (ref >60)
GFR calc non Af Amer: >60 mL/min (ref >60)
GLUCOSE: 142 mg/dL — AB (ref 65–99)
Potassium: 2.3 mmol/L — CL (ref 3.5–5.1)
SODIUM: 133 mmol/L — AB (ref 135–145)
Total Bilirubin: 1.2 mg/dL (ref 0.3–1.2)
Total Protein: 8 g/dL (ref 6.5–8.1)

## 2015-12-31 LAB — CBC WITH DIFFERENTIAL/PLATELET
BASOS PCT: 0 %
Basophils Absolute: 0 10*3/uL (ref 0–0.1)
EOS ABS: 0.1 10*3/uL (ref 0–0.7)
Eosinophils Relative: 1 %
HCT: 35.7 % (ref 35.0–47.0)
HEMOGLOBIN: 12.5 g/dL (ref 12.0–16.0)
Lymphocytes Relative: 11 %
Lymphs Abs: 0.7 10*3/uL — ABNORMAL LOW (ref 1.0–3.6)
MCH: 31.8 pg (ref 26.0–34.0)
MCHC: 35 g/dL (ref 32.0–36.0)
MCV: 91 fL (ref 80.0–100.0)
MONOS PCT: 9 %
Monocytes Absolute: 0.6 10*3/uL (ref 0.2–0.9)
NEUTROS PCT: 79 %
Neutro Abs: 5.3 10*3/uL (ref 1.4–6.5)
Platelets: 304 10*3/uL (ref 150–440)
RBC: 3.93 MIL/uL (ref 3.80–5.20)
RDW: 20 % — ABNORMAL HIGH (ref 11.5–14.5)
WBC: 6.7 10*3/uL (ref 3.6–11.0)

## 2015-12-31 LAB — POTASSIUM: Potassium: 3.1 mmol/L — ABNORMAL LOW (ref 3.5–5.1)

## 2015-12-31 LAB — MAGNESIUM: Magnesium: 1.9 mg/dL (ref 1.7–2.4)

## 2015-12-31 MED ORDER — HYDROMORPHONE HCL 1 MG/ML IJ SOLN
1.0000 mg | Freq: Four times a day (QID) | INTRAMUSCULAR | Status: DC | PRN
  Administered 2016-01-02 – 2016-01-06 (×12): 1 mg via INTRAVENOUS
  Filled 2015-12-31 (×12): qty 1

## 2015-12-31 MED ORDER — FAMOTIDINE IN NACL 20-0.9 MG/50ML-% IV SOLN
20.0000 mg | Freq: Once | INTRAVENOUS | Status: AC
  Administered 2015-12-31: 20 mg via INTRAVENOUS
  Filled 2015-12-31: qty 50

## 2015-12-31 MED ORDER — METOCLOPRAMIDE HCL 5 MG/ML IJ SOLN
10.0000 mg | Freq: Three times a day (TID) | INTRAMUSCULAR | Status: DC
  Administered 2015-12-31 – 2016-01-01 (×3): 10 mg via INTRAVENOUS
  Filled 2015-12-31 (×3): qty 2

## 2015-12-31 MED ORDER — LORAZEPAM 2 MG/ML IJ SOLN
0.5000 mg | Freq: Two times a day (BID) | INTRAMUSCULAR | Status: DC
  Administered 2015-12-31: 0.5 mg via INTRAVENOUS
  Filled 2015-12-31: qty 1

## 2015-12-31 MED ORDER — SODIUM CHLORIDE 0.9 % IV BOLUS (SEPSIS)
500.0000 mL | Freq: Once | INTRAVENOUS | Status: AC
  Administered 2015-12-31: 500 mL via INTRAVENOUS

## 2015-12-31 MED ORDER — POTASSIUM CHLORIDE IN NACL 20-0.9 MEQ/L-% IV SOLN
INTRAVENOUS | Status: DC
  Administered 2015-12-31 – 2016-01-07 (×12): via INTRAVENOUS
  Filled 2015-12-31 (×20): qty 1000

## 2015-12-31 MED ORDER — DEXTROSE 5 % IV SOLN
20.0000 mg | Freq: Once | INTRAVENOUS | Status: AC
  Administered 2015-12-31: 20 mg via INTRAVENOUS
  Filled 2015-12-31: qty 4

## 2015-12-31 MED ORDER — POTASSIUM CHLORIDE 2 MEQ/ML IV SOLN
Freq: Once | INTRAVENOUS | Status: DC
  Filled 2015-12-31: qty 1000

## 2015-12-31 MED ORDER — SODIUM CHLORIDE 0.9 % IV SOLN
8.0000 mg | Freq: Once | INTRAVENOUS | Status: AC
  Administered 2015-12-31: 8 mg via INTRAVENOUS
  Filled 2015-12-31: qty 0.8

## 2015-12-31 MED ORDER — SODIUM CHLORIDE 0.9 % IV SOLN
Freq: Once | INTRAVENOUS | Status: AC
  Administered 2015-12-31: 11:00:00 via INTRAVENOUS
  Filled 2015-12-31: qty 1000

## 2015-12-31 MED ORDER — LORAZEPAM 2 MG/ML IJ SOLN
1.0000 mg | Freq: Once | INTRAMUSCULAR | Status: AC
  Administered 2015-12-31: 1 mg via INTRAVENOUS
  Filled 2015-12-31: qty 1

## 2015-12-31 MED ORDER — SODIUM CHLORIDE 0.9 % IV SOLN: Freq: Once | INTRAVENOUS | Status: DC

## 2015-12-31 MED ORDER — SODIUM CHLORIDE 0.9 % IV SOLN
Freq: Four times a day (QID) | INTRAVENOUS | Status: DC
  Administered 2015-12-31 – 2016-01-02 (×8): via INTRAVENOUS
  Filled 2015-12-31 (×10): qty 2

## 2015-12-31 MED ORDER — ONDANSETRON HCL 40 MG/20ML IJ SOLN
INTRAMUSCULAR | Status: DC | PRN
  Administered 2016-01-01 – 2016-01-02 (×2): via INTRAVENOUS
  Filled 2015-12-31 (×5): qty 2

## 2015-12-31 NOTE — Telephone Encounter (Signed)
Patient being admitted to room 132.  Called report to [place], Therapist, sports.  Called orderly for transport from infusion suite to 1-C.  Called charge nurse in infusion to inform her orderly will be coming for patient in next 10 minutes.

## 2015-12-31 NOTE — Addendum Note (Signed)
Addended by: Telford Nab on: 12/31/2015 02:27 PM   Modules accepted: Orders

## 2015-12-31 NOTE — Progress Notes (Signed)
Pt experiencing sudden nausea this morning. Emesis x3. Also having menses for 3 weeks. First 2 weeks, very heavy flow; 3rd week light. Experiencing hand cramps in both upper arms and intermittent numbness in left arm.

## 2015-12-31 NOTE — Progress Notes (Signed)
Patient was admitted in hospital so please see initial history and physical and admission note

## 2015-12-31 NOTE — H&P (Signed)
[place] @ [facility] Telephone:(336) [phone]  Fax:(336) [place] OB: Nov 17, 1970  MR#: [id number]  ASN#:[id number]  Patient Care Team: Lynnell Jude, MD as PCP - General (Family Medicine) [name], MD (Oncology) Seeplaputhur [name], MD (General Surgery)  CHIEF COMPLAINT:  No chief complaint on file.  Oncology History   1. Carcinoma of the colon, splenic  flexure.   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 2. K-RAS mutated. 3. Patient started on FOLFOX and Avastin 4 th June, 2015 4. Allergic reaction  characterized by generalized itching and redness  of face and palm on June 15, 2014 5. Started on FOLFIRI  and avastin   July 13, 2014 6. Finished 12 cycles of chemotherapy, 5-FU based FOLFOX was changed to FOLFIRI BECAUSE OF SENSITIVITY TO OXALIPLATIN(September 07, 2014) 7. Started on maintenance chemotherapy with 5-FU leucovorin and Avastin from September 28, 2014 8.  Progressive disease based on tumor markers as well as PET scan's so patient was started on FOLFOX chemotherapy with desensitizing  protocol.   9.  Progressing disease by CT scan and by symptoms.  Patient will be started on TAS-102 from October, 2016 10.  Progressing disease on  TAS-102 (December, 2016) 11.patient has been started on palliative radiation therapy to retroperitoneal mass and 5-FU by continuous infusion (December 03, 2015 12.  Patient started in 5-FU by continuous infusion and radiation therapy (February, 2017)      INTERVAL HISTORY: \    45 year old lady with recurrent and progressing carcinoma of colon presently getting palliative chemotherapy with 5-FU and radiation therapy for increasing back pain came is an acute added on patient complaining of severe nausea and vomiting since yesterday.  Feeling extremely weak and tired.  Extremely apprehensive.  Also complains of cramps  in upper extremity According to her there is no pain.  Occasional diarrhea.Soreness in the mouth.  No chills or fever Patient was found to be hypokalemic and dehydrated patient underwent intravenous potassium. But for an further evaluation in infusion Center patient continues to cram feeling weak and tired somewhat apprehensive.  So was admitted in the hospital for correction of potassium controlling nausea evaluating by three-way abdomen to rule out any small bowel obstruction.  Patient has poor appetite.  Chemotherapy was discontinued radiation was put on hold.     REVIEW OF SYSTEMS:   GENERAL:  Patient was extremely apprehensive feeling very weak and tired.  Complaining of significant nausea vomiting PERFORMANCE STATUS (ECOG): 2 HEENT:  No visual changes, runny nose, sore throat, mouth sores or tenderness.  Mild headache Visual disturbances as described about Lungs: No shortness of breath or cough.  No hemoptysis. Cardiac:  No chest pain, palpitations, orthopnea, or PND. GI: Patient has significant nausea vomiting.  Collateral vomitus was yellowish.  No bleeding.  Occasional diarrhea.  No abdominal pain. GU:  No urgency, frequency, dysuria, or hematuria. Musculoskeletal:  Back pain as described above Extremities:  No pain or swelling.  Pain in the left hip after fall.took motrn Skin:  No rashes or skin changes. Neuro:  No headache, numbness or weakness, balance or coordination issues. Endocrine:  No diabetes, thyroid issues, hot flashes or night sweats. Psych:  No mood changes, depression or anxiety. Pain:  No focal pain. Review of systems:  All other systems reviewed and found to be negative.  As per HPI. Otherwise, a complete review of  systems is negatve.  PAST MEDICAL HISTORY: Past Medical History  Diagnosis Date  . Ulcer   . Anemia   . Obesity   . Constipation   . Migraines   . Cancer (HCC) 2015    colon  . Colon cancer metastasized to liver [facility]) April 2015    T3, N2,  M1, stage IV with biopsy-proven liver metastases. On adjuvant chemotherapy    PAST SURGICAL HISTORY: Past Surgical History  Procedure Laterality Date  . Cholecystectomy  2014  . Tubal ligation  2005  . Colonoscopy  2015  . Upper gi endoscopy  2015  . Esophagogastroduodenoscopy (egd) with propofol N/A 11/19/2015    Procedure: ESOPHAGOGASTRODUODENOSCOPY (EGD) WITH PROPOFOL;  Surgeon: Kieth Brightly, MD;  Location: [facility] ENDOSCOPY;  Service: Endoscopy;  Laterality: N/A;    FAMILY HISTORY No significant family history of malignancy       ADVANCED DIRECTIVES:   Patient does have advanced healthcare directive HEALTH MAINTENANCE: Social History  Substance Use Topics  . Smoking status: Never Smoker   . Smokeless tobacco: Not on file  . Alcohol Use: No      Allergies  Allergen Reactions  . Fentanyl Swelling    Swelling of lips  . Oxaliplatin Itching  . Hydrocodone-Acetaminophen Hives    Current Facility-Administered Medications  Medication Dose Route Frequency Provider Last Rate Last Dose  . 0.9 % NaCl with KCl 20 mEq/ L  infusion   Intravenous Continuous Johney Maine, MD 150 mL/hr at 12/31/15 1622    . HYDROmorphone (DILAUDID) injection 1 mg  1 mg Intravenous Q6H PRN Johney Maine, MD      . LORazepam (ATIVAN) injection 0.5 mg  0.5 mg Intravenous Q12H Johney Maine, MD      . metoCLOPramide (REGLAN) injection 10 mg  10 mg Intravenous 3 times per day Johney Maine, MD   10 mg at 12/31/15 1622  . ondansetron (ZOFRAN) 4 mg in sodium chloride 0.9 % 50 mL IVPB   Intravenous Q6H Ismael Karge, MD      . ondansetron (ZOFRAN) 4 mg in sodium chloride 0.9 % 50 mL IVPB   Intravenous Q4H PRN Johney Maine, MD       Facility-Administered Medications Ordered in Other Encounters  Medication Dose Route Frequency Provider Last Rate Last Dose  . sodium chloride 0.9 % injection 10 mL  10 mL Intracatheter PRN Johney Maine, MD   10 mL at 03/21/15 1025  . sodium chloride 0.9 % injection 10 mL   10 mL Intravenous PRN Johney Maine, MD   10 mL at 04/21/15 1025  . sodium chloride 0.9 % injection 10 mL  10 mL Intracatheter PRN Johney Maine, MD   10 mL at 12/03/15 1105    OBJECTIVE:  Filed Vitals:   12/31/15 1555  BP: 119/70  Pulse: 105  Temp: 98.4 F (36.9 C)  Resp: 18     There is no weight on file to calculate BMI.    ECOG FS:0 - Asymptomatic  PHYSICAL EXAM: GENERAL:  Well developed, well nourished, sitting comfortably in the exam room in no acute distress. MENTAL STATUS:  Alert and oriented to person, place and time. HEAD:  .  Normocephalic, atraumatic, face symmetric, no Cushingoid features. EYES:   Pupils equal round and reactive to light and accomodation.  No conjunctivitis or scleral icterus. ENT:  Oropharynx clear without lesion.  Tongue normal. Mucous membranes moist.  RESPIRATORY:  Clear to auscultation without rales, wheezes or rhonchi. CARDIOVASCULAR:  Regular rate and rhythm without  murmur, rub or gallop. BREAST:  Right breast without masses, skin changes or nipple discharge.  Left breast without masses, skin changes or nipple discharge. ABDOMEN:Abdomen was soft.  Tenderness in epigastric area.  Bowel sounds are increased.  No guarding or rigidity. BACK:  No CVA tenderness.  No tenderness on percussion of the back or rib cage. SKIN:  No rashes, ulcers or lesions. EXTREMITIES: No edema, no skin discoloration or tenderness.  No palpable cords. LYMPH NODES: No palpable cervical, supraclavicular, axillary or inguinal adenopathy  NEUROLOGICAL: Unremarkable. PSYCH:  Appropriate.     Admission on 12/31/2015  Component Date Value Ref Range Status  . Potassium 12/31/2015 3.1* 3.5 - 5.1 mmol/L Final  Appointment on 12/31/2015  Component Date Value Ref Range Status  . WBC 12/31/2015 6.7  3.6 - 11.0 K/uL Final  . RBC 12/31/2015 3.93  3.80 - 5.20 MIL/uL Final  . Hemoglobin 12/31/2015 12.5  12.0 - 16.0 g/dL Final  . HCT 12/31/2015 35.7  35.0 - 47.0 % Final  . MCV  12/31/2015 91.0  80.0 - 100.0 fL Final  . MCH 12/31/2015 31.8  26.0 - 34.0 pg Final  . MCHC 12/31/2015 35.0  32.0 - 36.0 g/dL Final  . RDW 12/31/2015 20.0* 11.5 - 14.5 % Final  . Platelets 12/31/2015 304  150 - 440 K/uL Final  . Neutrophils Relative % 12/31/2015 79   Final  . Neutro Abs 12/31/2015 5.3  1.4 - 6.5 K/uL Final  . Lymphocytes Relative 12/31/2015 11   Final  . Lymphs Abs 12/31/2015 0.7* 1.0 - 3.6 K/uL Final  . Monocytes Relative 12/31/2015 9   Final  . Monocytes Absolute 12/31/2015 0.6  0.2 - 0.9 K/uL Final  . Eosinophils Relative 12/31/2015 1   Final  . Eosinophils Absolute 12/31/2015 0.1  0 - 0.7 K/uL Final  . Basophils Relative 12/31/2015 0   Final  . Basophils Absolute 12/31/2015 0.0  0 - 0.1 K/uL Final  . Sodium 12/31/2015 133* 135 - 145 mmol/L Final  . Potassium 12/31/2015 2.3* 3.5 - 5.1 mmol/L Final   Comment: RESULTS VERIFIED BY REPEAT TESTING CRITICAL RESULT CALLED TO, READ BACK BY AND VERIFIED WITH HAYLEY RHODE AT 1013 12/31/2015 KMR   . Chloride 12/31/2015 96* 101 - 111 mmol/L Final  . CO2 12/31/2015 25  22 - 32 mmol/L Final  . Glucose, Bld 12/31/2015 142* 65 - 99 mg/dL Final  . BUN 12/31/2015 6  6 - 20 mg/dL Final  . Creatinine, Ser 12/31/2015 0.72  0.44 - 1.00 mg/dL Final  . Calcium 12/31/2015 9.5  8.9 - 10.3 mg/dL Final  . Total Protein 12/31/2015 8.0  6.5 - 8.1 g/dL Final  . Albumin 12/31/2015 3.9  3.5 - 5.0 g/dL Final  . AST 12/31/2015 25  15 - 41 U/L Final  . ALT 12/31/2015 18  14 - 54 U/L Final  . Alkaline Phosphatase 12/31/2015 75  38 - 126 U/L Final  . Total Bilirubin 12/31/2015 1.2  0.3 - 1.2 mg/dL Final  . GFR calc non Af Amer 12/31/2015 >60  >60 mL/min Final  . GFR calc Af Amer 12/31/2015 >60  >60 mL/min Final   Comment: (NOTE) The eGFR has been calculated using the CKD EPI equation. This calculation has not been validated in all clinical situations. eGFR's persistently <60 mL/min signify possible Chronic Kidney Disease.   . Anion gap 12/31/2015  12  5 - 15 Final  . Magnesium 12/31/2015 1.9  1.7 - 2.4 mg/dL Final    No results  found for: LABCA2 Lab Results  Component Value Date   CA199 <1 02/03/2014   Lab Results  Component Value Date   CEA 6.2* 11/12/2015   Component     Latest Ref Rng 01/25/2015 03/08/2015 03/21/2015 04/18/2015 05/23/2015  CEA     0.0 - 4.7 ng/mL 4.8 (H) 6.0 (H) 5.0 (H) 4.2 4.7   Component     Latest Ref Rng 06/13/2015  CEA     0.0 - 4.7 ng/mL 4.4         ASSESSMENT:  1.  Carcinoma of colon stage IV disease progressing disease based on CT scan.  2 PATIENT   was admitted in hospital with acute nausea and vomiting.  Outpatient treatment with intravenous fluid did not control nausea and vomiting.  The patient was admitted in the hospital. Hypokalemia We will start intravenous potassium.  Recheck potassium after 40 mEq of intravenous potassium Level was 3.1.  We will continue IV fluid Continue IV Reglan IV antinausea medication 3-way abdomen to see whether there is any evidence of small bowel obstruction. Hold chemoradiation therapy at present time the patient's condition improves tomorrow discharge patient for continuation of radiation and chemotherapy Overall prognosis is very poor.  recheck Tomorrow  Rectum, AJCC 7th Edition     Clinical: Stage IVB (yT3, N1, M1b) - Signed by [name], MD on 03/07/2015   [name], MD   12/31/2015 4:38 PM  [place] @ [facility]

## 2015-12-31 NOTE — Progress Notes (Signed)
Noted that pt's bp has been low and most recent reading is 98/51.  Called Dr Gladstone Lighter and received order for 500 cc bolus. Dorna Bloom RN

## 2016-01-01 ENCOUNTER — Inpatient Hospital Stay: Payer: 59

## 2016-01-01 ENCOUNTER — Inpatient Hospital Stay: Payer: 59 | Admitting: Oncology

## 2016-01-01 ENCOUNTER — Ambulatory Visit: Payer: 59

## 2016-01-01 DIAGNOSIS — R634 Abnormal weight loss: Secondary | ICD-10-CM

## 2016-01-01 LAB — CBC WITH DIFFERENTIAL/PLATELET
BASOS ABS: 0 10*3/uL (ref 0–0.1)
Basophils Relative: 0 %
EOS ABS: 0 10*3/uL (ref 0–0.7)
HCT: 24.9 % — ABNORMAL LOW (ref 35.0–47.0)
HEMOGLOBIN: 8.7 g/dL — AB (ref 12.0–16.0)
LYMPHS ABS: 0.5 10*3/uL — AB (ref 1.0–3.6)
MCH: 31.9 pg (ref 26.0–34.0)
MCHC: 34.9 g/dL (ref 32.0–36.0)
MCV: 91.6 fL (ref 80.0–100.0)
Monocytes Absolute: 0.6 10*3/uL (ref 0.2–0.9)
Monocytes Relative: 10 %
Neutro Abs: 5.1 10*3/uL (ref 1.4–6.5)
PLATELETS: 232 10*3/uL (ref 150–440)
RBC: 2.71 MIL/uL — AB (ref 3.80–5.20)
RDW: 19.9 % — ABNORMAL HIGH (ref 11.5–14.5)
WBC: 6.3 10*3/uL (ref 3.6–11.0)

## 2016-01-01 LAB — COMPREHENSIVE METABOLIC PANEL
ALBUMIN: 2.9 g/dL — AB (ref 3.5–5.0)
ALT: 13 U/L — AB (ref 14–54)
AST: 20 U/L (ref 15–41)
Alkaline Phosphatase: 48 U/L (ref 38–126)
Anion gap: 5 (ref 5–15)
BUN: 5 mg/dL — AB (ref 6–20)
CHLORIDE: 110 mmol/L (ref 101–111)
CO2: 22 mmol/L (ref 22–32)
CREATININE: 0.54 mg/dL (ref 0.44–1.00)
Calcium: 8.3 mg/dL — ABNORMAL LOW (ref 8.9–10.3)
GFR calc Af Amer: >60 mL/min (ref >60)
GFR calc non Af Amer: >60 mL/min (ref >60)
Glucose, Bld: 117 mg/dL — ABNORMAL HIGH (ref 65–99)
Potassium: 3.3 mmol/L — ABNORMAL LOW (ref 3.5–5.1)
SODIUM: 137 mmol/L (ref 135–145)
Total Bilirubin: 0.7 mg/dL (ref 0.3–1.2)
Total Protein: 5.7 g/dL — ABNORMAL LOW (ref 6.5–8.1)

## 2016-01-01 MED ORDER — METOCLOPRAMIDE HCL 10 MG PO TABS
10.0000 mg | ORAL_TABLET | Freq: Three times a day (TID) | ORAL | Status: DC
  Administered 2016-01-01 – 2016-01-02 (×3): 10 mg via ORAL
  Filled 2016-01-01 (×5): qty 1

## 2016-01-01 MED ORDER — LORAZEPAM 0.5 MG PO TABS
0.5000 mg | ORAL_TABLET | Freq: Two times a day (BID) | ORAL | Status: DC
  Administered 2016-01-01 (×2): 0.5 mg via ORAL
  Filled 2016-01-01 (×2): qty 1

## 2016-01-01 NOTE — Progress Notes (Signed)
Dr. Jeb Levering paged several times to make him aware of pt's hemoglobin of 8.7.  No call back at this time.  Clarise Cruz, RN

## 2016-01-01 NOTE — Progress Notes (Signed)
Patient was admitted in hospital so please see initial history and physical and admission note  [place] @ [facility] Telephone:(336) [phone]  Fax:(336) [place] OB: 1971-02-21  MR#: [id number]  ZWC#:[id number]  Patient Care Team: Lynnell Jude, MD as PCP - General (Family Medicine) [name], MD (Oncology) Seeplaputhur [name], MD (General Surgery)  CHIEF COMPLAINT:  No chief complaint on file.  Oncology History   1. Carcinoma of the colon, splenic  flexure.   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 2. K-RAS mutated. 3. Patient started on FOLFOX and Avastin 4 th June, 2015 4. Allergic reaction  characterized by generalized itching and redness  of face and palm on June 15, 2014 5. Started on FOLFIRI  and avastin   July 13, 2014 6. Finished 12 cycles of chemotherapy, 5-FU based FOLFOX was changed to FOLFIRI BECAUSE OF SENSITIVITY TO OXALIPLATIN(September 07, 2014) 7. Started on maintenance chemotherapy with 5-FU leucovorin and Avastin from September 28, 2014 8.  Progressive disease based on tumor markers as well as PET scan's so patient was started on FOLFOX chemotherapy with desensitizing  protocol.   9.  Progressing disease by CT scan and by symptoms.  Patient will be started on TAS-102 from October, 2016 10.  Progressing disease on  TAS-102 (December, 2016) 11.patient has been started on palliative radiation therapy to retroperitoneal mass and 5-FU by continuous infusion (December 03, 2015 12.  Patient started in 5-FU by continuous infusion and radiation therapy (February, 2017)      INTERVAL HISTORY: \  45 year old lady with progressing colon cancer.  Losing weight.  Appetite is poor. Dilaudid is helping with the pain without any significant side effect.  SHE WAS was admitted in the hospital with intractable nausea and vomiting.  This morning patient  is feeling somewhat better has very mild nausea.  Appetite is still poor.       REVIEW OF SYSTEMS:   GENERAL:  Feels good.  Active.  No fevers, sweats or weight loss. PERFORMANCE STATUS (ECOG): 0 HEENT:  No visual changes, runny nose, sore throat, mouth sores or tenderness.  Mild headache Visual disturbances as described about Lungs: No shortness of breath or cough.  No hemoptysis. Cardiac:  No chest pain, palpitations, orthopnea, or PND. GI: Nausea following chemotherapy lasting for more than 24 hours which is now resolved.  No diarrhea. GU:  No urgency, frequency, dysuria, or hematuria. Musculoskeletal:  Back pain as described above Extremities:  No pain or swelling.  Pain in the left hip after fall.took motrn Skin:  No rashes or skin changes. Neuro:  No headache, numbness or weakness, balance or coordination issues. Endocrine:  No diabetes, thyroid issues, hot flashes or night sweats. Psych:  No mood changes, depression or anxiety. Pain:  No focal pain. Review of systems:  All other systems reviewed and found to be negative.  As per HPI. Otherwise, a complete review of systems is negatve.  PAST MEDICAL HISTORY: Past Medical History  Diagnosis Date  . Ulcer   . Anemia   . Obesity   . Constipation   . Migraines   . Cancer ([place]) 2015    colon  . Colon cancer metastasized to liver [facility]) April 2015    T3, N2, M1, stage IV with biopsy-proven liver metastases. On adjuvant chemotherapy    PAST SURGICAL HISTORY: Past Surgical History  Procedure  Laterality Date  . Cholecystectomy  2014  . Tubal ligation  2005  . Colonoscopy  2015  . Upper gi endoscopy  2015  . Esophagogastroduodenoscopy (egd) with propofol N/A 11/19/2015    Procedure: ESOPHAGOGASTRODUODENOSCOPY (EGD) WITH PROPOFOL;  Surgeon: Christene Lye, MD;  Location: [facility] ENDOSCOPY;  Service: Endoscopy;  Laterality: N/A;    FAMILY HISTORY No significant family history of malignancy       ADVANCED  DIRECTIVES:   Patient does have advanced healthcare directive HEALTH MAINTENANCE: Social History  Substance Use Topics  . Smoking status: Never Smoker   . Smokeless tobacco: None  . Alcohol Use: No      Allergies  Allergen Reactions  . Fentanyl Swelling    Swelling of lips  . Oxaliplatin Itching  . Hydrocodone-Acetaminophen Hives    Current Facility-Administered Medications  Medication Dose Route Frequency Provider Last Rate Last Dose  . 0.9 % NaCl with KCl 20 mEq/ L  infusion   Intravenous Continuous [name], MD 150 mL/hr at 01/01/16 0100    . HYDROmorphone (DILAUDID) injection 1 mg  1 mg Intravenous Q6H PRN [name], MD      . LORazepam (ATIVAN) tablet 0.5 mg  0.5 mg Oral BID [name], MD      . metoCLOPramide (REGLAN) tablet 10 mg  10 mg Oral TID AC & HS [name], MD      . ondansetron (ZOFRAN) 4 mg in sodium chloride 0.9 % 50 mL IVPB   Intravenous Q6H Renell Coaxum, MD      . ondansetron (ZOFRAN) 4 mg in sodium chloride 0.9 % 50 mL IVPB   Intravenous Q4H PRN [name], MD       Facility-Administered Medications Ordered in Other Encounters  Medication Dose Route Frequency Provider Last Rate Last Dose  . sodium chloride 0.9 % injection 10 mL  10 mL Intracatheter PRN [name], MD   10 mL at 03/21/15 1025  . sodium chloride 0.9 % injection 10 mL  10 mL Intravenous PRN [name], MD   10 mL at 04/21/15 1025  . sodium chloride 0.9 % injection 10 mL  10 mL Intracatheter PRN [name], MD   10 mL at 12/03/15 1105    OBJECTIVE:  Filed Vitals:   01/01/16 0444 01/01/16 0455  BP: 104/36 111/59  Pulse: 113 99  Temp: 99.3 F (37.4 C)   Resp: 18      Body mass index is 26.49 kg/(m^2).    ECOG FS:0 - Asymptomatic  PHYSICAL EXAM: GENERAL:  Well developed, well nourished, sitting comfortably in the exam room in no acute distress. MENTAL STATUS:  Alert and oriented to person, place and time. HEAD:  .  Normocephalic, atraumatic, face symmetric, no  Cushingoid features. EYES:   Pupils equal round and reactive to light and accomodation.  No conjunctivitis or scleral icterus. ENT:  Oropharynx clear without lesion.  Tongue normal. Mucous membranes moist.  RESPIRATORY:  Clear to auscultation without rales, wheezes or rhonchi. CARDIOVASCULAR:  Regular rate and rhythm without murmur, rub or gallop. BREAST:  Right breast without masses, skin changes or nipple discharge.  Left breast without masses, skin changes or nipple discharge. ABDOMEN:  Soft, non-tender, with active bowel sounds, and no hepatosplenomegaly.  No masses. BACK:  No CVA tenderness.  No tenderness on percussion of the back or rib cage. SKIN:  No rashes, ulcers or lesions. EXTREMITIES: No edema, no skin discoloration or tenderness.  No palpable cords. LYMPH NODES: No palpable cervical, supraclavicular,  axillary or inguinal adenopathy  NEUROLOGICAL: Unremarkable. PSYCH:  Appropriate.     Admission on 12/31/2015  Component Date Value Ref Range Status  . Potassium 12/31/2015 3.1* 3.5 - 5.1 mmol/L Final  . Sodium 01/01/2016 137  135 - 145 mmol/L Final  . Potassium 01/01/2016 3.3* 3.5 - 5.1 mmol/L Final  . Chloride 01/01/2016 110  101 - 111 mmol/L Final  . CO2 01/01/2016 22  22 - 32 mmol/L Final  . Glucose, Bld 01/01/2016 117* 65 - 99 mg/dL Final  . BUN 01/01/2016 5* 6 - 20 mg/dL Final  . Creatinine, Ser 01/01/2016 0.54  0.44 - 1.00 mg/dL Final  . Calcium 01/01/2016 8.3* 8.9 - 10.3 mg/dL Final  . Total Protein 01/01/2016 5.7* 6.5 - 8.1 g/dL Final  . Albumin 01/01/2016 2.9* 3.5 - 5.0 g/dL Final  . AST 01/01/2016 20  15 - 41 U/L Final  . ALT 01/01/2016 13* 14 - 54 U/L Final  . Alkaline Phosphatase 01/01/2016 48  38 - 126 U/L Final  . Total Bilirubin 01/01/2016 0.7  0.3 - 1.2 mg/dL Final  . GFR calc non Af Amer 01/01/2016 >60  >60 mL/min Final  . GFR calc Af Amer 01/01/2016 >60  >60 mL/min Final   Comment: (NOTE) The eGFR has been calculated using the CKD EPI equation. This  calculation has not been validated in all clinical situations. eGFR's persistently <60 mL/min signify possible Chronic Kidney Disease.   . Anion gap 01/01/2016 5  5 - 15 Final  . WBC 01/01/2016 6.3  3.6 - 11.0 K/uL Final  . RBC 01/01/2016 2.71* 3.80 - 5.20 MIL/uL Final  . Hemoglobin 01/01/2016 8.7* 12.0 - 16.0 g/dL Final   RESULT REPEATED AND VERIFIED  . HCT 01/01/2016 24.9* 35.0 - 47.0 % Final  . MCV 01/01/2016 91.6  80.0 - 100.0 fL Final  . MCH 01/01/2016 31.9  26.0 - 34.0 pg Final  . MCHC 01/01/2016 34.9  32.0 - 36.0 g/dL Final  . RDW 01/01/2016 19.9* 11.5 - 14.5 % Final  . Platelets 01/01/2016 232  150 - 440 K/uL Final  . Neutrophils Relative % 01/01/2016 81%   Final  . Neutro Abs 01/01/2016 5.1  1.4 - 6.5 K/uL Final  . Lymphocytes Relative 01/01/2016 9%   Final  . Lymphs Abs 01/01/2016 0.5* 1.0 - 3.6 K/uL Final  . Monocytes Relative 01/01/2016 10%   Final  . Monocytes Absolute 01/01/2016 0.6  0.2 - 0.9 K/uL Final  . Eosinophils Relative 01/01/2016 0%   Final  . Eosinophils Absolute 01/01/2016 0.0  0 - 0.7 K/uL Final  . Basophils Relative 01/01/2016 0%   Final  . Basophils Absolute 01/01/2016 0.0  0 - 0.1 K/uL Final  Appointment on 12/31/2015  Component Date Value Ref Range Status  . WBC 12/31/2015 6.7  3.6 - 11.0 K/uL Final  . RBC 12/31/2015 3.93  3.80 - 5.20 MIL/uL Final  . Hemoglobin 12/31/2015 12.5  12.0 - 16.0 g/dL Final  . HCT 12/31/2015 35.7  35.0 - 47.0 % Final  . MCV 12/31/2015 91.0  80.0 - 100.0 fL Final  . MCH 12/31/2015 31.8  26.0 - 34.0 pg Final  . MCHC 12/31/2015 35.0  32.0 - 36.0 g/dL Final  . RDW 12/31/2015 20.0* 11.5 - 14.5 % Final  . Platelets 12/31/2015 304  150 - 440 K/uL Final  . Neutrophils Relative % 12/31/2015 79   Final  . Neutro Abs 12/31/2015 5.3  1.4 - 6.5 K/uL Final  . Lymphocytes Relative 12/31/2015 11  Final  . Lymphs Abs 12/31/2015 0.7* 1.0 - 3.6 K/uL Final  . Monocytes Relative 12/31/2015 9   Final  . Monocytes Absolute 12/31/2015 0.6   0.2 - 0.9 K/uL Final  . Eosinophils Relative 12/31/2015 1   Final  . Eosinophils Absolute 12/31/2015 0.1  0 - 0.7 K/uL Final  . Basophils Relative 12/31/2015 0   Final  . Basophils Absolute 12/31/2015 0.0  0 - 0.1 K/uL Final  . Sodium 12/31/2015 133* 135 - 145 mmol/L Final  . Potassium 12/31/2015 2.3* 3.5 - 5.1 mmol/L Final   Comment: RESULTS VERIFIED BY REPEAT TESTING CRITICAL RESULT CALLED TO, READ BACK BY AND VERIFIED WITH HAYLEY RHODE AT 1013 12/31/2015 KMR   . Chloride 12/31/2015 96* 101 - 111 mmol/L Final  . CO2 12/31/2015 25  22 - 32 mmol/L Final  . Glucose, Bld 12/31/2015 142* 65 - 99 mg/dL Final  . BUN 12/31/2015 6  6 - 20 mg/dL Final  . Creatinine, Ser 12/31/2015 0.72  0.44 - 1.00 mg/dL Final  . Calcium 12/31/2015 9.5  8.9 - 10.3 mg/dL Final  . Total Protein 12/31/2015 8.0  6.5 - 8.1 g/dL Final  . Albumin 12/31/2015 3.9  3.5 - 5.0 g/dL Final  . AST 12/31/2015 25  15 - 41 U/L Final  . ALT 12/31/2015 18  14 - 54 U/L Final  . Alkaline Phosphatase 12/31/2015 75  38 - 126 U/L Final  . Total Bilirubin 12/31/2015 1.2  0.3 - 1.2 mg/dL Final  . GFR calc non Af Amer 12/31/2015 >60  >60 mL/min Final  . GFR calc Af Amer 12/31/2015 >60  >60 mL/min Final   Comment: (NOTE) The eGFR has been calculated using the CKD EPI equation. This calculation has not been validated in all clinical situations. eGFR's persistently <60 mL/min signify possible Chronic Kidney Disease.   . Anion gap 12/31/2015 12  5 - 15 Final  . Magnesium 12/31/2015 1.9  1.7 - 2.4 mg/dL Final    No results found for: LABCA2 Lab Results  Component Value Date   CA199 <1 02/03/2014   Lab Results  Component Value Date   CEA 6.2* 11/12/2015   Component     Latest Ref Rng 01/25/2015 03/08/2015 03/21/2015 04/18/2015 05/23/2015  CEA     0.0 - 4.7 ng/mL 4.8 (H) 6.0 (H) 5.0 (H) 4.2 4.7   Component     Latest Ref Rng 06/13/2015  CEA     0.0 - 4.7 ng/mL 4.4         ASSESSMENT:  1.  Carcinoma of colon stage IV  disease progressing disease based on CT scan.    Intractable nausea and vomiting 3 WAY   abdomen shows no evidence of obstruction. Potassium level is improved to 3.3 with improvement in the cramps.  Proceed with oral antinausea medication.  Advance diet. Decrease IV fluid Patient   HAD episode of hypotension which improved after bolus of fluid was given AMBULATE patient Nausea and vomiting is under control patient can be discharged tomorrow and will resume treatment from next week with radiation and chemotherapy      Staging form: Colon and Rectum, AJCC 7th Edition     Clinical: Stage IVB (yT3, N1, M1b) - Signed by [name], MD on 03/07/2015   [name], MD   01/01/2016 8:44 AM  [place] @ [facility]

## 2016-01-02 ENCOUNTER — Ambulatory Visit: Payer: 59

## 2016-01-02 ENCOUNTER — Inpatient Hospital Stay: Payer: 59

## 2016-01-02 ENCOUNTER — Encounter: Payer: Self-pay | Admitting: Radiology

## 2016-01-02 DIAGNOSIS — E43 Unspecified severe protein-calorie malnutrition: Secondary | ICD-10-CM | POA: Insufficient documentation

## 2016-01-02 LAB — CBC WITH DIFFERENTIAL/PLATELET
BASOS ABS: 0 10*3/uL (ref 0–0.1)
BASOS PCT: 0 %
EOS PCT: 0 %
Eosinophils Absolute: 0 10*3/uL (ref 0–0.7)
HEMATOCRIT: 27.1 % — AB (ref 35.0–47.0)
Hemoglobin: 9.4 g/dL — ABNORMAL LOW (ref 12.0–16.0)
LYMPHS PCT: 11 %
Lymphs Abs: 0.7 10*3/uL — ABNORMAL LOW (ref 1.0–3.6)
MCH: 31.8 pg (ref 26.0–34.0)
MCHC: 34.7 g/dL (ref 32.0–36.0)
MCV: 91.7 fL (ref 80.0–100.0)
Monocytes Absolute: 0.8 10*3/uL (ref 0.2–0.9)
Monocytes Relative: 12 %
NEUTROS PCT: 77 %
Neutro Abs: 5.1 10*3/uL (ref 1.4–6.5)
PLATELETS: 250 10*3/uL (ref 150–440)
RBC: 2.96 MIL/uL — AB (ref 3.80–5.20)
RDW: 19.9 % — ABNORMAL HIGH (ref 11.5–14.5)
WBC: 6.7 10*3/uL (ref 3.6–11.0)

## 2016-01-02 LAB — COMPREHENSIVE METABOLIC PANEL
ALBUMIN: 3 g/dL — AB (ref 3.5–5.0)
ALT: 14 U/L (ref 14–54)
AST: 27 U/L (ref 15–41)
Alkaline Phosphatase: 54 U/L (ref 38–126)
Anion gap: 4 — ABNORMAL LOW (ref 5–15)
CHLORIDE: 112 mmol/L — AB (ref 101–111)
CO2: 25 mmol/L (ref 22–32)
CREATININE: 0.65 mg/dL (ref 0.44–1.00)
Calcium: 8.8 mg/dL — ABNORMAL LOW (ref 8.9–10.3)
GFR calc Af Amer: >60 mL/min (ref >60)
GLUCOSE: 103 mg/dL — AB (ref 65–99)
Potassium: 3.3 mmol/L — ABNORMAL LOW (ref 3.5–5.1)
Sodium: 141 mmol/L (ref 135–145)
Total Bilirubin: 0.8 mg/dL (ref 0.3–1.2)
Total Protein: 6 g/dL — ABNORMAL LOW (ref 6.5–8.1)

## 2016-01-02 MED ORDER — BOOST / RESOURCE BREEZE PO LIQD
1.0000 | Freq: Two times a day (BID) | ORAL | Status: DC
  Administered 2016-01-02 (×2): 1 via ORAL

## 2016-01-02 MED ORDER — IOHEXOL 240 MG/ML SOLN
50.0000 mL | INTRAMUSCULAR | Status: AC
  Administered 2016-01-02: 50 mL via ORAL

## 2016-01-02 MED ORDER — IOHEXOL 300 MG/ML  SOLN
100.0000 mL | Freq: Once | INTRAMUSCULAR | Status: AC | PRN
  Administered 2016-01-02: 18:00:00 100 mL via INTRAVENOUS

## 2016-01-02 MED ORDER — ONDANSETRON HCL 4 MG/2ML IJ SOLN
4.0000 mg | INTRAMUSCULAR | Status: DC | PRN
  Administered 2016-01-04 – 2016-01-15 (×4): 4 mg via INTRAVENOUS
  Filled 2016-01-02 (×6): qty 2

## 2016-01-02 MED ORDER — DEXAMETHASONE SODIUM PHOSPHATE 10 MG/ML IJ SOLN
10.0000 mg | Freq: Once | INTRAMUSCULAR | Status: AC
  Administered 2016-01-02: 10 mg via INTRAVENOUS
  Filled 2016-01-02: qty 1

## 2016-01-02 MED ORDER — ONDANSETRON HCL 4 MG/2ML IJ SOLN
4.0000 mg | Freq: Four times a day (QID) | INTRAMUSCULAR | Status: DC
  Administered 2016-01-02 – 2016-01-08 (×23): 4 mg via INTRAVENOUS
  Filled 2016-01-02 (×21): qty 2

## 2016-01-02 MED ORDER — METOCLOPRAMIDE HCL 5 MG/ML IJ SOLN
10.0000 mg | Freq: Four times a day (QID) | INTRAMUSCULAR | Status: DC
  Administered 2016-01-02 – 2016-01-08 (×25): 10 mg via INTRAVENOUS
  Filled 2016-01-02 (×25): qty 2

## 2016-01-02 MED ORDER — FAMOTIDINE IN NACL 20-0.9 MG/50ML-% IV SOLN
20.0000 mg | INTRAVENOUS | Status: DC
  Administered 2016-01-02 – 2016-01-15 (×14): 20 mg via INTRAVENOUS
  Filled 2016-01-02 (×16): qty 50

## 2016-01-02 MED ORDER — ACETAMINOPHEN 325 MG PO TABS
650.0000 mg | ORAL_TABLET | Freq: Three times a day (TID) | ORAL | Status: DC | PRN
  Administered 2016-01-02: 650 mg via ORAL
  Filled 2016-01-02: qty 2

## 2016-01-02 MED ORDER — LORAZEPAM 2 MG/ML IJ SOLN
0.5000 mg | Freq: Every morning | INTRAMUSCULAR | Status: DC
  Administered 2016-01-02 – 2016-01-18 (×17): 0.5 mg via INTRAVENOUS
  Filled 2016-01-02 (×18): qty 1

## 2016-01-02 NOTE — Progress Notes (Signed)
Initial Nutrition Assessment  DOCUMENTATION CODES:   Severe malnutrition in context of chronic illness  INTERVENTION:   Meals and Snacks: Cater to patient preferences; noted plastic utensils Medical Food Supplement Therapy: will recommend Boost Breeze po BID, each supplement provides 250 kcal and 9 grams of protein for pt to try, will send [employer] on meal trays as pt reports tolerating these well PTA   NUTRITION DIAGNOSIS:   Malnutrition related to cancer and cancer related treatments, chronic illness as evidenced by energy intake < 75% for > or equal to 1 month, percent weight loss.  GOAL:   Patient will meet greater than or equal to 90% of their needs  MONITOR:   PO intake, Supplement acceptance, Labs, Weight trends, I & O's  REASON FOR ASSESSMENT:   Malnutrition Screening Tool    ASSESSMENT:   Pt admitted with intractible n/v with hypokalemia. Pt with h/o colon cancer on palliative chemoradiation.  Past Medical History  Diagnosis Date  . Ulcer   . Anemia   . Obesity   . Constipation   . Migraines   . Cancer ([place]) 2015    colon  . Colon cancer metastasized to liver [facility]) April 2015    T3, N2, M1, stage IV with biopsy-proven liver metastases. On adjuvant chemotherapy    Diet Order:  Diet regular Room service appropriate?: Yes; Fluid consistency:: Thin    Current Nutrition: Pt ate 50% of fruit cup this am and bites of yogurt. Pt reports appetite is fair at best. Pt reports eating a little bit of mashed potatoes and peaches last night for dinner. Pt reports having applesauce with medications early this am but having emesis right after not keeping it down.  Food/Nutrition-Related History: Pt reports poor po intake for the past 2 months eating very little at meals/snacks. Pt reports 'I just don't have an appetite.' Pt reports more recently not tolerating foods with n/v.    Scheduled Medications:  . dexamethasone  10 mg Intravenous Once  .  famotidine (PEPCID) IV  20 mg Intravenous Q24H  . feeding supplement  1 Container Oral BID BM  . LORazepam  0.5 mg Intravenous q morning - 10a  . metoCLOPramide (REGLAN) injection  10 mg Intravenous 4 times per day  . ondansetron (ZOFRAN) IVPB [facility] +/- dexamethasone   Intravenous Q6H    Continuous Medications:  . 0.9 % NaCl with KCl 20 mEq / L 100 mL/hr at 01/02/16 0750     Electrolyte/Renal Profile and Glucose Profile:   Recent Labs Lab 12/27/15 0942 12/31/15 0949 12/31/15 1559 01/01/16 0606 01/02/16 0421  NA 131* 133*  --  137 141  K 2.4* 2.3* 3.1* 3.3* 3.3*  CL 98* 96*  --  110 112*  CO2 25 25  --  22 25  BUN <5* 6  --  5* <5*  CREATININE 0.47 0.72  --  0.54 0.65  CALCIUM 8.7* 9.5  --  8.3* 8.8*  MG 1.6* 1.9  --   --   --   GLUCOSE 124* 142*  --  117* 103*   Protein Profile:  Recent Labs Lab 12/31/15 0949 01/01/16 0606 01/02/16 0421  ALBUMIN 3.9 2.9* 3.0*    Gastrointestinal Profile: Last BM:  01/01/2016   Nutrition-Focused Physical Exam Findings: Nutrition-Focused physical exam completed. Findings are WDL for fat depletion, muscle depletion, and edema.    Weight Change: Pt reports weight of 175lbs right before her birthday, the first of January 2016. (12% weight loss in 2 months)  Height:   Ht Readings from Last 1 Encounters:  12/31/15 5\' 4"  (1.626 m)    Weight:   Wt Readings from Last 1 Encounters:  12/31/15 154 lb 6 oz (70.024 kg)     BMI:  Body mass index is 26.49 kg/(m^2).  Estimated Nutritional Needs:   Kcal:  BEE: 1330kcals, TEE: (IF 1.1-1.3)(AF 1.2) 1755-2054kcals  Protein:  77-91g protein (1.1-1.3g/kg)  Fluid:  1750-214mL of fluid (25-21mL/kg)  EDUCATION NEEDS:   No education needs identified at this time   Hanna, RD, LDN Pager [phone] Weekend/On-Call Pager [phone]

## 2016-01-02 NOTE — Progress Notes (Signed)
CT reviewed. Massive gastric distension. Some passage of contrast distally.  Minimal proximal small bowel prominence with the remaining bowel decompressed. Contrast does reach the right colon. 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  Attempts at feeding in face of massive gastric distention likely to be unsuccessful. Whether this is neurogenic secondary to radiation, or delayed gastric emptying due to the posterior gastric wall mass from the pancreas is unclear.  I would recommend nasogastric decompression for several days and then consider barium swallow to assess gastric emptying.  If poor gastric emptying is confirmed and persists, bypass could be considered.  Correction of hypokalemia although modest may improve gastric function.

## 2016-01-02 NOTE — Progress Notes (Signed)
Patient was admitted in hospital so please see initial history and physical and admission note  [place] @ [facility] [phone]  Fax:(336) [place] OB: [birth year]  MR#: [id number]  XVQ#:[id number]  Patient Care Team: Lynnell Jude, MD as PCP - General (Family Medicine) [name], MD (Oncology) Seeplaputhur [name], MD (General Surgery)  CHIEF COMPLAINT:  No chief complaint on file.  Oncology History   1. Carcinoma of the colon, splenic  flexure.   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 2. K-RAS mutated. 3. Patient started on FOLFOX and Avastin 4 th June, 2015 4. Allergic reaction  characterized by generalized itching and redness  of face and palm on June 15, 2014 5. Started on FOLFIRI  and avastin   July 13, 2014 6. Finished 12 cycles of chemotherapy, 5-FU based FOLFOX was changed to FOLFIRI BECAUSE OF SENSITIVITY TO OXALIPLATIN(September 07, 2014) 7. Started on maintenance chemotherapy with 5-FU leucovorin and Avastin from September 28, 2014 8.  Progressive disease based on tumor markers as well as PET scan's so patient was started on FOLFOX chemotherapy with desensitizing  protocol.   9.  Progressing disease by CT scan and by symptoms.  Patient will be started on TAS-102 from October, 2016 10.  Progressing disease on  TAS-102 (December, 2016) 11.patient has been started on palliative radiation therapy to retroperitoneal mass and 5-FU by continuous infusion (December 03, 2015 12.  Patient started in 5-FU by continuous infusion and radiation therapy (February, 2017)      INTERVAL HISTORY: \  45 year old lady with progressing colon cancer.  Losing weight.  Appetite is poor. Dilaudid is helping with the pain without any significant side effect. Patient had persistent nausea last night through a 500 cc of greenish liquid had once or twice  vomiting yesterday.  No abdominal pain.       REVIEW OF SYSTEMS:   GENERAL:  Feels good.  Active.  No fevers, sweats or weight loss. PERFORMANCE STATUS (ECOG): 0 HEENT:  No visual changes, runny nose, sore throat, mouth sores or tenderness.  Mild headache Visual disturbances as described about Lungs: No shortness of breath or cough.  No hemoptysis. Cardiac:  No chest pain, palpitations, orthopnea, or PND. GI: Nausea following chemotherapy lasting for more than 24 hours which is now resolved.  No diarrhea. GU:  No urgency, frequency, dysuria, or hematuria. Musculoskeletal:  Back pain as described above Extremities:  No pain or swelling.  Pain in the left hip after fall.took motrn Skin:  No rashes or skin changes. Neuro:  No headache, numbness or weakness, balance or coordination issues. Endocrine:  No diabetes, thyroid issues, hot flashes or night sweats. Psych:  No mood changes, depression or anxiety. Pain:  No focal pain. Review of systems:  All other systems reviewed and found to be negative.  As per HPI. Otherwise, a complete review of systems is negatve.  PAST MEDICAL HISTORY: Past Medical History  Diagnosis Date  . Ulcer   . Anemia   . Obesity   . Constipation   . Migraines   . Cancer ([place]) 2015    colon  . Colon cancer metastasized to liver [facility]) April 2015    T3, N2, M1, stage IV with biopsy-proven liver metastases. On adjuvant chemotherapy    PAST SURGICAL HISTORY: Past Surgical History  Procedure Laterality Date  . Cholecystectomy  2014  .  Tubal ligation  2005  . Colonoscopy  2015  . Upper gi endoscopy  2015  . Esophagogastroduodenoscopy (egd) with propofol N/A 11/19/2015    Procedure: ESOPHAGOGASTRODUODENOSCOPY (EGD) WITH PROPOFOL;  Surgeon: Kieth Brightly, MD;  Location: [facility] ENDOSCOPY;  Service: Endoscopy;  Laterality: N/A;    FAMILY HISTORY No significant family history of malignancy       ADVANCED DIRECTIVES:   Patient does have advanced  healthcare directive HEALTH MAINTENANCE: Social History  Substance Use Topics  . Smoking status: Never Smoker   . Smokeless tobacco: None  . Alcohol Use: No      Allergies  Allergen Reactions  . Fentanyl Swelling    Swelling of lips  . Oxaliplatin Itching  . Hydrocodone-Acetaminophen Hives    Current Facility-Administered Medications  Medication Dose Route Frequency Provider Last Rate Last Dose  . 0.9 % NaCl with KCl 20 mEq/ L  infusion   Intravenous Continuous Johney Maine, MD 100 mL/hr at 01/02/16 0750    . HYDROmorphone (DILAUDID) injection 1 mg  1 mg Intravenous Q6H PRN Johney Maine, MD   1 mg at 01/02/16 0629  . LORazepam (ATIVAN) injection 0.5 mg  0.5 mg Intravenous q morning - 10a Johney Maine, MD      . metoCLOPramide (REGLAN) injection 10 mg  10 mg Intravenous 4 times per day Johney Maine, MD      . ondansetron (ZOFRAN) 4 mg in sodium chloride 0.9 % 50 mL IVPB   Intravenous Q6H Teaira Croft, MD      . ondansetron (ZOFRAN) 4 mg in sodium chloride 0.9 % 50 mL IVPB   Intravenous Q4H PRN Johney Maine, MD       Facility-Administered Medications Ordered in Other Encounters  Medication Dose Route Frequency Provider Last Rate Last Dose  . sodium chloride 0.9 % injection 10 mL  10 mL Intracatheter PRN Johney Maine, MD   10 mL at 03/21/15 1025  . sodium chloride 0.9 % injection 10 mL  10 mL Intravenous PRN Johney Maine, MD   10 mL at 04/21/15 1025  . sodium chloride 0.9 % injection 10 mL  10 mL Intracatheter PRN Johney Maine, MD   10 mL at 12/03/15 1105    OBJECTIVE:  Filed Vitals:   01/01/16 2140 01/02/16 0448  BP: 119/54 109/58  Pulse: 92 101  Temp: 98.7 F (37.1 C) 98.8 F (37.1 C)  Resp: 18 18     Body mass index is 26.49 kg/(m^2).    ECOG FS:0 - Asymptomatic  PHYSICAL EXAM: GENERAL:  Well developed, well nourished, sitting comfortably in the exam room in no acute distress. MENTAL STATUS:  Alert and oriented to person, place and time. HEAD:  .  Normocephalic,  atraumatic, face symmetric, no Cushingoid features. EYES:   Pupils equal round and reactive to light and accomodation.  No conjunctivitis or scleral icterus. ENT:  Oropharynx clear without lesion.  Tongue normal. Mucous membranes moist.  RESPIRATORY:  Clear to auscultation without rales, wheezes or rhonchi. CARDIOVASCULAR:  Regular rate and rhythm without murmur, rub or gallop. BREAST:  Right breast without masses, skin changes or nipple discharge.  Left breast without masses, skin changes or nipple discharge. ABDOMEN:  Soft, non-tender, with active bowel sounds, and no hepatosplenomegaly.  No masses. BACK:  No CVA tenderness.  No tenderness on percussion of the back or rib cage. SKIN:  No rashes, ulcers or lesions. EXTREMITIES: No edema, no skin discoloration or tenderness.  No palpable cords. LYMPH NODES: No palpable cervical, supraclavicular,  axillary or inguinal adenopathy  NEUROLOGICAL: Unremarkable. PSYCH:  Appropriate.     Admission on 12/31/2015  Component Date Value Ref Range Status  . Potassium 12/31/2015 3.1* 3.5 - 5.1 mmol/L Final  . Sodium 01/01/2016 137  135 - 145 mmol/L Final  . Potassium 01/01/2016 3.3* 3.5 - 5.1 mmol/L Final  . Chloride 01/01/2016 110  101 - 111 mmol/L Final  . CO2 01/01/2016 22  22 - 32 mmol/L Final  . Glucose, Bld 01/01/2016 117* 65 - 99 mg/dL Final  . BUN 01/01/2016 5* 6 - 20 mg/dL Final  . Creatinine, Ser 01/01/2016 0.54  0.44 - 1.00 mg/dL Final  . Calcium 01/01/2016 8.3* 8.9 - 10.3 mg/dL Final  . Total Protein 01/01/2016 5.7* 6.5 - 8.1 g/dL Final  . Albumin 01/01/2016 2.9* 3.5 - 5.0 g/dL Final  . AST 01/01/2016 20  15 - 41 U/L Final  . ALT 01/01/2016 13* 14 - 54 U/L Final  . Alkaline Phosphatase 01/01/2016 48  38 - 126 U/L Final  . Total Bilirubin 01/01/2016 0.7  0.3 - 1.2 mg/dL Final  . GFR calc non Af Amer 01/01/2016 >60  >60 mL/min Final  . GFR calc Af Amer 01/01/2016 >60  >60 mL/min Final   Comment: (NOTE) The eGFR has been calculated  using the CKD EPI equation. This calculation has not been validated in all clinical situations. eGFR's persistently <60 mL/min signify possible Chronic Kidney Disease.   . Anion gap 01/01/2016 5  5 - 15 Final  . WBC 01/01/2016 6.3  3.6 - 11.0 K/uL Final  . RBC 01/01/2016 2.71* 3.80 - 5.20 MIL/uL Final  . Hemoglobin 01/01/2016 8.7* 12.0 - 16.0 g/dL Final   RESULT REPEATED AND VERIFIED  . HCT 01/01/2016 24.9* 35.0 - 47.0 % Final  . MCV 01/01/2016 91.6  80.0 - 100.0 fL Final  . MCH 01/01/2016 31.9  26.0 - 34.0 pg Final  . MCHC 01/01/2016 34.9  32.0 - 36.0 g/dL Final  . RDW 01/01/2016 19.9* 11.5 - 14.5 % Final  . Platelets 01/01/2016 232  150 - 440 K/uL Final  . Neutrophils Relative % 01/01/2016 81%   Final  . Neutro Abs 01/01/2016 5.1  1.4 - 6.5 K/uL Final  . Lymphocytes Relative 01/01/2016 9%   Final  . Lymphs Abs 01/01/2016 0.5* 1.0 - 3.6 K/uL Final  . Monocytes Relative 01/01/2016 10%   Final  . Monocytes Absolute 01/01/2016 0.6  0.2 - 0.9 K/uL Final  . Eosinophils Relative 01/01/2016 0%   Final  . Eosinophils Absolute 01/01/2016 0.0  0 - 0.7 K/uL Final  . Basophils Relative 01/01/2016 0%   Final  . Basophils Absolute 01/01/2016 0.0  0 - 0.1 K/uL Final  . WBC 01/02/2016 6.7  3.6 - 11.0 K/uL Final  . RBC 01/02/2016 2.96* 3.80 - 5.20 MIL/uL Final  . Hemoglobin 01/02/2016 9.4* 12.0 - 16.0 g/dL Final  . HCT 01/02/2016 27.1* 35.0 - 47.0 % Final  . MCV 01/02/2016 91.7  80.0 - 100.0 fL Final  . MCH 01/02/2016 31.8  26.0 - 34.0 pg Final  . MCHC 01/02/2016 34.7  32.0 - 36.0 g/dL Final  . RDW 01/02/2016 19.9* 11.5 - 14.5 % Final  . Platelets 01/02/2016 250  150 - 440 K/uL Final  . Neutrophils Relative % 01/02/2016 77   Final  . Neutro Abs 01/02/2016 5.1  1.4 - 6.5 K/uL Final  . Lymphocytes Relative 01/02/2016 11   Final  . Lymphs Abs 01/02/2016 0.7* 1.0 - 3.6 K/uL Final  .  Monocytes Relative 01/02/2016 12   Final  . Monocytes Absolute 01/02/2016 0.8  0.2 - 0.9 K/uL Final  . Eosinophils  Relative 01/02/2016 0   Final  . Eosinophils Absolute 01/02/2016 0.0  0 - 0.7 K/uL Final  . Basophils Relative 01/02/2016 0   Final  . Basophils Absolute 01/02/2016 0.0  0 - 0.1 K/uL Final  . Sodium 01/02/2016 141  135 - 145 mmol/L Final  . Potassium 01/02/2016 3.3* 3.5 - 5.1 mmol/L Final  . Chloride 01/02/2016 112* 101 - 111 mmol/L Final  . CO2 01/02/2016 25  22 - 32 mmol/L Final  . Glucose, Bld 01/02/2016 103* 65 - 99 mg/dL Final  . BUN 01/02/2016 <5* 6 - 20 mg/dL Final  . Creatinine, Ser 01/02/2016 0.65  0.44 - 1.00 mg/dL Final  . Calcium 01/02/2016 8.8* 8.9 - 10.3 mg/dL Final  . Total Protein 01/02/2016 6.0* 6.5 - 8.1 g/dL Final  . Albumin 01/02/2016 3.0* 3.5 - 5.0 g/dL Final  . AST 01/02/2016 27  15 - 41 U/L Final  . ALT 01/02/2016 14  14 - 54 U/L Final  . Alkaline Phosphatase 01/02/2016 54  38 - 126 U/L Final  . Total Bilirubin 01/02/2016 0.8  0.3 - 1.2 mg/dL Final  . GFR calc non Af Amer 01/02/2016 >60  >60 mL/min Final  . GFR calc Af Amer 01/02/2016 >60  >60 mL/min Final   Comment: (NOTE) The eGFR has been calculated using the CKD EPI equation. This calculation has not been validated in all clinical situations. eGFR's persistently <60 mL/min signify possible Chronic Kidney Disease.   . Anion gap 01/02/2016 4* 5 - 15 Final  Appointment on 12/31/2015  Component Date Value Ref Range Status  . WBC 12/31/2015 6.7  3.6 - 11.0 K/uL Final  . RBC 12/31/2015 3.93  3.80 - 5.20 MIL/uL Final  . Hemoglobin 12/31/2015 12.5  12.0 - 16.0 g/dL Final  . HCT 12/31/2015 35.7  35.0 - 47.0 % Final  . MCV 12/31/2015 91.0  80.0 - 100.0 fL Final  . MCH 12/31/2015 31.8  26.0 - 34.0 pg Final  . MCHC 12/31/2015 35.0  32.0 - 36.0 g/dL Final  . RDW 12/31/2015 20.0* 11.5 - 14.5 % Final  . Platelets 12/31/2015 304  150 - 440 K/uL Final  . Neutrophils Relative % 12/31/2015 79   Final  . Neutro Abs 12/31/2015 5.3  1.4 - 6.5 K/uL Final  . Lymphocytes Relative 12/31/2015 11   Final  . Lymphs Abs  12/31/2015 0.7* 1.0 - 3.6 K/uL Final  . Monocytes Relative 12/31/2015 9   Final  . Monocytes Absolute 12/31/2015 0.6  0.2 - 0.9 K/uL Final  . Eosinophils Relative 12/31/2015 1   Final  . Eosinophils Absolute 12/31/2015 0.1  0 - 0.7 K/uL Final  . Basophils Relative 12/31/2015 0   Final  . Basophils Absolute 12/31/2015 0.0  0 - 0.1 K/uL Final  . Sodium 12/31/2015 133* 135 - 145 mmol/L Final  . Potassium 12/31/2015 2.3* 3.5 - 5.1 mmol/L Final   Comment: RESULTS VERIFIED BY REPEAT TESTING CRITICAL RESULT CALLED TO, READ BACK BY AND VERIFIED WITH HAYLEY RHODE AT 1013 12/31/2015 KMR   . Chloride 12/31/2015 96* 101 - 111 mmol/L Final  . CO2 12/31/2015 25  22 - 32 mmol/L Final  . Glucose, Bld 12/31/2015 142* 65 - 99 mg/dL Final  . BUN 12/31/2015 6  6 - 20 mg/dL Final  . Creatinine, Ser 12/31/2015 0.72  0.44 - 1.00 mg/dL Final  .  Calcium 12/31/2015 9.5  8.9 - 10.3 mg/dL Final  . Total Protein 12/31/2015 8.0  6.5 - 8.1 g/dL Final  . Albumin 12/31/2015 3.9  3.5 - 5.0 g/dL Final  . AST 12/31/2015 25  15 - 41 U/L Final  . ALT 12/31/2015 18  14 - 54 U/L Final  . Alkaline Phosphatase 12/31/2015 75  38 - 126 U/L Final  . Total Bilirubin 12/31/2015 1.2  0.3 - 1.2 mg/dL Final  . GFR calc non Af Amer 12/31/2015 >60  >60 mL/min Final  . GFR calc Af Amer 12/31/2015 >60  >60 mL/min Final   Comment: (NOTE) The eGFR has been calculated using the CKD EPI equation. This calculation has not been validated in all clinical situations. eGFR's persistently <60 mL/min signify possible Chronic Kidney Disease.   . Anion gap 12/31/2015 12  5 - 15 Final  . Magnesium 12/31/2015 1.9  1.7 - 2.4 mg/dL Final    No results found for: LABCA2 Lab Results  Component Value Date   CA199 <1 02/03/2014   Lab Results  Component Value Date   CEA 6.2* 11/12/2015   Component     Latest Ref Rng 01/25/2015 03/08/2015 03/21/2015 04/18/2015 05/23/2015  CEA     0.0 - 4.7 ng/mL 4.8 (H) 6.0 (H) 5.0 (H) 4.2 4.7   Component      Latest Ref Rng 06/13/2015  CEA     0.0 - 4.7 ng/mL 4.4         ASSESSMENT:  1.  Carcinoma of colon stage IV disease progressing disease based on CT scan.    Intractable nausea and vomiting Patient had a previous history of cholecystectomy. Abdomen is very soft and benign Add Pepcid intravenous Switch all medication to IV again Add a small dose of Decadron to see whether that would help Exact etiology of farm vomiting is not clear we will get a surgical opinion to see whether any evidence of gastric outlet obstruction cannot be ruled out if needed. I discussed situation with patient's mother about overall prognosis And placed a call for Dr. Bary Castilla, from surgical service to evaluate patient    Staging form: Colon and Rectum, AJCC 7th Edition     Clinical: Stage IVB (yT3, N1, M1b) - Signed by [name], MD on 03/07/2015   [name], MD   01/02/2016 9:41 AM  [place] @ [place] [facility]

## 2016-01-02 NOTE — Consult Note (Signed)
Reason for Consult: Nausea and vomiting Referring Physician: Blain Pais, M.D.  Misty Mack is an 45 y.o. female. Admitted with a two-week history of nausea and vomiting. HPI: The patient reports she was at baseline until 3 weeks ago when she began to appreciate early satiety. She denied any pain with eating. She had been evaluated for hematemesis in January 2017 with an EGD which did not show evidence of ulcer disease. 2 weeks ago she began to have almost daily episodes of vomiting, usually in the early morning hours. On arising she would feel somewhat better, eat modest amounts during this time and then the next morning vomit once again. It did not appear to make difference what she ate or drank as to whether she would have vomiting. Her symptoms persisted and she was subsequently admitted for further evaluation.  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 While she was making use of narcotics for pain control her bowel movements became infrequent, but she reports that she stopped her oral narcotics 1 week ago and had resumed fairly normal, formed stools. Her last bowel movement was yesterday.  Past Medical History  Diagnosis Date  . Ulcer   . Anemia   . Obesity   . Constipation   . Migraines   . Cancer ([place]) 2015    colon  . Colon cancer metastasized to liver [facility]) April 2015    T3, N2, M1, stage IV with biopsy-proven liver metastases. On adjuvant chemotherapy    Past Surgical History  Procedure Laterality Date  . Cholecystectomy  2014  . Tubal ligation  2005  . Colonoscopy  2015  . Upper gi endoscopy  2015  . Esophagogastroduodenoscopy (egd) with propofol N/A 11/19/2015    Procedure: ESOPHAGOGASTRODUODENOSCOPY (EGD) WITH PROPOFOL;  Surgeon: Christene Lye, MD;  Location: [facility] ENDOSCOPY;  Service: Endoscopy;  Laterality: N/A;    History  reviewed. No pertinent family history.  Social History:  reports that she has never smoked. She does not have any smokeless tobacco history on file. She reports that she does not drink alcohol or use illicit drugs.  Allergies:  Allergies  Allergen Reactions  . Fentanyl Swelling    Swelling of lips  . Oxaliplatin Itching  . Hydrocodone-Acetaminophen Hives    Medications: Reviewed  Results for orders placed or performed during the hospital encounter of 12/31/15 (from the past 48 hour(s))  Potassium     Status: Abnormal   Collection Time: 12/31/15  3:59 PM  Result Value Ref Range   Potassium 3.1 (L) 3.5 - 5.1 mmol/L  Comprehensive metabolic panel     Status: Abnormal   Collection Time: 01/01/16  6:06 AM  Result Value Ref Range   Sodium 137 135 - 145 mmol/L   Potassium 3.3 (L) 3.5 - 5.1 mmol/L   Chloride 110 101 - 111 mmol/L   CO2 22 22 - 32 mmol/L   Glucose, Bld 117 (H) 65 - 99 mg/dL   BUN 5 (L) 6 - 20 mg/dL   Creatinine, Ser 0.54 0.44 - 1.00 mg/dL   Calcium 8.3 (L) 8.9 - 10.3 mg/dL   Total Protein 5.7 (L) 6.5 - 8.1 g/dL   Albumin 2.9 (L) 3.5 - 5.0 g/dL   AST 20 15 - 41 U/L   ALT 13 (L) 14 - 54 U/L   Alkaline Phosphatase 48 38 - 126 U/L   Total Bilirubin 0.7 0.3 -  1.2 mg/dL   GFR calc non Af Amer >60 >60 mL/min   GFR calc Af Amer >60 >60 mL/min    Comment: (NOTE) The eGFR has been calculated using the CKD EPI equation. This calculation has not been validated in all clinical situations. eGFR's persistently <60 mL/min signify possible Chronic Kidney Disease.    Anion gap 5 5 - 15  CBC with Differential/Platelet     Status: Abnormal   Collection Time: 01/01/16  6:06 AM  Result Value Ref Range   WBC 6.3 3.6 - 11.0 K/uL   RBC 2.71 (L) 3.80 - 5.20 MIL/uL   Hemoglobin 8.7 (L) 12.0 - 16.0 g/dL    Comment: RESULT REPEATED AND VERIFIED   HCT 24.9 (L) 35.0 - 47.0 %   MCV 91.6 80.0 - 100.0 fL   MCH 31.9 26.0 - 34.0 pg   MCHC 34.9 32.0 - 36.0 g/dL   RDW 19.9 (H) 11.5 - 14.5 %    Platelets 232 150 - 440 K/uL   Neutrophils Relative % 81% %   Neutro Abs 5.1 1.4 - 6.5 K/uL   Lymphocytes Relative 9% %   Lymphs Abs 0.5 (L) 1.0 - 3.6 K/uL   Monocytes Relative 10% %   Monocytes Absolute 0.6 0.2 - 0.9 K/uL   Eosinophils Relative 0% %   Eosinophils Absolute 0.0 0 - 0.7 K/uL   Basophils Relative 0% %   Basophils Absolute 0.0 0 - 0.1 K/uL  CBC with Differential/Platelet     Status: Abnormal   Collection Time: 01/02/16  4:21 AM  Result Value Ref Range   WBC 6.7 3.6 - 11.0 K/uL   RBC 2.96 (L) 3.80 - 5.20 MIL/uL   Hemoglobin 9.4 (L) 12.0 - 16.0 g/dL   HCT 27.1 (L) 35.0 - 47.0 %   MCV 91.7 80.0 - 100.0 fL   MCH 31.8 26.0 - 34.0 pg   MCHC 34.7 32.0 - 36.0 g/dL   RDW 19.9 (H) 11.5 - 14.5 %   Platelets 250 150 - 440 K/uL   Neutrophils Relative % 77 %   Neutro Abs 5.1 1.4 - 6.5 K/uL   Lymphocytes Relative 11 %   Lymphs Abs 0.7 (L) 1.0 - 3.6 K/uL   Monocytes Relative 12 %   Monocytes Absolute 0.8 0.2 - 0.9 K/uL   Eosinophils Relative 0 %   Eosinophils Absolute 0.0 0 - 0.7 K/uL   Basophils Relative 0 %   Basophils Absolute 0.0 0 - 0.1 K/uL  Comprehensive metabolic panel     Status: Abnormal   Collection Time: 01/02/16  4:21 AM  Result Value Ref Range   Sodium 141 135 - 145 mmol/L   Potassium 3.3 (L) 3.5 - 5.1 mmol/L   Chloride 112 (H) 101 - 111 mmol/L   CO2 25 22 - 32 mmol/L   Glucose, Bld 103 (H) 65 - 99 mg/dL   BUN <5 (L) 6 - 20 mg/dL   Creatinine, Ser 0.65 0.44 - 1.00 mg/dL   Calcium 8.8 (L) 8.9 - 10.3 mg/dL   Total Protein 6.0 (L) 6.5 - 8.1 g/dL   Albumin 3.0 (L) 3.5 - 5.0 g/dL   AST 27 15 - 41 U/L   ALT 14 14 - 54 U/L   Alkaline Phosphatase 54 38 - 126 U/L   Total Bilirubin 0.8 0.3 - 1.2 mg/dL   GFR calc non Af Amer >60 >60 mL/min   GFR calc Af Amer >60 >60 mL/min    Comment: (NOTE) The eGFR has been  calculated using the CKD EPI equation. This calculation has not been validated in all clinical situations. eGFR's persistently <60 mL/min signify  possible Chronic Kidney Disease.    Anion gap 4 (L) 5 - 15    Dg Abd 2 Views  01/02/2016  CLINICAL DATA:  Nausea, vomiting. EXAM: ABDOMEN - 2 VIEW COMPARISON:  12/31/2015 FINDINGS: Prior cholecystectomy. Nonobstructive bowel gas pattern. No free air organomegaly. Visualized lung bases are clear. No acute bony abnormality. IMPRESSION: No evidence of bowel obstruction or free air. Electronically Signed   By: Rolm Baptise M.D.   On: 01/02/2016 14:10   Dg Abd Acute W/chest  12/31/2015  CLINICAL DATA:  45 year old female with history of colon cancer on palliative chemotherapy presenting with increasing back pain, severe nausea and vomiting. EXAM: DG ABDOMEN ACUTE W/ 1V CHEST COMPARISON:  Abdominal radiograph 03/15/2014. CT the abdomen and pelvis 10/19/2015. FINDINGS: Left-sided subclavian single-lumen porta cath with tip terminating in the distal superior vena cava. Lung volumes are normal. No consolidative airspace disease. No pleural effusions. No pneumothorax. No pulmonary nodule or mass noted. Pulmonary vasculature and the cardiomediastinal silhouette are within normal limits. Surgical clips project over the right upper quadrant of the abdomen, related to prior cholecystectomy. Multiple surgical clips are also noted in the left side of the upper abdomen. Suture line in the expected location of the splenic flexure of the colon. Surgical clip in the low anatomic pelvis. Gas and stool are seen scattered throughout the colon extending to the level of the distal rectum. No pathologic distension of small bowel is noted. No gross evidence of pneumoperitoneum. IMPRESSION: 1. Nonobstructive bowel gas pattern. 2. No pneumoperitoneum. 3. No radiographic evidence of acute cardiopulmonary disease. 4. Postoperative changes and support apparatus, as above. Electronically Signed   By: Vinnie Langton M.D.   On: 12/31/2015 18:20    Review of Systems  Constitutional: Negative.  Negative for weight loss.  HENT: Negative.    Eyes: Negative.   Respiratory: Negative.   Cardiovascular: Negative.   Gastrointestinal: Positive for nausea and vomiting.  Genitourinary: Negative.   Musculoskeletal: Negative.   Skin: Negative.   Neurological: Negative.   Endo/Heme/Allergies: Negative.    Blood pressure 119/60, pulse 94, temperature 97.9 F (36.6 C), temperature source Oral, resp. rate 18, height _0  (1.626 m), weight 154 lb 6 oz (70.024 kg), last menstrual period 12/18/2015, SpO2 100 %. Physical Exam  Constitutional: She is oriented to person, place, and time. She appears well-developed and well-nourished.  Eyes: Conjunctivae are normal.  Neck: Neck supple. No thyromegaly present.  Cardiovascular: Normal rate and regular rhythm.   Pulses:      Femoral pulses are 2+ on the right side, and 2+ on the left side. Respiratory: Effort normal and breath sounds normal.  GI: Soft. She exhibits distension and abdominal bruit. There is no rebound.    Musculoskeletal: Normal range of motion.  Lymphadenopathy:    She has no cervical adenopathy.       Right: No inguinal adenopathy present.       Left: No inguinal adenopathy present.  Neurological: She is alert and oriented to person, place, and time. She has normal reflexes.  Skin: Skin is warm and dry.   DATA REVIEW Plain films of the abdomen obtained on admission showed a markedly distended stomach with an air-fluid level under the left hemidiaphragm. Paucity of colonic or small bowel air. No free air.  EGD of 11/19/2015 showed patchy erythematous mucosa in the stomach. Biopsies were negative for malignancy  or H. pylori. Duodenum was described as normal.  CT of December 2016 reviewed. Pancreatic mass impinging on the back of the stomach, multiple hepatic metastases. No small bowel dilatation. No colonic dilatation.  Laboratory studies showed mild, stable anemia. Normal white blood cell count. Normal liver function studies and bilirubin. Minimally depressed albumin at  3.0. Mild hypokalemia at 3.3.  Assessment/Plan: Repeat films of the stomach confirmed persistent gastric dilatation with an air-fluid level.  The patient may have developed neurogenic changes in the stomach secondary to thoracic radiation or retroperitoneal changes from her known pancreatic mass versus proximal small bowel/duodenal obstruction.  Options for imaging reviewed with Dr. Posey Pronto from radiology. CT with contrast recommended.  Case reviewed with Dr. Oliva Bustard.  Robert Bellow 01/02/2016, 2:41 PM

## 2016-01-02 NOTE — Progress Notes (Addendum)
Pt with large emesis (500 cc green clear liquid).  PRN dose of IV zofran given. Dorna Bloom RN

## 2016-01-03 ENCOUNTER — Ambulatory Visit: Payer: 59

## 2016-01-03 ENCOUNTER — Inpatient Hospital Stay: Payer: 59

## 2016-01-03 ENCOUNTER — Inpatient Hospital Stay: Payer: 59 | Admitting: Oncology

## 2016-01-03 DIAGNOSIS — G893 Neoplasm related pain (acute) (chronic): Secondary | ICD-10-CM

## 2016-01-03 LAB — COMPREHENSIVE METABOLIC PANEL
ALBUMIN: 3.3 g/dL — AB (ref 3.5–5.0)
ALK PHOS: 59 U/L (ref 38–126)
ALT: 24 U/L (ref 14–54)
AST: 36 U/L (ref 15–41)
Anion gap: 5 (ref 5–15)
CALCIUM: 9 mg/dL (ref 8.9–10.3)
CO2: 25 mmol/L (ref 22–32)
CREATININE: 0.68 mg/dL (ref 0.44–1.00)
Chloride: 109 mmol/L (ref 101–111)
GFR calc Af Amer: >60 mL/min (ref >60)
GFR calc non Af Amer: >60 mL/min (ref >60)
GLUCOSE: 97 mg/dL (ref 65–99)
Potassium: 3.2 mmol/L — ABNORMAL LOW (ref 3.5–5.1)
SODIUM: 139 mmol/L (ref 135–145)
Total Bilirubin: 1 mg/dL (ref 0.3–1.2)
Total Protein: 6.4 g/dL — ABNORMAL LOW (ref 6.5–8.1)

## 2016-01-03 LAB — AMYLASE: AMYLASE: 29 U/L (ref 28–100)

## 2016-01-03 LAB — LIPASE, BLOOD: Lipase: 18 U/L (ref 11–51)

## 2016-01-03 MED ORDER — ZOLPIDEM TARTRATE 5 MG PO TABS
5.0000 mg | ORAL_TABLET | Freq: Once | ORAL | Status: AC
  Administered 2016-01-03: 02:00:00 5 mg via ORAL
  Filled 2016-01-03: qty 1

## 2016-01-03 MED ORDER — PHENOL 1.4 % MT LIQD
1.0000 | OROMUCOSAL | Status: DC | PRN
Start: 2016-01-03 — End: 2016-01-18
  Administered 2016-01-03 – 2016-01-05 (×9): 1 via OROMUCOSAL
  Filled 2016-01-03 (×2): qty 177

## 2016-01-03 MED ORDER — DIPHENHYDRAMINE HCL 50 MG/ML IJ SOLN
25.0000 mg | Freq: Once | INTRAMUSCULAR | Status: AC
  Administered 2016-01-03: 25 mg via INTRAVENOUS
  Filled 2016-01-03: qty 1

## 2016-01-03 MED ORDER — TRAVASOL 10 % IV SOLN
INTRAVENOUS | Status: DC
  Filled 2016-01-03: qty 600

## 2016-01-03 NOTE — Progress Notes (Signed)
Patient was admitted in hospital so please see initial history and physical and admission note  [place] @ [facility] [phone]  Fax:([phone]) [place] OB: 1971/08/22  MR#: [id number]  NAT#:[id number]  Patient Care Team: Lynnell Jude, MD as PCP - General (Family Medicine) [name], MD (Oncology) Seeplaputhur [name], MD (General Surgery)  CHIEF COMPLAINT:  No chief complaint on file.  Oncology History   1. Carcinoma of the colon, splenic  flexure.   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 2. K-RAS mutated. 3. Patient started on FOLFOX and Avastin 4 th June, 2015 4. Allergic reaction  characterized by generalized itching and redness  of face and palm on June 15, 2014 5. Started on FOLFIRI  and avastin   July 13, 2014 6. Finished 12 cycles of chemotherapy, 5-FU based FOLFOX was changed to FOLFIRI BECAUSE OF SENSITIVITY TO OXALIPLATIN(September 07, 2014) 7. Started on maintenance chemotherapy with 5-FU leucovorin and Avastin from September 28, 2014 8.  Progressive disease based on tumor markers as well as PET scan's so patient was started on FOLFOX chemotherapy with desensitizing  protocol.   9.  Progressing disease by CT scan and by symptoms.  Patient will be started on TAS-102 from October, 2016 10.  Progressing disease on  TAS-102 (December, 2016) 11.patient has been started on palliative radiation therapy to retroperitoneal mass and 5-FU by continuous infusion (December 03, 2015 12.  Patient started in 5-FU by continuous infusion and radiation therapy (February, 2017)      INTERVAL HISTORY: \  45 year old lady with progressing colon cancer.  Losing weight.  Appetite is poor. Dilaudid is helping with the pain without any significant side effect. Patient had persistent nausea last night through a 500 cc of greenish liquid had once or twice  vomiting yesterday.  No abdominal pain. Patient continues to nausea and vomiting CT scan and plain neck serum abdomen even though reported within normal limit excessive stomach dilated patient was found.  Surgical evaluation suggested possibility of gastric tube placement and decompression.       REVIEW OF SYSTEMS:   GENERAL:  Feels good.  Active.  No fevers, sweats or weight loss. PERFORMANCE STATUS (ECOG): 0 HEENT:  No visual changes, runny nose, sore throat, mouth sores or tenderness.  Mild headache Visual disturbances as described about Lungs: No shortness of breath or cough.  No hemoptysis. Cardiac:  No chest pain, palpitations, orthopnea, or PND. GI: Nausea following chemotherapy lasting for more than 24 hours which is now resolved.  No diarrhea. GU:  No urgency, frequency, dysuria, or hematuria. Musculoskeletal:  Back pain as described above Extremities:  No pain or swelling.  Pain in the left hip after fall.took motrn Skin:  No rashes or skin changes. Neuro:  No headache, numbness or weakness, balance or coordination issues. Endocrine:  No diabetes, thyroid issues, hot flashes or night sweats. Psych:  No mood changes, depression or anxiety. Pain:  No focal pain. Review of systems:  All other systems reviewed and found to be negative.  As per HPI. Otherwise, a complete review of systems is negatve.  PAST MEDICAL HISTORY: Past Medical History  Diagnosis Date  . Ulcer   . Anemia   . Obesity   . Constipation   . Migraines   . Cancer ([place]) 2015    colon  . Colon cancer metastasized to liver (  [place]) April 2015    T3, N2, M1, stage IV with biopsy-proven liver metastases. On adjuvant chemotherapy    PAST SURGICAL HISTORY: Past Surgical History  Procedure Laterality Date  . Cholecystectomy  2014  . Tubal ligation  2005  . Colonoscopy  2015  . Upper gi endoscopy  2015  . Esophagogastroduodenoscopy (egd) with propofol N/A 11/19/2015    Procedure: ESOPHAGOGASTRODUODENOSCOPY  (EGD) WITH PROPOFOL;  Surgeon: Christene Lye, MD;  Location: [facility] ENDOSCOPY;  Service: Endoscopy;  Laterality: N/A;    FAMILY HISTORY No significant family history of malignancy       ADVANCED DIRECTIVES:   Patient does have advanced healthcare directive HEALTH MAINTENANCE: Social History  Substance Use Topics  . Smoking status: Never Smoker   . Smokeless tobacco: None  . Alcohol Use: No      Allergies  Allergen Reactions  . Fentanyl Swelling    Swelling of lips  . Oxaliplatin Itching  . Hydrocodone-Acetaminophen Hives    Current Facility-Administered Medications  Medication Dose Route Frequency Provider Last Rate Last Dose  . 0.9 % NaCl with KCl 20 mEq/ L  infusion   Intravenous Continuous [name], MD 100 mL/hr at 01/03/16 1431    . acetaminophen (TYLENOL) tablet 650 mg  650 mg Oral Q8H PRN Cammie Sickle, MD   650 mg at 01/02/16 1907  . famotidine (PEPCID) IVPB 20 mg premix  20 mg Intravenous Q24H [name], MD   20 mg at 01/03/16 0946  . feeding supplement (BOOST / RESOURCE BREEZE) liquid 1 Container  1 Container Oral BID BM [name], MD   1 Container at 01/02/16 1400  . HYDROmorphone (DILAUDID) injection 1 mg  1 mg Intravenous Q6H PRN [name], MD   1 mg at 01/02/16 0629  . LORazepam (ATIVAN) injection 0.5 mg  0.5 mg Intravenous q morning - 10a [name], MD   0.5 mg at 01/03/16 0947  . metoCLOPramide (REGLAN) injection 10 mg  10 mg Intravenous 4 times per day [name], MD   10 mg at 01/03/16 1701  . ondansetron (ZOFRAN) injection 4 mg  4 mg Intravenous 4 times per day [name], MD   4 mg at 01/03/16 1700  . ondansetron (ZOFRAN) injection 4 mg  4 mg Intravenous Q4H PRN [name], MD      . phenol (CHLORASEPTIC) mouth spray 1 spray  1 spray Mouth/Throat Q2H PRN [name], MD       Facility-Administered Medications Ordered in Other Encounters  Medication Dose Route Frequency Provider Last Rate Last Dose  . sodium chloride  0.9 % injection 10 mL  10 mL Intracatheter PRN [name], MD   10 mL at 03/21/15 1025  . sodium chloride 0.9 % injection 10 mL  10 mL Intravenous PRN [name], MD   10 mL at 04/21/15 1025  . sodium chloride 0.9 % injection 10 mL  10 mL Intracatheter PRN [name], MD   10 mL at 12/03/15 1105    OBJECTIVE:  Filed Vitals:   01/03/16 0513 01/03/16 1351  BP: 116/41 121/61  Pulse: 85 88  Temp: 99.6 F (37.6 C) 97.5 F (36.4 C)  Resp: 18      Body mass index is 26.49 kg/(m^2).    ECOG FS:0 - Asymptomatic  PHYSICAL EXAM: GENERAL:  Well developed, well nourished, sitting comfortably in the exam room in no acute distress. MENTAL STATUS:  Alert and oriented to person, place and time. HEAD:  .  Normocephalic, atraumatic,  face symmetric, no Cushingoid features. EYES:   Pupils equal round and reactive to light and accomodation.  No conjunctivitis or scleral icterus. ENT:  Oropharynx clear without lesion.  Tongue normal. Mucous membranes moist.  RESPIRATORY:  Clear to auscultation without rales, wheezes or rhonchi. CARDIOVASCULAR:  Regular rate and rhythm without murmur, rub or gallop. BREAST:  Right breast without masses, skin changes or nipple discharge.  Left breast without masses, skin changes or nipple discharge. ABDOMEN:  Soft, non-tender, with active bowel sounds, and no hepatosplenomegaly.  No masses. BACK:  No CVA tenderness.  No tenderness on percussion of the back or rib cage. SKIN:  No rashes, ulcers or lesions. EXTREMITIES: No edema, no skin discoloration or tenderness.  No palpable cords. LYMPH NODES: No palpable cervical, supraclavicular, axillary or inguinal adenopathy  NEUROLOGICAL: Unremarkable. PSYCH:  Appropriate.     Admission on 12/31/2015  Component Date Value Ref Range Status  . Potassium 12/31/2015 3.1* 3.5 - 5.1 mmol/L Final  . Sodium 01/01/2016 137  135 - 145 mmol/L Final  . Potassium 01/01/2016 3.3* 3.5 - 5.1 mmol/L Final  . Chloride 01/01/2016 110   101 - 111 mmol/L Final  . CO2 01/01/2016 22  22 - 32 mmol/L Final  . Glucose, Bld 01/01/2016 117* 65 - 99 mg/dL Final  . BUN 01/01/2016 5* 6 - 20 mg/dL Final  . Creatinine, Ser 01/01/2016 0.54  0.44 - 1.00 mg/dL Final  . Calcium 01/01/2016 8.3* 8.9 - 10.3 mg/dL Final  . Total Protein 01/01/2016 5.7* 6.5 - 8.1 g/dL Final  . Albumin 01/01/2016 2.9* 3.5 - 5.0 g/dL Final  . AST 01/01/2016 20  15 - 41 U/L Final  . ALT 01/01/2016 13* 14 - 54 U/L Final  . Alkaline Phosphatase 01/01/2016 48  38 - 126 U/L Final  . Total Bilirubin 01/01/2016 0.7  0.3 - 1.2 mg/dL Final  . GFR calc non Af Amer 01/01/2016 >60  >60 mL/min Final  . GFR calc Af Amer 01/01/2016 >60  >60 mL/min Final   Comment: (NOTE) The eGFR has been calculated using the CKD EPI equation. This calculation has not been validated in all clinical situations. eGFR's persistently <60 mL/min signify possible Chronic Kidney Disease.   . Anion gap 01/01/2016 5  5 - 15 Final  . WBC 01/01/2016 6.3  3.6 - 11.0 K/uL Final  . RBC 01/01/2016 2.71* 3.80 - 5.20 MIL/uL Final  . Hemoglobin 01/01/2016 8.7* 12.0 - 16.0 g/dL Final   RESULT REPEATED AND VERIFIED  . HCT 01/01/2016 24.9* 35.0 - 47.0 % Final  . MCV 01/01/2016 91.6  80.0 - 100.0 fL Final  . MCH 01/01/2016 31.9  26.0 - 34.0 pg Final  . MCHC 01/01/2016 34.9  32.0 - 36.0 g/dL Final  . RDW 01/01/2016 19.9* 11.5 - 14.5 % Final  . Platelets 01/01/2016 232  150 - 440 K/uL Final  . Neutrophils Relative % 01/01/2016 81%   Final  . Neutro Abs 01/01/2016 5.1  1.4 - 6.5 K/uL Final  . Lymphocytes Relative 01/01/2016 9%   Final  . Lymphs Abs 01/01/2016 0.5* 1.0 - 3.6 K/uL Final  . Monocytes Relative 01/01/2016 10%   Final  . Monocytes Absolute 01/01/2016 0.6  0.2 - 0.9 K/uL Final  . Eosinophils Relative 01/01/2016 0%   Final  . Eosinophils Absolute 01/01/2016 0.0  0 - 0.7 K/uL Final  . Basophils Relative 01/01/2016 0%   Final  . Basophils Absolute 01/01/2016 0.0  0 - 0.1 K/uL Final  . WBC  01/02/2016 6.7  3.6 - 11.0 K/uL Final  . RBC 01/02/2016 2.96* 3.80 - 5.20 MIL/uL Final  . Hemoglobin 01/02/2016 9.4* 12.0 - 16.0 g/dL Final  . HCT 01/02/2016 27.1* 35.0 - 47.0 % Final  . MCV 01/02/2016 91.7  80.0 - 100.0 fL Final  . MCH 01/02/2016 31.8  26.0 - 34.0 pg Final  . MCHC 01/02/2016 34.7  32.0 - 36.0 g/dL Final  . RDW 01/02/2016 19.9* 11.5 - 14.5 % Final  . Platelets 01/02/2016 250  150 - 440 K/uL Final  . Neutrophils Relative % 01/02/2016 77   Final  . Neutro Abs 01/02/2016 5.1  1.4 - 6.5 K/uL Final  . Lymphocytes Relative 01/02/2016 11   Final  . Lymphs Abs 01/02/2016 0.7* 1.0 - 3.6 K/uL Final  . Monocytes Relative 01/02/2016 12   Final  . Monocytes Absolute 01/02/2016 0.8  0.2 - 0.9 K/uL Final  . Eosinophils Relative 01/02/2016 0   Final  . Eosinophils Absolute 01/02/2016 0.0  0 - 0.7 K/uL Final  . Basophils Relative 01/02/2016 0   Final  . Basophils Absolute 01/02/2016 0.0  0 - 0.1 K/uL Final  . Sodium 01/02/2016 141  135 - 145 mmol/L Final  . Potassium 01/02/2016 3.3* 3.5 - 5.1 mmol/L Final  . Chloride 01/02/2016 112* 101 - 111 mmol/L Final  . CO2 01/02/2016 25  22 - 32 mmol/L Final  . Glucose, Bld 01/02/2016 103* 65 - 99 mg/dL Final  . BUN 01/02/2016 <5* 6 - 20 mg/dL Final  . Creatinine, Ser 01/02/2016 0.65  0.44 - 1.00 mg/dL Final  . Calcium 01/02/2016 8.8* 8.9 - 10.3 mg/dL Final  . Total Protein 01/02/2016 6.0* 6.5 - 8.1 g/dL Final  . Albumin 01/02/2016 3.0* 3.5 - 5.0 g/dL Final  . AST 01/02/2016 27  15 - 41 U/L Final  . ALT 01/02/2016 14  14 - 54 U/L Final  . Alkaline Phosphatase 01/02/2016 54  38 - 126 U/L Final  . Total Bilirubin 01/02/2016 0.8  0.3 - 1.2 mg/dL Final  . GFR calc non Af Amer 01/02/2016 >60  >60 mL/min Final  . GFR calc Af Amer 01/02/2016 >60  >60 mL/min Final   Comment: (NOTE) The eGFR has been calculated using the CKD EPI equation. This calculation has not been validated in all clinical situations. eGFR's persistently <60 mL/min signify  possible Chronic Kidney Disease.   . Anion gap 01/02/2016 4* 5 - 15 Final  . Amylase 01/03/2016 29  28 - 100 U/L Final  . Lipase 01/03/2016 18  11 - 51 U/L Final    No results found for: LABCA2 Lab Results  Component Value Date   CA199 <1 02/03/2014   Lab Results  Component Value Date   CEA 6.2* 11/12/2015   Component     Latest Ref Rng 01/25/2015 03/08/2015 03/21/2015 04/18/2015 05/23/2015  CEA     0.0 - 4.7 ng/mL 4.8 (H) 6.0 (H) 5.0 (H) 4.2 4.7   Component     Latest Ref Rng 06/13/2015  CEA     0.0 - 4.7 ng/mL 4.4         ASSESSMENT:  1.  Carcinoma of colon stage IV disease progressing disease based on CT scan.    Intractable nausea and vomiting may be secondary to either neurogenic stomach versus some form of partial obstruction Proceed with NG tube suction In sooner possibility of TPN will discuss tomorrow with nurses regarding TPN and possibility of giving TPN through 4 versus requirement of further  central line  I discussed that possibility with the patient regarding NG tube when she was in agreement with it.  Patient may be a candidate for bypass surgery and will discuss that with Dr. Tollie Pizza Check left tomorrow I discussed that situation with Dr.B my associate and he will follow this patient over the weekend     Clinical: Stage IVB (yT3, N1, M1b) - Signed by [name], MD on 03/07/2015   [name], MD   01/03/2016 8:17 PM  [place] @ [facility]

## 2016-01-03 NOTE — Progress Notes (Signed)
Post NG tube placement KUB reviewed. Within the stomach. Stomach still dilated. By nursing report, large emesis with tube placement.  Small-diameter tube may be inefficient to empty the stomach, but reasonable to give an attempt. Once stomach is decompressed. Study to better assess the proximal jejunum would be appropriate.  Patient could be considered a candidate for bypass if indicated.

## 2016-01-04 ENCOUNTER — Ambulatory Visit: Payer: 59

## 2016-01-04 DIAGNOSIS — C189 Malignant neoplasm of colon, unspecified: Secondary | ICD-10-CM

## 2016-01-04 DIAGNOSIS — R143 Flatulence: Secondary | ICD-10-CM

## 2016-01-04 DIAGNOSIS — C799 Secondary malignant neoplasm of unspecified site: Secondary | ICD-10-CM

## 2016-01-04 DIAGNOSIS — Z978 Presence of other specified devices: Secondary | ICD-10-CM

## 2016-01-04 DIAGNOSIS — K311 Adult hypertrophic pyloric stenosis: Secondary | ICD-10-CM

## 2016-01-04 DIAGNOSIS — E46 Unspecified protein-calorie malnutrition: Secondary | ICD-10-CM

## 2016-01-04 LAB — CBC WITH DIFFERENTIAL/PLATELET
Basophils Absolute: 0 10*3/uL (ref 0–0.1)
Basophils Relative: 0 %
EOS ABS: 0 10*3/uL (ref 0–0.7)
Eosinophils Relative: 0 %
HEMATOCRIT: 27.9 % — AB (ref 35.0–47.0)
HEMOGLOBIN: 9.6 g/dL — AB (ref 12.0–16.0)
LYMPHS ABS: 0.5 10*3/uL — AB (ref 1.0–3.6)
LYMPHS PCT: 7 %
MCH: 31.5 pg (ref 26.0–34.0)
MCHC: 34.3 g/dL (ref 32.0–36.0)
MCV: 91.9 fL (ref 80.0–100.0)
MONOS PCT: 12 %
Monocytes Absolute: 0.8 10*3/uL (ref 0.2–0.9)
NEUTROS ABS: 5.6 10*3/uL (ref 1.4–6.5)
NEUTROS PCT: 81 %
Platelets: 259 10*3/uL (ref 150–440)
RBC: 3.04 MIL/uL — AB (ref 3.80–5.20)
RDW: 21 % — ABNORMAL HIGH (ref 11.5–14.5)
WBC: 7 10*3/uL (ref 3.6–11.0)

## 2016-01-04 LAB — BASIC METABOLIC PANEL
Anion gap: 6 (ref 5–15)
BUN: 5 mg/dL — AB (ref 6–20)
CHLORIDE: 110 mmol/L (ref 101–111)
CO2: 24 mmol/L (ref 22–32)
CREATININE: 0.63 mg/dL (ref 0.44–1.00)
Calcium: 8.8 mg/dL — ABNORMAL LOW (ref 8.9–10.3)
GFR calc Af Amer: >60 mL/min (ref >60)
GFR calc non Af Amer: >60 mL/min (ref >60)
Glucose, Bld: 88 mg/dL (ref 65–99)
POTASSIUM: 3.5 mmol/L (ref 3.5–5.1)
SODIUM: 140 mmol/L (ref 135–145)

## 2016-01-04 LAB — MAGNESIUM: MAGNESIUM: 2 mg/dL (ref 1.7–2.4)

## 2016-01-04 LAB — PHOSPHORUS: PHOSPHORUS: 3.6 mg/dL (ref 2.5–4.6)

## 2016-01-04 MED ORDER — TRACE MINERALS CR-CU-MN-SE-ZN 10-1000-500-60 MCG/ML IV SOLN
INTRAVENOUS | Status: AC
  Administered 2016-01-04 – 2016-01-05 (×2): via INTRAVENOUS
  Filled 2016-01-04: qty 840

## 2016-01-04 MED ORDER — DIPHENHYDRAMINE HCL 50 MG/ML IJ SOLN
50.0000 mg | Freq: Once | INTRAMUSCULAR | Status: AC
  Administered 2016-01-04: 21:00:00 50 mg via INTRAVENOUS
  Filled 2016-01-04: qty 1

## 2016-01-04 NOTE — Progress Notes (Signed)
Misty Mack   DOB:1971/01/07   [id number]    Subjective: Patient had NG tube placed yesterday. She had about 2000+ through the NG tube yesterday [patient is using ice chips]. Otherwise abdominal distention improved.   Review of system: Passing gas. No fevers. No chills.  Objective:  Filed Vitals:   01/03/16 2052 01/04/16 0523  BP: 120/62 115/58  Pulse: 100 94  Temp: 97.4 F (36.3 C) 97.4 F (36.3 C)  Resp: 20 20     Intake/Output Summary (Last 24 hours) at 01/04/16 0815 Last data filed at 01/04/16 [id number]  Gross per 24 hour  Intake      0 ml  Output   2226 ml  Net  -2226 ml    GENERAL:alert, no distress and comfortable. Accompanied by her mother. SKIN: No petechial rash; other rash. EYES: No pallor OROPHARYNX: NG tube in place. NECK: supple, thyroid normal size, non-tender, without nodularity LUNGS: Decreased breath sounds bilaterally to bases. HEART: regular rate & rhythm and no murmurs and no lower extremity edema ABDOMEN:abdomen soft, non-tender; no hepatosplenomegaly. Musculoskeletal:no cyanosis of digits and no clubbing  NEURO: alert & oriented x 3 with fluent speech, no focal motor/sensory deficits   Labs:  Lab Results  Component Value Date   WBC 7.0 01/04/2016   HGB 9.6* 01/04/2016   HCT 27.9* 01/04/2016   MCV 91.9 01/04/2016   PLT 259 01/04/2016   NEUTROABS 5.6 01/04/2016    Lab Results  Component Value Date   NA 139 01/03/2016   K 3.2* 01/03/2016   CL 109 01/03/2016   CO2 25 01/03/2016    Studies:  Ct Abdomen Pelvis W Contrast  01/02/2016  CLINICAL DATA:  Metastatic colon carcinoma. Nausea and vomiting for 2 weeks. Currently undergoing chemotherapy and radiation therapy. EXAM: CT ABDOMEN AND PELVIS WITH CONTRAST TECHNIQUE: Multidetector CT imaging of the abdomen and pelvis was performed using the standard protocol following bolus administration of intravenous contrast. CONTRAST:  122mL OMNIPAQUE IOHEXOL 300 MG/ML  SOLN COMPARISON:  10/19/2015  FINDINGS: Lower chest:  No acute findings. Hepatobiliary: Several small right and left hepatic lobe cysts remain stable and no definite liver masses are identified. Prior cholecystectomy noted. No evidence of biliary dilatation. Pancreas: Poorly defined mass involving the pancreatic body in the posterior wall of the gastric body currently measures 3.8 x 4.2 cm on image 27/ series 2 compared to 2.8 x 5.3 cm previously. This mass causes chronic splenic vein thrombosis and prominent perigastric collateral vessels are seen predominantly in the gastrosplenic ligament. Spleen: Within normal limits in size and appearance. Adrenals/Urinary Tract: No masses identified. No evidence of hydronephrosis. Small left renal cyst remains stable. Stomach/Bowel: No evidence of obstruction, inflammatory process, or abnormal fluid collections. Postop changes again seen from previous right colectomy. Vascular/Lymphatic: New retroperitoneal lymphadenopathy is seen in the left paraaortic region measuring 1.4 cm on image 35 and 1.3 cm on image 45/series 2. No pelvic lymphadenopathy identified. No evidence of abdominal aortic aneurysm. Reproductive: No mass or other significant abnormality. Other: Small midline epigastric ventral hernia containing only fat is stable Musculoskeletal:  No suspicious bone lesions identified. IMPRESSION: Minimal decrease in size of ill-defined mass involving the pancreatic body and posterior wall of the gastric body compared to previous study. 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 New mild left paraaortic retroperitoneal lymphadenopathy, consistent with progression of metastatic disease. Electronically Signed   By: Earle Gell M.D.   On: 01/02/2016 18:21   Dg Abd 2 Views  01/02/2016  CLINICAL DATA:  Nausea, vomiting. EXAM: ABDOMEN - 2 VIEW COMPARISON:  12/31/2015 FINDINGS: Prior cholecystectomy. Nonobstructive bowel gas pattern. No free air  organomegaly. Visualized lung bases are clear. No acute bony abnormality. IMPRESSION: No evidence of bowel obstruction or free air. Electronically Signed   By: Rolm Baptise M.D.   On: 01/02/2016 14:10   Dg Abd Portable 1v  01/03/2016  CLINICAL DATA:  Evaluate NG tube placement. EXAM: PORTABLE ABDOMEN - 1 VIEW COMPARISON:  01/03/2016 at 1338 hours FINDINGS: NG tube has been reinserted. Tip now extends below the diaphragm into the stomach. Side hole lies at the level of the GE junction. IMPRESSION: NG tube tip now projects within the stomach. Electronically Signed   By: Lajean Manes M.D.   On: 01/03/2016 15:18   Dg Abd Portable 1v  01/03/2016  CLINICAL DATA:  Encounter for NT tube placement EXAM: PORTABLE ABDOMEN - 1 VIEW COMPARISON:  01/02/2016 FINDINGS: Nasogastric tube has been placed. Tip is coiled back upon itself in the distal esophagus, tip not included on the image. There is residual contrast in colonic loops. Partially imaged Port-A-Cath, tip overlying the level of the superior vena cava. IMPRESSION: Nasogastric tube coiled back upon itself in the lower esophagus, tip not included on this image. These results will be called to the ordering clinician or representative by the Radiologist Assistant, and communication documented in the PACS or zVision Dashboard. Electronically Signed   By: Nolon Nations M.D.   On: 01/03/2016 13:55    Assessment & Plan:   # Intractable nausea/ vomiting-  Gastric outlet obstruction vs other causes- s/p NG tube placement.   # Hypokalemia- patient normal saline with 20 of potassium. We will recheck labs in the morning tomorrow.   # Poor nutrition- spoken nutrition this morning/ plan to start TPN at a smaller rate tonight. Patient PICC for TPN. This has been ordered.   # Metastatic colon cancer- status post multiple prior lines of therapy. Await improvement of nausea vomiting.   Cammie Sickle, MD 01/04/2016  8:15 AM

## 2016-01-04 NOTE — Progress Notes (Signed)
Tolerating NG well. 2000+ output last 24 hours. ABD: Scaphoid. Will continue NG decompression over the weekend. Scheduled for SBFT on Monday. Would continue to limit diet to ice chips.

## 2016-01-04 NOTE — Progress Notes (Signed)
Nutrition Follow-up  DOCUMENTATION CODES:   Severe malnutrition in context of chronic illness  INTERVENTION:  PN: Consulted to start TPN. Recommend 5%AA/20% dextrose at goal rate of 66ml/hr. Will provide 90 g of protein, 1584 total kcals/d.  Recommend adding 20% lipids 2 times per week for additional 285 kcals/d but only after tolerating goal rate of TPN for at least 48 hr. Recommend starting TPN at 67ml/hr today and checking labs.  Pharmacy following for blood glucose management and electrolyte management.  Spoke with Dr. Rogue Bussing about above recommendations.  Pt at high risk of central line infection and recommend placing PICC line to have dedicated port/line for TPN (pt currently has single lumen port only). Discussed with Lattie Haw, Pharmacy and Adonis Brook.    NUTRITION DIAGNOSIS:   Malnutrition related to cancer and cancer related treatments, chronic illness as evidenced by energy intake < 75% for > or equal to 1 month, percent weight loss.    GOAL:   Patient will meet greater than or equal to 90% of their needs    MONITOR:   PO intake, Supplement acceptance, Labs, Weight trends, I & O's  REASON FOR ASSESSMENT:   Consult New TPN/TNA  ASSESSMENT:   Pt with NG tube placed for decompression possible neurogenic stomach vs partial bowel obstruction.  Noted SBFT to be done on Monday.     Current Nutrition: ice chips only   Gastrointestinal Profile: 2262ml output via emesis/NG Last BM: 3/9   Scheduled Medications:  . famotidine (PEPCID) IV  20 mg Intravenous Q24H  . LORazepam  0.5 mg Intravenous q morning - 10a  . metoCLOPramide (REGLAN) injection  10 mg Intravenous 4 times per day  . ondansetron (ZOFRAN) IV  4 mg Intravenous 4 times per day    Continuous Medications:  . Marland KitchenTPN (CLINIMIX-E) Adult    . 0.9 % NaCl with KCl 20 mEq / L 100 mL/hr at 01/04/16 1039     Electrolyte/Renal Profile and Glucose Profile:   Recent Labs Lab 12/31/15 0949  01/01/16 0606  01/02/16 0421 01/03/16 2132  NA 133*  --  137 141 139  K 2.3*  < > 3.3* 3.3* 3.2*  CL 96*  --  110 112* 109  CO2 25  --  22 25 25   BUN 6  --  5* <5* <5*  CREATININE 0.72  --  0.54 0.65 0.68  CALCIUM 9.5  --  8.3* 8.8* 9.0  MG 1.9  --   --   --   --   GLUCOSE 142*  --  117* 103* 97  < > = values in this interval not displayed. Protein Profile:  Recent Labs Lab 01/01/16 0606 01/02/16 0421 01/03/16 2132  ALBUMIN 2.9* 3.0* 3.3*      Weight Trend since Admission: Filed Weights   12/31/15 1555  Weight: 154 lb 6 oz (70.024 kg)      Diet Order:  .TPN (CLINIMIX-E) Adult  Skin:   reviewed   Height:   Ht Readings from Last 1 Encounters:  12/31/15 5\' 4"  (1.626 m)    Weight:   Wt Readings from Last 1 Encounters:  12/31/15 154 lb 6 oz (70.024 kg)    Ideal Body Weight:     BMI:  Body mass index is 26.49 kg/(m^2).  Estimated Nutritional Needs:   Kcal:  BEE: 1330kcals, TEE: (IF 1.1-1.3)(AF 1.2) 1755-2054kcals  Protein:  77-91g protein (1.1-1.3g/kg)  Fluid:  1750-2165mL of fluid (25-61mL/kg)  EDUCATION NEEDS:   No education needs identified at this time  HIGH Care Level  Tashaya Ancrum B. Zenia Resides, [place], [place] (pager) Weekend/On-Call pager [phone])

## 2016-01-04 NOTE — Progress Notes (Signed)
PARENTERAL NUTRITION CONSULT NOTE - INITIAL  Pharmacy Consult for TPN management - electrolytes and glucose Indication: Intractable N/V and SBO  Allergies  Allergen Reactions  . Fentanyl Swelling    Swelling of lips  . Oxaliplatin Itching  . Hydrocodone-Acetaminophen Hives    Patient Measurements: Height: 5\' 4"  (162.6 cm) Weight: 154 lb 6 oz (70.024 kg) IBW/kg (Calculated) : 54.7 Adjusted Body Weight: 60.8 kg  Vital Signs: Temp: 97.4 F (36.3 C) (03/10 0523) Temp Source: Oral (03/10 0523) BP: 115/48 mmHg (03/10 1234) Pulse Rate: 100 (03/10 1234) Intake/Output from previous day: 03/09 0701 - 03/10 0700 In: -  Out: 2226 [Emesis/NG output:2225; Stool:1] Intake/Output from this shift:    Labs:  Recent Labs  01/02/16 0421 01/04/16 0522  WBC 6.7 7.0  HGB 9.4* 9.6*  HCT 27.1* 27.9*  PLT 250 259     Recent Labs  01/02/16 0421 01/03/16 2132  NA 141 139  K 3.3* 3.2*  CL 112* 109  CO2 25 25  GLUCOSE 103* 97  BUN <5* <5*  CREATININE 0.65 0.68  CALCIUM 8.8* 9.0  PROT 6.0* 6.4*  ALBUMIN 3.0* 3.3*  AST 27 36  ALT 14 24  ALKPHOS 54 59  BILITOT 0.8 1.0   Estimated Creatinine Clearance: 85.2 mL/min (by C-G formula based on Cr of 0.68).   No results for input(s): GLUCAP in the last 72 hours.  Medical History: Past Medical History  Diagnosis Date  . Ulcer   . Anemia   . Obesity   . Constipation   . Migraines   . Cancer ([place]) 2015    colon  . Colon cancer metastasized to liver [facility]) April 2015    T3, N2, M1, stage IV with biopsy-proven liver metastases. On adjuvant chemotherapy    Medications:  Scheduled:  . famotidine (PEPCID) IV  20 mg Intravenous Q24H  . LORazepam  0.5 mg Intravenous q morning - 10a  . metoCLOPramide (REGLAN) injection  10 mg Intravenous 4 times per day  . ondansetron (ZOFRAN) IV  4 mg Intravenous 4 times per day   Infusions:  . Marland KitchenTPN (CLINIMIX-E) Adult    . 0.9 % NaCl with KCl 20 mEq / L 100 mL/hr at 01/04/16 1039    Insulin  Requirements in the past 24 hours:  Will order FSBS q6h to monitor glucose  Current Nutrition:  Clinimix E 5/20%, goal rate of 76ml/hr  Assessment: Patient to be started on TPN tonight, currently on NS w/20 KCl at 130ml/hr as well.  Plan:  Will follow up on electrolytes and blood sugars.  Paulina Fusi, PharmD, BCPS 01/04/2016 2:55 PM

## 2016-01-05 LAB — PHOSPHORUS: Phosphorus: 3 mg/dL (ref 2.5–4.6)

## 2016-01-05 LAB — COMPREHENSIVE METABOLIC PANEL
ALT: 21 U/L (ref 14–54)
AST: 22 U/L (ref 15–41)
Albumin: 3 g/dL — ABNORMAL LOW (ref 3.5–5.0)
Alkaline Phosphatase: 56 U/L (ref 38–126)
Anion gap: 6 (ref 5–15)
BUN: 7 mg/dL (ref 6–20)
CHLORIDE: 109 mmol/L (ref 101–111)
CO2: 25 mmol/L (ref 22–32)
CREATININE: 0.65 mg/dL (ref 0.44–1.00)
Calcium: 8.9 mg/dL (ref 8.9–10.3)
GFR calc Af Amer: >60 mL/min (ref >60)
GFR calc non Af Amer: >60 mL/min (ref >60)
Glucose, Bld: 140 mg/dL — ABNORMAL HIGH (ref 65–99)
Potassium: 3.5 mmol/L (ref 3.5–5.1)
SODIUM: 140 mmol/L (ref 135–145)
Total Bilirubin: 0.9 mg/dL (ref 0.3–1.2)
Total Protein: 6.3 g/dL — ABNORMAL LOW (ref 6.5–8.1)

## 2016-01-05 LAB — CBC WITH DIFFERENTIAL/PLATELET
Basophils Absolute: 0 10*3/uL (ref 0–0.1)
Basophils Relative: 0 %
EOS PCT: 0 %
Eosinophils Absolute: 0 10*3/uL (ref 0–0.7)
HCT: 28.3 % — ABNORMAL LOW (ref 35.0–47.0)
Hemoglobin: 9.6 g/dL — ABNORMAL LOW (ref 12.0–16.0)
LYMPHS ABS: 0.5 10*3/uL — AB (ref 1.0–3.6)
LYMPHS PCT: 4 %
MCH: 31.2 pg (ref 26.0–34.0)
MCHC: 34.1 g/dL (ref 32.0–36.0)
MCV: 91.7 fL (ref 80.0–100.0)
MONO ABS: 1.1 10*3/uL — AB (ref 0.2–0.9)
MONOS PCT: 10 %
Neutro Abs: 9.7 10*3/uL — ABNORMAL HIGH (ref 1.4–6.5)
Neutrophils Relative %: 86 %
PLATELETS: 279 10*3/uL (ref 150–440)
RBC: 3.08 MIL/uL — ABNORMAL LOW (ref 3.80–5.20)
RDW: 20.2 % — AB (ref 11.5–14.5)
WBC: 11.3 10*3/uL — ABNORMAL HIGH (ref 3.6–11.0)

## 2016-01-05 LAB — GLUCOSE, CAPILLARY
Glucose-Capillary: 122 mg/dL — ABNORMAL HIGH (ref 65–99)
Glucose-Capillary: 115 mg/dL — ABNORMAL HIGH (ref 65–99)
Glucose-Capillary: 163 mg/dL — ABNORMAL HIGH (ref 65–99)

## 2016-01-05 LAB — MAGNESIUM: Magnesium: 1.9 mg/dL (ref 1.7–2.4)

## 2016-01-05 LAB — TRIGLYCERIDES: Triglycerides: 99 mg/dL (ref <150)

## 2016-01-05 MED ORDER — INSULIN ASPART 100 UNIT/ML ~~LOC~~ SOLN
0.0000 [IU] | SUBCUTANEOUS | Status: DC
  Administered 2016-01-05: 1 [IU] via SUBCUTANEOUS
  Administered 2016-01-05 – 2016-01-06 (×2): 2 [IU] via SUBCUTANEOUS
  Administered 2016-01-06 (×4): 1 [IU] via SUBCUTANEOUS
  Administered 2016-01-07: 2 [IU] via SUBCUTANEOUS
  Administered 2016-01-07 (×2): 1 [IU] via SUBCUTANEOUS
  Administered 2016-01-07: 2 [IU] via SUBCUTANEOUS
  Administered 2016-01-07 – 2016-01-08 (×2): 1 [IU] via SUBCUTANEOUS
  Administered 2016-01-08: 2 [IU] via SUBCUTANEOUS
  Administered 2016-01-08: 09:00:00 1 [IU] via SUBCUTANEOUS
  Administered 2016-01-08: 22:00:00 3 [IU] via SUBCUTANEOUS
  Administered 2016-01-09 (×6): 2 [IU] via SUBCUTANEOUS
  Administered 2016-01-10: 1 [IU] via SUBCUTANEOUS
  Administered 2016-01-10: 2 [IU] via SUBCUTANEOUS
  Administered 2016-01-10 (×2): 1 [IU] via SUBCUTANEOUS
  Administered 2016-01-10: 13:00:00 2 [IU] via SUBCUTANEOUS
  Administered 2016-01-11 (×4): 1 [IU] via SUBCUTANEOUS
  Administered 2016-01-11: 09:00:00 2 [IU] via SUBCUTANEOUS
  Administered 2016-01-11: 1 [IU] via SUBCUTANEOUS
  Administered 2016-01-12: 17:00:00 3 [IU] via SUBCUTANEOUS
  Administered 2016-01-12: 08:00:00 2 [IU] via SUBCUTANEOUS
  Administered 2016-01-12 (×2): 1 [IU] via SUBCUTANEOUS
  Administered 2016-01-12 (×2): 2 [IU] via SUBCUTANEOUS
  Administered 2016-01-12: 1 [IU] via SUBCUTANEOUS
  Administered 2016-01-13: 2 [IU] via SUBCUTANEOUS
  Filled 2016-01-05: qty 3
  Filled 2016-01-05: qty 2
  Filled 2016-01-05: qty 3
  Filled 2016-01-05 (×2): qty 1
  Filled 2016-01-05 (×2): qty 2
  Filled 2016-01-05 (×2): qty 1
  Filled 2016-01-05 (×4): qty 2
  Filled 2016-01-05: qty 1
  Filled 2016-01-05: qty 2
  Filled 2016-01-05: qty 1
  Filled 2016-01-05 (×2): qty 2
  Filled 2016-01-05 (×3): qty 1
  Filled 2016-01-05: qty 2
  Filled 2016-01-05: qty 1
  Filled 2016-01-05: qty 2
  Filled 2016-01-05 (×3): qty 1
  Filled 2016-01-05 (×3): qty 2
  Filled 2016-01-05 (×2): qty 1
  Filled 2016-01-05: qty 2
  Filled 2016-01-05 (×2): qty 1
  Filled 2016-01-05: qty 3
  Filled 2016-01-05: qty 1
  Filled 2016-01-05: qty 2
  Filled 2016-01-05: qty 1
  Filled 2016-01-05: qty 2
  Filled 2016-01-05: qty 1
  Filled 2016-01-05: qty 2
  Filled 2016-01-05: qty 1

## 2016-01-05 MED ORDER — TRACE MINERALS CR-CU-MN-SE-ZN 10-1000-500-60 MCG/ML IV SOLN
INTRAVENOUS | Status: DC
  Administered 2016-01-05: 19:00:00 via INTRAVENOUS
  Filled 2016-01-05: qty 1800

## 2016-01-05 NOTE — Progress Notes (Addendum)
PARENTERAL NUTRITION CONSULT NOTE -follow up  Pharmacy Consult for TPN management - electrolytes and glucose Indication: Intractable N/V and SBO,  hx Metastatic colon cancer-  Allergies  Allergen Reactions  . Fentanyl Swelling    Swelling of lips  . Oxaliplatin Itching  . Hydrocodone-Acetaminophen Hives    Patient Measurements: Height: 5\' 4"  (162.6 cm) Weight: 145 lb 11.2 oz (66.089 kg) IBW/kg (Calculated) : 54.7 Adjusted Body Weight: 60.8 kg  Vital Signs: Temp: 97.9 F (36.6 C) (03/11 0604) Temp Source: Oral (03/11 0604) BP: 105/59 mmHg (03/11 0604) Pulse Rate: 103 (03/11 0604) Intake/Output from previous day: 03/10 0701 - 03/11 0700 In: 0  Out: 1650 [Emesis/NG output:1650] Intake/Output from this shift:    Labs:  Recent Labs  01/04/16 0522 01/05/16 0526  WBC 7.0 11.3*  HGB 9.6* 9.6*  HCT 27.9* 28.3*  PLT 259 279     Recent Labs  01/03/16 2132 01/04/16 0522 01/05/16 0526  NA 139 140 140  K 3.2* 3.5 3.5  CL 109 110 109  CO2 25 24 25   GLUCOSE 97 88 140*  BUN <5* 5* 7  CREATININE 0.68 0.63 0.65  CALCIUM 9.0 8.8* 8.9  MG  --  2.0 1.9  PHOS  --  3.6 3.0  PROT 6.4*  --  6.3*  ALBUMIN 3.3*  --  3.0*  AST 36  --  22  ALT 24  --  21  ALKPHOS 59  --  56  BILITOT 1.0  --  0.9  TRIG  --   --  99   Estimated Creatinine Clearance: 83.1 mL/min (by C-G formula based on Cr of 0.65).   No results for input(s): GLUCAP in the last 72 hours.  Medical History: Past Medical History  Diagnosis Date  . Ulcer   . Anemia   . Obesity   . Constipation   . Migraines   . Cancer ([place]) 2015    colon  . Colon cancer metastasized to liver [facility]) April 2015    T3, N2, M1, stage IV with biopsy-proven liver metastases. On adjuvant chemotherapy    Medications:  Scheduled:  . famotidine (PEPCID) IV  20 mg Intravenous Q24H  . LORazepam  0.5 mg Intravenous q morning - 10a  . metoCLOPramide (REGLAN) injection  10 mg Intravenous 4 times per day  . ondansetron (ZOFRAN)  IV  4 mg Intravenous 4 times per day   Infusions:  . Marland KitchenTPN (CLINIMIX-E) Adult 35 mL/hr at 01/04/16 1807  . 0.9 % NaCl with KCl 20 mEq / L 100 mL/hr at 01/05/16 0641    Insulin Requirements in the past 24 hours:  Will order SSI & follow. (note:No hx of DM) BG:140  Current Nutrition:  Clinimix E 5/20% at 35 ml/hr, goal rate of 44ml/hr  Assessment: Patient to be started on TPN tonight 3/10,  currently on NS w/20 KCl at 198ml/hr as well.  Plan:  3/11: Electrolytes WNL.  Will follow up on electrolytes and blood sugars.  Chinita Greenland PharmD Clinical Pharmacist 01/05/2016  9:03 AM

## 2016-01-05 NOTE — Progress Notes (Signed)
Misty Mack   DOB:27-Jan-1971   [id number]    Subjective: She had about 1650 through the NG tube yesterday [patient is using ice chips]. Otherwise abdominal distention improved. Difficulty sleeping at night. Needed dilaudid 1mg  x 5 overall yesterday.   Review of system: Passing gas. Last BM- 3/09.  No fevers. No chills.  Objective:  Filed Vitals:   01/04/16 2121 01/05/16 0604  BP: 104/60 105/59  Pulse: 109 103  Temp: 98.1 F (36.7 C) 97.9 F (36.6 C)  Resp:       Intake/Output Summary (Last 24 hours) at 01/05/16 [id number] Last data filed at 01/05/16 [id number]  Gross per 24 hour  Intake      0 ml  Output   1650 ml  Net  -1650 ml    GENERAL:alert, no distress and comfortable. She is alone.  SKIN: No petechial rash; other rash. EYES: No pallor OROPHARYNX: NG tube in place. NECK: supple,no lumps non-tender LUNGS: Decreased breath sounds bilaterally to bases. HEART: regular rate & rhythm and no murmurs and no lower extremity edema ABDOMEN:abdomen soft, non-tender; no hepatosplenomegaly. Musculoskeletal:no cyanosis of digits and no clubbing  NEURO: alert & oriented x 3 with fluent speech, no focal motor/sensory deficits   Labs:  Lab Results  Component Value Date   WBC 11.3* 01/05/2016   HGB 9.6* 01/05/2016   HCT 28.3* 01/05/2016   MCV 91.7 01/05/2016   PLT 279 01/05/2016   NEUTROABS 9.7* 01/05/2016    Lab Results  Component Value Date   NA 140 01/05/2016   K 3.5 01/05/2016   CL 109 01/05/2016   CO2 25 01/05/2016    Studies:  Dg Abd Portable 1v  01/03/2016  CLINICAL DATA:  Evaluate NG tube placement. EXAM: PORTABLE ABDOMEN - 1 VIEW COMPARISON:  01/03/2016 at 1338 hours FINDINGS: NG tube has been reinserted. Tip now extends below the diaphragm into the stomach. Side hole lies at the level of the GE junction. IMPRESSION: NG tube tip now projects within the stomach. Electronically Signed   By: Lajean Manes M.D.   On: 01/03/2016 15:18   Dg Abd Portable 1v  01/03/2016   CLINICAL DATA:  Encounter for NT tube placement EXAM: PORTABLE ABDOMEN - 1 VIEW COMPARISON:  01/02/2016 FINDINGS: Nasogastric tube has been placed. Tip is coiled back upon itself in the distal esophagus, tip not included on the image. There is residual contrast in colonic loops. Partially imaged Port-A-Cath, tip overlying the level of the superior vena cava. IMPRESSION: Nasogastric tube coiled back upon itself in the lower esophagus, tip not included on this image. These results will be called to the ordering clinician or representative by the Radiologist Assistant, and communication documented in the PACS or zVision Dashboard. Electronically Signed   By: Nolon Nations M.D.   On: 01/03/2016 13:55    Assessment & Plan:   # Intractable nausea/ vomiting-  Gastric outlet obstruction vs other causes- s/p NG tube placement [03/09]- NG output- 1650. Surgery following.   # Hypokalemia- 3.5. Improved.   # Poor nutrition- started TPN last night; tolerated well. Spoke to Nutrition this AM- will go up on the rate in pm; will cut down the rate of fluids in pm.   # Metastatic colon cancer- status post multiple prior lines of therapy. Treatments on hold sec to above acute issues.    Cammie Sickle, MD 01/05/2016  9:22 AM

## 2016-01-05 NOTE — Progress Notes (Addendum)
Nutrition Follow-up  DOCUMENTATION CODES:   Severe malnutrition in context of chronic illness  INTERVENTION:  PN: Spoke with Dr. Rogue Bussing this am and agreeable to increasing TPN to goal rate of 47ml/hr at 1800 to better meet nutritional goals. MD planning on adjusting IV fluids with K today. Reviewed importance of using dedicated port with RN, Theadora Rama. Spoke with Pharmacist, Erasmo Downer regarding insulin coverage for blood glucose as planning on increasing rate of TPN today.    NUTRITION DIAGNOSIS:   Malnutrition related to cancer and cancer related treatments, chronic illness as evidenced by energy intake < 75% for > or equal to 1 month, percent weight loss.    GOAL:   Patient will meet greater than or equal to 90% of their needs    MONITOR:   PO intake, Supplement acceptance, Labs, Weight trends, I & O's  REASON FOR ASSESSMENT:   Consult New TPN/TNA  ASSESSMENT:    Current Nutrition: TPN of 5%AA/20% dextrose currently infusing via Port per [employer], [place]. PICC not placed.   Gastrointestinal Profile:NG tube with 1619ml output last 24 hr, nausea noted Last BM: 3/9 Urine output: 68ml documented, spoke with pt and reports frequent urination, light pale in color. Order in place to document I and O.   Scheduled Medications:  . famotidine (PEPCID) IV  20 mg Intravenous Q24H  . insulin aspart  0-9 Units Subcutaneous 6 times per day  . LORazepam  0.5 mg Intravenous q morning - 10a  . metoCLOPramide (REGLAN) injection  10 mg Intravenous 4 times per day  . ondansetron (ZOFRAN) IV  4 mg Intravenous 4 times per day    Continuous Medications:  . Marland KitchenTPN (CLINIMIX-E) Adult 35 mL/hr at 01/04/16 1807  . 0.9 % NaCl with KCl 20 mEq / L 100 mL/hr at 01/05/16 0641     Electrolyte/Renal Profile and Glucose Profile:   Recent Labs Lab 12/31/15 0949  01/03/16 2132 01/04/16 0522 01/05/16 0526  NA 133*  < > 139 140 140  K 2.3*  < > 3.2* 3.5 3.5  CL 96*  < > 109 110 109  CO2 25  < > 25  24 25   BUN 6  < > <5* 5* 7  CREATININE 0.72  < > 0.68 0.63 0.65  CALCIUM 9.5  < > 9.0 8.8* 8.9  MG 1.9  --   --  2.0 1.9  PHOS  --   --   --  3.6 3.0  GLUCOSE 142*  < > 97 88 140*  < > = values in this interval not displayed. Protein Profile:  Recent Labs Lab 01/02/16 0421 01/03/16 2132 01/05/16 0526  ALBUMIN 3.0* 3.3* 3.0*      Weight Trend since Admission: Filed Weights   12/31/15 1555 01/05/16 0604  Weight: 154 lb 6 oz (70.024 kg) 145 lb 11.2 oz (66.089 kg)      Diet Order:  .TPN (CLINIMIX-E) Adult  Skin:   reviweed   Height:   Ht Readings from Last 1 Encounters:  12/31/15 5\' 4"  (1.626 m)    Weight:   Wt Readings from Last 1 Encounters:  01/05/16 145 lb 11.2 oz (66.089 kg)    Ideal Body Weight:     BMI:  Body mass index is 25 kg/(m^2).  Estimated Nutritional Needs:   Kcal:  BEE: 1330kcals, TEE: (IF 1.1-1.3)(AF 1.2) 1755-2054kcals  Protein:  77-91g protein (1.1-1.3g/kg)  Fluid:  1750-2180mL of fluid (25-94mL/kg)  EDUCATION NEEDS:   No education needs identified at this time  HIGH Care  Level  Khamryn Calderone B. Zenia Resides, [place], [place] (pager) Weekend/On-Call pager [phone])

## 2016-01-06 DIAGNOSIS — R109 Unspecified abdominal pain: Secondary | ICD-10-CM

## 2016-01-06 LAB — BASIC METABOLIC PANEL
Anion gap: 7 (ref 5–15)
BUN: 9 mg/dL (ref 6–20)
CALCIUM: 8.9 mg/dL (ref 8.9–10.3)
CO2: 26 mmol/L (ref 22–32)
CREATININE: 0.58 mg/dL (ref 0.44–1.00)
Chloride: 108 mmol/L (ref 101–111)
GFR calc Af Amer: >60 mL/min (ref >60)
GLUCOSE: 148 mg/dL — AB (ref 65–99)
POTASSIUM: 3.4 mmol/L — AB (ref 3.5–5.1)
Sodium: 141 mmol/L (ref 135–145)

## 2016-01-06 LAB — CBC WITH DIFFERENTIAL/PLATELET
Basophils Absolute: 0 10*3/uL (ref 0–0.1)
Basophils Relative: 0 %
Eosinophils Absolute: 0.1 10*3/uL (ref 0–0.7)
Eosinophils Relative: 1 %
HEMATOCRIT: 26.5 % — AB (ref 35.0–47.0)
Hemoglobin: 9.1 g/dL — ABNORMAL LOW (ref 12.0–16.0)
LYMPHS PCT: 5 %
Lymphs Abs: 0.5 10*3/uL — ABNORMAL LOW (ref 1.0–3.6)
MCH: 31.5 pg (ref 26.0–34.0)
MCHC: 34.2 g/dL (ref 32.0–36.0)
MCV: 91.9 fL (ref 80.0–100.0)
MONO ABS: 1.2 10*3/uL — AB (ref 0.2–0.9)
MONOS PCT: 11 %
NEUTROS ABS: 9.3 10*3/uL — AB (ref 1.4–6.5)
Neutrophils Relative %: 83 %
Platelets: 260 10*3/uL (ref 150–440)
RBC: 2.88 MIL/uL — ABNORMAL LOW (ref 3.80–5.20)
RDW: 19.9 % — AB (ref 11.5–14.5)
WBC: 11.1 10*3/uL — ABNORMAL HIGH (ref 3.6–11.0)

## 2016-01-06 LAB — GLUCOSE, CAPILLARY
GLUCOSE-CAPILLARY: 129 mg/dL — AB (ref 65–99)
GLUCOSE-CAPILLARY: 132 mg/dL — AB (ref 65–99)
GLUCOSE-CAPILLARY: 111 mg/dL — AB (ref 65–99)
GLUCOSE-CAPILLARY: 134 mg/dL — AB (ref 65–99)
Glucose-Capillary: 136 mg/dL — ABNORMAL HIGH (ref 65–99)
Glucose-Capillary: 144 mg/dL — ABNORMAL HIGH (ref 65–99)
Glucose-Capillary: 160 mg/dL — ABNORMAL HIGH (ref 65–99)

## 2016-01-06 LAB — PHOSPHORUS: Phosphorus: 3.8 mg/dL (ref 2.5–4.6)

## 2016-01-06 LAB — MAGNESIUM: MAGNESIUM: 2 mg/dL (ref 1.7–2.4)

## 2016-01-06 LAB — CEA: CEA: 5.6 ng/mL — ABNORMAL HIGH (ref 0.0–4.7)

## 2016-01-06 MED ORDER — HYDROMORPHONE HCL 1 MG/ML IJ SOLN
1.0000 mg | INTRAMUSCULAR | Status: DC | PRN
  Administered 2016-01-07 – 2016-01-08 (×8): 1 mg via INTRAVENOUS
  Filled 2016-01-06 (×8): qty 1

## 2016-01-06 MED ORDER — TRACE MINERALS CR-CU-MN-SE-ZN 10-1000-500-60 MCG/ML IV SOLN
INTRAVENOUS | Status: AC
  Administered 2016-01-06: 17:00:00 via INTRAVENOUS
  Filled 2016-01-06: qty 1800

## 2016-01-06 MED ORDER — POTASSIUM CHLORIDE 10 MEQ/100ML IV SOLN
10.0000 meq | INTRAVENOUS | Status: AC
  Administered 2016-01-06 (×3): 10 meq via INTRAVENOUS
  Filled 2016-01-06 (×3): qty 100

## 2016-01-06 NOTE — Progress Notes (Signed)
Nutrition Follow-up  DOCUMENTATION CODES:   Severe malnutrition in context of chronic illness  INTERVENTION:  PN: Continue TPN of 5%AA/20% dextrose at goal rate of 60ml/hr to meet nutritional needs. Pharmacy following electrolytes (noted supplementing K) and blood glucose   NUTRITION DIAGNOSIS:   Malnutrition related to cancer and cancer related treatments, chronic illness as evidenced by energy intake < 75% for > or equal to 1 month, percent weight loss.    GOAL:   Patient will meet greater than or equal to 90% of their needs    MONITOR:   PO intake, Supplement acceptance, Labs, Weight trends, I & O's  REASON FOR ASSESSMENT:   Consult New TPN/TNA  ASSESSMENT:    Current Nutrition: Pt tolerating TPN via port a cath at 52ml/hr at this time. NPO except ice chips   Gastrointestinal Profile: NG tube with 1736ml output noted Last BM: 3/9   Scheduled Medications:  . famotidine (PEPCID) IV  20 mg Intravenous Q24H  . insulin aspart  0-9 Units Subcutaneous 6 times per day  . LORazepam  0.5 mg Intravenous q morning - 10a  . metoCLOPramide (REGLAN) injection  10 mg Intravenous 4 times per day  . ondansetron (ZOFRAN) IV  4 mg Intravenous 4 times per day  . potassium chloride  10 mEq Intravenous Q1 Hr x 3    Continuous Medications:  . Marland KitchenTPN (CLINIMIX-E) Adult 75 mL/hr at 01/05/16 1834  . 0.9 % NaCl with KCl 20 mEq / L 30 mL/hr at 01/05/16 1737     Electrolyte/Renal Profile and Glucose Profile:   Recent Labs Lab 01/04/16 0522 01/05/16 0526 01/06/16 0416  NA 140 140 141  K 3.5 3.5 3.4*  CL 110 109 108  CO2 24 25 26   BUN 5* 7 9  CREATININE 0.63 0.65 0.58  CALCIUM 8.8* 8.9 8.9  MG 2.0 1.9 2.0  PHOS 3.6 3.0 3.8  GLUCOSE 88 140* 148*   Protein Profile:  Recent Labs Lab 01/02/16 0421 01/03/16 2132 01/05/16 0526  ALBUMIN 3.0* 3.3* 3.0*       Weight Trend since Admission: Filed Weights   12/31/15 1555 01/05/16 0604 01/06/16 0455  Weight: 154 lb 6 oz  (70.024 kg) 145 lb 11.2 oz (66.089 kg) 150 lb (68.04 kg)      Diet Order:  .TPN (CLINIMIX-E) Adult    Height:   Ht Readings from Last 1 Encounters:  12/31/15 5\' 4"  (1.626 m)    Weight:   Wt Readings from Last 1 Encounters:  01/06/16 150 lb (68.04 kg)    Ideal Body Weight:     BMI:  Body mass index is 25.73 kg/(m^2).  Estimated Nutritional Needs:   Kcal:  BEE: 1330kcals, TEE: (IF 1.1-1.3)(AF 1.2) 1755-2054kcals  Protein:  77-91g protein (1.1-1.3g/kg)  Fluid:  1750-2151mL of fluid (25-58mL/kg)  EDUCATION NEEDS:   No education needs identified at this time  HIGH Care Level  Kilah Drahos B. Zenia Resides, [place], [place] (pager) Weekend/On-Call pager [phone])

## 2016-01-06 NOTE — Progress Notes (Signed)
fsbs 145 per NT ayo

## 2016-01-06 NOTE — Progress Notes (Signed)
Per Dr. Patrecia Pace dilaudid can be increased from q6h to q4h IV dilaudid prn for pain. Verbal order.

## 2016-01-06 NOTE — Progress Notes (Signed)
PARENTERAL NUTRITION CONSULT NOTE -follow up  Pharmacy Consult for TPN management - electrolytes and glucose Indication: Intractable N/V and SBO,  hx Metastatic colon cancer-  Allergies  Allergen Reactions  . Fentanyl Swelling    Swelling of lips  . Oxaliplatin Itching  . Hydrocodone-Acetaminophen Hives    Patient Measurements: Height: 5\' 4"  (162.6 cm) Weight: 150 lb (68.04 kg) IBW/kg (Calculated) : 54.7 Adjusted Body Weight: 60.8 kg  Vital Signs: Temp: 97.6 F (36.4 C) (03/12 0455) Temp Source: Oral (03/12 0455) BP: 101/56 mmHg (03/12 0455) Pulse Rate: 116 (03/12 0455) Intake/Output from previous day: 03/11 0701 - 03/12 0700 In: 2744 [I.V.:1360; IV Piggyback:50; [id number] Out: [id number] [Emesis/NG output:1725] Intake/Output from this shift:    Labs:  Recent Labs  01/04/16 0522 01/05/16 0526 01/06/16 0416  WBC 7.0 11.3* 11.1*  HGB 9.6* 9.6* 9.1*  HCT 27.9* 28.3* 26.5*  PLT 259 279 260     Recent Labs  01/03/16 2132 01/04/16 0522 01/05/16 0526 01/06/16 0416  NA 139 140 140 141  K 3.2* 3.5 3.5 3.4*  CL 109 110 109 108  CO2 25 24 25 26   GLUCOSE 97 88 140* 148*  BUN <5* 5* 7 9  CREATININE 0.68 0.63 0.65 0.58  CALCIUM 9.0 8.8* 8.9 8.9  MG  --  2.0 1.9 2.0  PHOS  --  3.6 3.0 3.8  PROT 6.4*  --  6.3*  --   ALBUMIN 3.3*  --  3.0*  --   AST 36  --  22  --   ALT 24  --  21  --   ALKPHOS 59  --  56  --   BILITOT 1.0  --  0.9  --   TRIG  --   --  99  --    Estimated Creatinine Clearance: 84.1 mL/min (by C-G formula based on Cr of 0.58).    Recent Labs  01/05/16 1954 01/06/16 0049 01/06/16 0451  GLUCAP 163* 144* 129*    Medical History: Past Medical History  Diagnosis Date  . Ulcer   . Anemia   . Obesity   . Constipation   . Migraines   . Cancer ([place]) 2015    colon  . Colon cancer metastasized to liver [facility]) April 2015    T3, N2, M1, stage IV with biopsy-proven liver metastases. On adjuvant chemotherapy    Medications:  Scheduled:  .  famotidine (PEPCID) IV  20 mg Intravenous Q24H  . insulin aspart  0-9 Units Subcutaneous 6 times per day  . LORazepam  0.5 mg Intravenous q morning - 10a  . metoCLOPramide (REGLAN) injection  10 mg Intravenous 4 times per day  . ondansetron (ZOFRAN) IV  4 mg Intravenous 4 times per day  . potassium chloride  10 mEq Intravenous Q1 Hr x 3   Infusions:  . Marland KitchenTPN (CLINIMIX-E) Adult 75 mL/hr at 01/05/16 1834  . 0.9 % NaCl with KCl 20 mEq / L 30 mL/hr at 01/05/16 1737    Insulin Requirements in the past 24 hours:  Will order SSI & follow. (note:No hx of DM) BG: 122, [phone] SSI/24hr:  5 units  Current Nutrition:  Clinimix E 5/20% at goal rate of 43ml/hr now NPO with NG tube, ice chips  Assessment: Patient to be started on TPN tonight 3/10,  currently on NS w/20 KCl at 133ml/hr as well. (Rate decreased to 30 ml/hr on 3/11)  Plan:  3/11: Electrolytes K= 3.4, others WNL. Will give KCL run of 30 meq.  Will follow up on electrolytes and blood sugars.  Chinita Greenland PharmD Clinical Pharmacist 01/06/2016  7:23 AM

## 2016-01-06 NOTE — Progress Notes (Signed)
AHLAYAH QUAID   DOB:Jun 19, 1971   [id number]    Subjective: She had about 1725 through the NG tube yesterday [patient is using ice chips]. Continues to have intermittent abdominal pain; only mild improvement of the abdominal distention noted after NG tube placement. Needed dilaudid 1mg  x 6 overall yesterday.   Review of system: Passing gas. Last BM- 3/09.  No fevers. No chills.  Objective:  Filed Vitals:   01/05/16 1959 01/06/16 0455  BP:  101/56  Pulse: 115 116  Temp:  97.6 F (36.4 C)  Resp:  16     Intake/Output Summary (Last 24 hours) at 01/06/16 1037 Last data filed at 01/06/16 0955  Gross per 24 hour  Intake   2844 ml  Output   1725 ml  Net   1119 ml    GENERAL:alert, no distress and comfortable. She is accompanied by her mother. NG tube in place. SKIN: No petechial rash; other rash. EYES: No pallor OROPHARYNX: NG tube in place. NECK: supple,no lumps non-tender LUNGS: Decreased breath sounds bilaterally to bases. HEART: regular rate & rhythm and no murmurs and no lower extremity edema ABDOMEN:abdomen soft, non-tender; no hepatosplenomegaly. Musculoskeletal:no cyanosis of digits and no clubbing  NEURO: alert & oriented x 3 with fluent speech, no focal motor/sensory deficits   Labs:  Lab Results  Component Value Date   WBC 11.1* 01/06/2016   HGB 9.1* 01/06/2016   HCT 26.5* 01/06/2016   MCV 91.9 01/06/2016   PLT 260 01/06/2016   NEUTROABS 9.3* 01/06/2016    Lab Results  Component Value Date   NA 141 01/06/2016   K 3.4* 01/06/2016   CL 108 01/06/2016   CO2 26 01/06/2016    Studies:  No results found.  Assessment & Plan:   # Intractable nausea/ vomiting-  Gastric outlet obstruction vs other causes- s/p NG tube placement [03/09]- NG output- 1725. No significant improvement in the output noted in the last 3 days. Awaiting abdominal x-rays tomorrow morning Surgery following.   # Hypokalemia- 3.4; patient receiving potassium; per pharmacy.   # Poor  nutrition- started TPN last night; tolerated well.   # Metastatic colon cancer- status post multiple prior lines of therapy. Treatments on hold sec to above acute issues.    Cammie Sickle, MD 01/06/2016  10:37 AM

## 2016-01-07 ENCOUNTER — Inpatient Hospital Stay: Payer: 59

## 2016-01-07 ENCOUNTER — Ambulatory Visit: Payer: 59

## 2016-01-07 DIAGNOSIS — R1900 Intra-abdominal and pelvic swelling, mass and lump, unspecified site: Secondary | ICD-10-CM

## 2016-01-07 DIAGNOSIS — Z9889 Other specified postprocedural states: Secondary | ICD-10-CM

## 2016-01-07 DIAGNOSIS — Z931 Gastrostomy status: Secondary | ICD-10-CM

## 2016-01-07 DIAGNOSIS — K5669 Other intestinal obstruction: Secondary | ICD-10-CM

## 2016-01-07 LAB — BASIC METABOLIC PANEL
Anion gap: 9 (ref 5–15)
BUN: 13 mg/dL (ref 6–20)
CHLORIDE: 102 mmol/L (ref 101–111)
CO2: 28 mmol/L (ref 22–32)
CREATININE: 0.52 mg/dL (ref 0.44–1.00)
Calcium: 9.4 mg/dL (ref 8.9–10.3)
GFR calc Af Amer: >60 mL/min (ref >60)
GFR calc non Af Amer: >60 mL/min (ref >60)
Glucose, Bld: 144 mg/dL — ABNORMAL HIGH (ref 65–99)
POTASSIUM: 3.9 mmol/L (ref 3.5–5.1)
SODIUM: 139 mmol/L (ref 135–145)

## 2016-01-07 LAB — CBC WITH DIFFERENTIAL/PLATELET
BASOS ABS: 0 10*3/uL (ref 0–0.1)
BASOS PCT: 0 %
Eosinophils Absolute: 0.1 10*3/uL (ref 0–0.7)
Eosinophils Relative: 1 %
HEMATOCRIT: 29.2 % — AB (ref 35.0–47.0)
HEMOGLOBIN: 9.8 g/dL — AB (ref 12.0–16.0)
LYMPHS PCT: 6 %
Lymphs Abs: 0.7 10*3/uL — ABNORMAL LOW (ref 1.0–3.6)
MCH: 31.2 pg (ref 26.0–34.0)
MCHC: 33.5 g/dL (ref 32.0–36.0)
MCV: 93 fL (ref 80.0–100.0)
MONO ABS: 1.7 10*3/uL — AB (ref 0.2–0.9)
MONOS PCT: 13 %
NEUTROS ABS: 10.3 10*3/uL — AB (ref 1.4–6.5)
NEUTROS PCT: 80 %
Platelets: 295 10*3/uL (ref 150–440)
RBC: 3.14 MIL/uL — ABNORMAL LOW (ref 3.80–5.20)
RDW: 19.8 % — AB (ref 11.5–14.5)
WBC: 12.8 10*3/uL — ABNORMAL HIGH (ref 3.6–11.0)

## 2016-01-07 LAB — PHOSPHORUS: PHOSPHORUS: 4.3 mg/dL (ref 2.5–4.6)

## 2016-01-07 LAB — MAGNESIUM: MAGNESIUM: 2.3 mg/dL (ref 1.7–2.4)

## 2016-01-07 LAB — GLUCOSE, CAPILLARY
GLUCOSE-CAPILLARY: 149 mg/dL — AB (ref 65–99)
GLUCOSE-CAPILLARY: 153 mg/dL — AB (ref 65–99)
GLUCOSE-CAPILLARY: 167 mg/dL — AB (ref 65–99)
GLUCOSE-CAPILLARY: 184 mg/dL — AB (ref 65–99)
Glucose-Capillary: 134 mg/dL — ABNORMAL HIGH (ref 65–99)
Glucose-Capillary: 163 mg/dL — ABNORMAL HIGH (ref 65–99)

## 2016-01-07 MED ORDER — TRACE MINERALS CR-CU-MN-SE-ZN 10-1000-500-60 MCG/ML IV SOLN
INTRAVENOUS | Status: DC
  Administered 2016-01-07: 18:00:00 via INTRAVENOUS
  Filled 2016-01-07: qty 1800

## 2016-01-07 MED ORDER — CEFAZOLIN SODIUM-DEXTROSE 2-3 GM-% IV SOLR
2.0000 g | Freq: Once | INTRAVENOUS | Status: AC
  Administered 2016-01-08: 2 g via INTRAVENOUS
  Filled 2016-01-07: qty 50

## 2016-01-07 NOTE — Progress Notes (Addendum)
Upper GI/SBFT of this morning reviewed with the radiologist. Study confirms suspicions arising from previous CT of obstruction in the proximal portion of the jejunum. There appears to be tethering of the fourth portion of the duodenum/proximal jejunum just past the SMA to the pancreatic tumor mass. On the upper GI series there is a suggestion of significant impairment of gastric emptying from the mass protruding into the back wall. This was not evident on last month's upper endoscopy.  The patient will benefit from bypass of the stomach to the proximal jejunum beyond the area of obstruction.  Plans for surgical procedure were reviewed with the patient, her mother and her aunt this afternoon.  We'll obtain a plain film this evening to see if the barium has been cleared from the stomach, and if so the NG tube will be removed to minimize irritation prior to the procedure.   In speaking with Dr. Oliva Bustard, he raised the possibility of biopsying of pancreatic mass. Biopsy of this area to time of her original colon resection was negative, and we'll determine assess stability at the time of the surgical procedure.  The patient's cardiopulmonary exam today is clear. The abdomen is soft and nontender, with resolution of the massively distended stomach previously noted prior to NG tube passage.

## 2016-01-07 NOTE — Progress Notes (Signed)
ngt flushed  q 1 hr x 3 with 200 ml  Of solution and pulled back. Return of creamy  Yellow sol.  Small amt  Barium returned. Cannister emptied earlier and had large amoumt of barium  That had floated to bottom of cannister.Odette Horns in to do  abd xray at 0800. Prn x3 for c/o abd pain with relief

## 2016-01-07 NOTE — Progress Notes (Signed)
Patient was admitted in hospital so please see initial history and physical and admission note  [place] @ [facility] Telephone:([phone]) [phone]  Fax:([phone]) [place] OB: 1971-06-18  MR#: [id number]  YTK#:[id number]  Patient Care Team: Lynnell Jude, MD as PCP - General (Family Medicine) [name], MD (Oncology) Seeplaputhur [name], MD (General Surgery)  CHIEF COMPLAINT:  No chief complaint on file.  Oncology History   1. Carcinoma of the colon, splenic  flexure.   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 2. K-RAS mutated. 3. Patient started on FOLFOX and Avastin 4 th June, 2015 4. Allergic reaction  characterized by generalized itching and redness  of face and palm on June 15, 2014 5. Started on FOLFIRI  and avastin   July 13, 2014 6. Finished 12 cycles of chemotherapy, 5-FU based FOLFOX was changed to FOLFIRI BECAUSE OF SENSITIVITY TO OXALIPLATIN(September 07, 2014) 7. Started on maintenance chemotherapy with 5-FU leucovorin and Avastin from September 28, 2014 8.  Progressive disease based on tumor markers as well as PET scan's so patient was started on FOLFOX chemotherapy with desensitizing  protocol.   9.  Progressing disease by CT scan and by symptoms.  Patient will be started on TAS-102 from October, 2016 10.  Progressing disease on  TAS-102 (December, 2016) 11.patient has been started on palliative radiation therapy to retroperitoneal mass and 5-FU by continuous infusion (December 03, 2015 12.  Patient started in 5-FU by continuous infusion and radiation therapy (February, 2017)      INTERVAL HISTORY: \  45 year old lady with progressing colon cancer.  Losing weight.  Appetite is poor. Dilaudid is helping with the pain without any significant side effect. Patient had an upper GI series with small bowel follow O's partial obstruction at the gastric  outlet junction.  Patient has a mass infiltrating posterior part of the stomach and the first part of duodenum. Patient continues to have significant amount of NG tube output.       REVIEW OF SYSTEMS:   GENERAL:  Feels good.  Active.  No fevers, sweats or weight loss. PERFORMANCE STATUS (ECOG): 0 HEENT:  No visual changes, runny nose, sore throat, mouth sores or tenderness.  Mild headache Visual disturbances as described about Lungs: No shortness of breath or cough.  No hemoptysis. Cardiac:  No chest pain, palpitations, orthopnea, or PND. GI: Nausea following chemotherapy lasting for more than 24 hours which is now resolved.  No diarrhea. GU:  No urgency, frequency, dysuria, or hematuria. Musculoskeletal:  Back pain as described above Extremities:  No pain or swelling.  Pain in the left hip after fall.took motrn Skin:  No rashes or skin changes. Neuro:  No headache, numbness or weakness, balance or coordination issues. Endocrine:  No diabetes, thyroid issues, hot flashes or night sweats. Psych:  No mood changes, depression or anxiety. Pain:  No focal pain. Review of systems:  All other systems reviewed and found to be negative.  As per HPI. Otherwise, a complete review of systems is negatve.  PAST MEDICAL HISTORY: Past Medical History  Diagnosis Date  . Ulcer   . Anemia   . Obesity   . Constipation   . Migraines   . Cancer ([place]) 2015    colon  . Colon cancer metastasized to liver [facility]) April 2015    T3, N2, M1, stage IV with biopsy-proven liver  metastases. On adjuvant chemotherapy    PAST SURGICAL HISTORY: Past Surgical History  Procedure Laterality Date  . Cholecystectomy  2014  . Tubal ligation  2005  . Colonoscopy  2015  . Upper gi endoscopy  2015  . Esophagogastroduodenoscopy (egd) with propofol N/A 11/19/2015    Procedure: ESOPHAGOGASTRODUODENOSCOPY (EGD) WITH PROPOFOL;  Surgeon: Kieth Brightly, MD;  Location: [facility] ENDOSCOPY;  Service: Endoscopy;   Laterality: N/A;    FAMILY HISTORY No significant family history of malignancy       ADVANCED DIRECTIVES:   Patient does have advanced healthcare directive HEALTH MAINTENANCE: Social History  Substance Use Topics  . Smoking status: Never Smoker   . Smokeless tobacco: None  . Alcohol Use: No      Allergies  Allergen Reactions  . Fentanyl Swelling    Swelling of lips  . Oxaliplatin Itching  . Hydrocodone-Acetaminophen Hives    Current Facility-Administered Medications  Medication Dose Route Frequency Provider Last Rate Last Dose  . Marland KitchenTPN (CLINIMIX-E) Adult   Intravenous Continuous TPN Earna Coder, MD 75 mL/hr at 01/06/16 1727    . Marland KitchenTPN (CLINIMIX-E) Adult   Intravenous Continuous TPN Earna Coder, MD      . 0.9 % NaCl with KCl 20 mEq/ L  infusion   Intravenous Continuous Earna Coder, MD 30 mL/hr at 01/05/16 1737    . acetaminophen (TYLENOL) tablet 650 mg  650 mg Oral Q8H PRN Earna Coder, MD   650 mg at 01/02/16 1907  . famotidine (PEPCID) IVPB 20 mg premix  20 mg Intravenous Q24H Johney Maine, MD   20 mg at 01/07/16 1131  . HYDROmorphone (DILAUDID) injection 1 mg  1 mg Intravenous Q4H PRN Earna Coder, MD   1 mg at 01/07/16 1114  . insulin aspart (novoLOG) injection 0-9 Units  0-9 Units Subcutaneous 6 times per day Earna Coder, MD   2 Units at 01/07/16 1223  . LORazepam (ATIVAN) injection 0.5 mg  0.5 mg Intravenous q morning - 10a Johney Maine, MD   0.5 mg at 01/07/16 1115  . metoCLOPramide (REGLAN) injection 10 mg  10 mg Intravenous 4 times per day Johney Maine, MD   10 mg at 01/07/16 1114  . ondansetron (ZOFRAN) injection 4 mg  4 mg Intravenous 4 times per day Johney Maine, MD   4 mg at 01/07/16 1114  . ondansetron (ZOFRAN) injection 4 mg  4 mg Intravenous Q4H PRN Johney Maine, MD   4 mg at 01/04/16 1627  . phenol (CHLORASEPTIC) mouth spray 1 spray  1 spray Mouth/Throat Q2H PRN Johney Maine, MD   1 spray at 01/05/16 6052    Facility-Administered Medications Ordered in Other Encounters  Medication Dose Route Frequency Provider Last Rate Last Dose  . sodium chloride 0.9 % injection 10 mL  10 mL Intracatheter PRN Johney Maine, MD   10 mL at 03/21/15 1025  . sodium chloride 0.9 % injection 10 mL  10 mL Intravenous PRN Johney Maine, MD   10 mL at 04/21/15 1025  . sodium chloride 0.9 % injection 10 mL  10 mL Intracatheter PRN Johney Maine, MD   10 mL at 12/03/15 1105    OBJECTIVE:  Filed Vitals:   01/07/16 0404 01/07/16 1144  BP: 110/56 100/49  Pulse: 79 114  Temp: 97.6 F (36.4 C) 98 F (36.7 C)  Resp: 18      Body mass index is 26.25 kg/(m^2).    ECOG FS:0 - Asymptomatic  PHYSICAL EXAM:  GENERAL:  Well developed, well nourished, sitting comfortably in the exam room in no acute distress. MENTAL STATUS:  Alert and oriented to person, place and time. HEAD:  .  Normocephalic, atraumatic, face symmetric, no Cushingoid features. EYES:   Pupils equal round and reactive to light and accomodation.  No conjunctivitis or scleral icterus. ENT:  Oropharynx clear without lesion.  Tongue normal. Mucous membranes moist.  RESPIRATORY:  Clear to auscultation without rales, wheezes or rhonchi. CARDIOVASCULAR:  Regular rate and rhythm without murmur, rub or gallop. BREAST:  Right breast without masses, skin changes or nipple discharge.  Left breast without masses, skin changes or nipple discharge. ABDOMEN:  Soft, non-tender, with active bowel sounds, and no hepatosplenomegaly.  No masses. BACK:  No CVA tenderness.  No tenderness on percussion of the back or rib cage. SKIN:  No rashes, ulcers or lesions. EXTREMITIES: No edema, no skin discoloration or tenderness.  No palpable cords. LYMPH NODES: No palpable cervical, supraclavicular, axillary or inguinal adenopathy  NEUROLOGICAL: Unremarkable. PSYCH:  Appropriate.     Admission on 12/31/2015  No results displayed because visit has over 200 results.      No results  found for: LABCA2 Lab Results  Component Value Date   CA199 <1 02/03/2014   Lab Results  Component Value Date   CEA 5.6* 01/05/2016           ASSESSMENT:  1.  Carcinoma of colon stage IV disease progressing disease based on CT scan.    Upper GI series with small bowel follow up shows partial obstruction of the gastric outlet junction.  I would discuss situation with Dr. Tollie Pizza regarding possibility of  bypass surgery to improve quality of life and pursue further treatment for her malignancy.  Overall prognosis is poor and that has been discussed with the patient's mother I discussed that situation with Dr.B my associate and he will follow this patient over the weekend     Clinical: Stage IVB (yT3, N1, M1b) - Signed by [name], MD on 03/07/2015   [name], MD   01/07/2016 1:44 PM  [place] @ [facility]

## 2016-01-07 NOTE — Progress Notes (Signed)
OR time opened for tomorrow.   Will proceed with exploration and bypass. Patient informed of date change. This evenings KUB shows the stomach cleared of barium/ Will d/c NG for now so patient can rest overnight. Plans for bypass and gastrostomy tube reviewed with patient earlier today, with plans for OR on Wednesday, March 15th. She is fine with proceeding one day early.  Labs reviewed. OK for tomorrow OR.

## 2016-01-07 NOTE — Progress Notes (Signed)
Nutrition Follow-up  DOCUMENTATION CODES:   Severe malnutrition in context of chronic illness  INTERVENTION:   PN: Continue current PN regimen. Will likely recommend starting Lipids tomorrow. Will continue to follow and make recommendations accordingly.  Pharmacy following   NUTRITION DIAGNOSIS:   Malnutrition related to cancer and cancer related treatments, chronic illness as evidenced by energy intake < 75% for > or equal to 1 month, percent weight loss.  GOAL:   Patient will meet greater than or equal to 90% of their needs  MONITOR:   PO intake, Supplement acceptance, Labs, Weight trends, I & O's  REASON FOR ASSESSMENT:   Consult New TPN/TNA  ASSESSMENT:   Pt admitted with intractible n/v with hypokalemia. Pt with h/o colon cancer on palliative chemoradiation.  Per MD note, pt with GOO, possible surgical intervention as MD noted discussion with surgeon, Dr. Bary Castilla. Pt remains with NG Tube to low intermittent suction.    Diet Order:  .TPN (CLINIMIX-E) Adult .TPN (CLINIMIX-E) Adult     Current Nutrition: tolerating 5%AA/20%Dextrose at 57mL/hr   Gastrointestinal Profile: NGT-LIS 2156mL out past 24 hours per I/O documentation Last BM: 01/03/2016   Scheduled Medications:  . famotidine (PEPCID) IV  20 mg Intravenous Q24H  . insulin aspart  0-9 Units Subcutaneous 6 times per day  . LORazepam  0.5 mg Intravenous q morning - 10a  . metoCLOPramide (REGLAN) injection  10 mg Intravenous 4 times per day  . ondansetron (ZOFRAN) IV  4 mg Intravenous 4 times per day    Continuous Medications:  . Marland KitchenTPN (CLINIMIX-E) Adult 75 mL/hr at 01/06/16 1727  . Marland KitchenTPN (CLINIMIX-E) Adult    . 0.9 % NaCl with KCl 20 mEq / L 30 mL/hr at 01/07/16 1346     Electrolyte/Renal Profile and Glucose Profile:   Recent Labs Lab 01/05/16 0526 01/06/16 0416 01/07/16 0336  NA 140 141 139  K 3.5 3.4* 3.9  CL 109 108 102  CO2 25 26 28   BUN 7 9 13   CREATININE 0.65 0.58 0.52  CALCIUM 8.9 8.9  9.4  MG 1.9 2.0 2.3  PHOS 3.0 3.8 4.3  GLUCOSE 140* 148* 144*   Protein Profile:  Recent Labs Lab 01/02/16 0421 01/03/16 2132 01/05/16 0526  ALBUMIN 3.0* 3.3* 3.0*     Weight Trend since Admission: Filed Weights   01/05/16 0604 01/06/16 0455 01/07/16 0404  Weight: 145 lb 11.2 oz (66.089 kg) 150 lb (68.04 kg) 153 lb (69.4 kg)     BMI:  Body mass index is 26.25 kg/(m^2).  Estimated Nutritional Needs:   Kcal:  BEE: 1330kcals, TEE: (IF 1.1-1.3)(AF 1.2) 1755-2054kcals  Protein:  77-91g protein (1.1-1.3g/kg)  Fluid:  1750-2155mL of fluid (25-59mL/kg)  EDUCATION NEEDS:   No education needs identified at this time   [place], RD, LDN Pager [phone] Weekend/On-Call Pager [phone]

## 2016-01-07 NOTE — Care Management (Signed)
Admitted to [facility] with the diagnosis of colon cancer. Lives alone. Mother is Josabet Mcphail [phone] or [phone]). Sees Dr. Jeb Levering on a daily basis. Last seen Dr. Clemmie Krill yesterday. Last seen Dr. Jamal Collin in February. Takes care of all basic and instrumental basic needs herself prior to this episode. Drives to her job at [employer] On FMLA now. Decreased appetite. Fell before Christmas. Uses no aids for ambulation. No home Health. No skilled facility. No home oxygen. Radiation treatments in progress. N/G tube in place. Shelbie Ammons RN MSN CCM Care Management [phone]

## 2016-01-07 NOTE — Progress Notes (Signed)
PARENTERAL NUTRITION CONSULT NOTE -follow up  Pharmacy Consult for TPN management - electrolytes and glucose Indication: Intractable N/V and SBO,  hx Metastatic colon cancer-  Allergies  Allergen Reactions  . Fentanyl Swelling    Swelling of lips  . Oxaliplatin Itching  . Hydrocodone-Acetaminophen Hives    Patient Measurements: Height: 5\' 4"  (162.6 cm) Weight: 153 lb (69.4 kg) IBW/kg (Calculated) : 54.7 Adjusted Body Weight: 60.8 kg  Vital Signs: Temp: 98 F (36.7 C) (03/13 1144) Temp Source: Oral (03/13 1144) BP: 100/49 mmHg (03/13 1144) Pulse Rate: 114 (03/13 1144) Intake/Output from previous day: 03/12 0701 - 03/13 0700 In: 2692.5 [I.V.:499.5; IV Piggyback:350; [id number] Out: 2150 [Emesis/NG output:2150] Intake/Output from this shift:    Labs:  Recent Labs  01/05/16 0526 01/06/16 0416 01/07/16 0336  WBC 11.3* 11.1* 12.8*  HGB 9.6* 9.1* 9.8*  HCT 28.3* 26.5* 29.2*  PLT 279 260 295     Recent Labs  01/05/16 0526 01/06/16 0416 01/07/16 0336  NA 140 141 139  K 3.5 3.4* 3.9  CL 109 108 102  CO2 25 26 28   GLUCOSE 140* 148* 144*  BUN 7 9 13   CREATININE 0.65 0.58 0.52  CALCIUM 8.9 8.9 9.4  MG 1.9 2.0 2.3  PHOS 3.0 3.8 4.3  PROT 6.3*  --   --   ALBUMIN 3.0*  --   --   AST 22  --   --   ALT 21  --   --   ALKPHOS 56  --   --   BILITOT 0.9  --   --   TRIG 99  --   --    Estimated Creatinine Clearance: 85 mL/min (by C-G formula based on Cr of 0.52).    Recent Labs  01/07/16 0402 01/07/16 0705 01/07/16 1142  GLUCAP 134* 167* 184*    Medical History: Past Medical History  Diagnosis Date  . Ulcer   . Anemia   . Obesity   . Constipation   . Migraines   . Cancer ([place]) 2015    colon  . Colon cancer metastasized to liver [facility]) April 2015    T3, N2, M1, stage IV with biopsy-proven liver metastases. On adjuvant chemotherapy    Medications:  Scheduled:  . famotidine (PEPCID) IV  20 mg Intravenous Q24H  . insulin aspart  0-9 Units  Subcutaneous 6 times per day  . LORazepam  0.5 mg Intravenous q morning - 10a  . metoCLOPramide (REGLAN) injection  10 mg Intravenous 4 times per day  . ondansetron (ZOFRAN) IV  4 mg Intravenous 4 times per day   Infusions:  . Marland KitchenTPN (CLINIMIX-E) Adult 75 mL/hr at 01/06/16 1727  . Marland KitchenTPN (CLINIMIX-E) Adult    . 0.9 % NaCl with KCl 20 mEq / L 30 mL/hr at 01/07/16 1346    Insulin Requirements in the past 24 hours:  SSI/24hr:  7 units  Current Nutrition:  Clinimix E 5/20% at goal rate of 89ml/hr now NPO with NG tube, ice chips  Assessment: Patient to be started on TPN tonight 3/10,  currently on NS w/20 KCl at 180ml/hr as well. (Rate decreased to 30 ml/hr on 3/11)  Plan:  3/13 Electrolytes within normal limits. Will follow electrolytes and FSBS.  Paulina Fusi, PharmD, BCPS 01/07/2016 3:41 PM

## 2016-01-08 ENCOUNTER — Encounter
Admission: AD | Disposition: A | Discharge status: Home / Self Care | Payer: Self-pay | Source: Ambulatory Visit | Attending: General Surgery

## 2016-01-08 ENCOUNTER — Encounter: Payer: Self-pay | Admitting: *Deleted

## 2016-01-08 ENCOUNTER — Inpatient Hospital Stay: Payer: 59 | Admitting: Anesthesiology

## 2016-01-08 ENCOUNTER — Encounter: Payer: Self-pay | Admitting: Internal Medicine

## 2016-01-08 ENCOUNTER — Ambulatory Visit: Payer: 59

## 2016-01-08 DIAGNOSIS — C189 Malignant neoplasm of colon, unspecified: Secondary | ICD-10-CM

## 2016-01-08 DIAGNOSIS — K56609 Unspecified intestinal obstruction, unspecified as to partial versus complete obstruction: Secondary | ICD-10-CM | POA: Diagnosis present

## 2016-01-08 DIAGNOSIS — K5660 Unspecified intestinal obstruction: Secondary | ICD-10-CM

## 2016-01-08 DIAGNOSIS — C7889 Secondary malignant neoplasm of other digestive organs: Secondary | ICD-10-CM

## 2016-01-08 HISTORY — PX: LAPAROTOMY: SHX154

## 2016-01-08 LAB — CBC WITH DIFFERENTIAL/PLATELET
BASOS ABS: 0 10*3/uL (ref 0–0.1)
Basophils Relative: 0 %
EOS ABS: 0.1 10*3/uL (ref 0–0.7)
EOS PCT: 1 %
HCT: 28.1 % — ABNORMAL LOW (ref 35.0–47.0)
Hemoglobin: 9.8 g/dL — ABNORMAL LOW (ref 12.0–16.0)
LYMPHS ABS: 0.6 10*3/uL — AB (ref 1.0–3.6)
LYMPHS PCT: 5 %
MCH: 31.6 pg (ref 26.0–34.0)
MCHC: 34.8 g/dL (ref 32.0–36.0)
MCV: 90.8 fL (ref 80.0–100.0)
MONO ABS: 1.6 10*3/uL — AB (ref 0.2–0.9)
Monocytes Relative: 13 %
NEUTROS ABS: 10.3 10*3/uL — AB (ref 1.4–6.5)
NEUTROS PCT: 81 %
PLATELETS: 289 10*3/uL (ref 150–440)
RBC: 3.1 MIL/uL — ABNORMAL LOW (ref 3.80–5.20)
RDW: 19.5 % — AB (ref 11.5–14.5)
WBC: 12.5 10*3/uL — AB (ref 3.6–11.0)

## 2016-01-08 LAB — BASIC METABOLIC PANEL
Anion gap: 7 (ref 5–15)
BUN: 15 mg/dL (ref 6–20)
CALCIUM: 9.2 mg/dL (ref 8.9–10.3)
CO2: 29 mmol/L (ref 22–32)
Chloride: 100 mmol/L — ABNORMAL LOW (ref 101–111)
Creatinine, Ser: 0.51 mg/dL (ref 0.44–1.00)
GFR calc Af Amer: >60 mL/min (ref >60)
Glucose, Bld: 146 mg/dL — ABNORMAL HIGH (ref 65–99)
Potassium: 3.7 mmol/L (ref 3.5–5.1)
SODIUM: 136 mmol/L (ref 135–145)

## 2016-01-08 LAB — PHOSPHORUS: PHOSPHORUS: 4.3 mg/dL (ref 2.5–4.6)

## 2016-01-08 LAB — SURGICAL PCR SCREEN
MRSA, PCR: NEGATIVE
Staphylococcus aureus: POSITIVE — AB

## 2016-01-08 LAB — GLUCOSE, CAPILLARY
GLUCOSE-CAPILLARY: 230 mg/dL — AB (ref 65–99)
Glucose-Capillary: 161 mg/dL — ABNORMAL HIGH (ref 65–99)
Glucose-Capillary: 136 mg/dL — ABNORMAL HIGH (ref 65–99)
Glucose-Capillary: 102 mg/dL — ABNORMAL HIGH (ref 65–99)

## 2016-01-08 LAB — MAGNESIUM: Magnesium: 2.1 mg/dL (ref 1.7–2.4)

## 2016-01-08 LAB — PREGNANCY, URINE: Preg Test, Ur: NEGATIVE

## 2016-01-08 SURGERY — LAPAROTOMY, EXPLORATORY
Anesthesia: General | Wound class: Clean Contaminated

## 2016-01-08 MED ORDER — DEXAMETHASONE SODIUM PHOSPHATE 10 MG/ML IJ SOLN
INTRAMUSCULAR | Status: DC | PRN
  Administered 2016-01-08: 8 mg via INTRAVENOUS

## 2016-01-08 MED ORDER — SUCCINYLCHOLINE CHLORIDE 20 MG/ML IJ SOLN
INTRAMUSCULAR | Status: DC | PRN
  Administered 2016-01-08: 100 mg via INTRAVENOUS

## 2016-01-08 MED ORDER — ACETAMINOPHEN 10 MG/ML IV SOLN
1000.0000 mg | Freq: Four times a day (QID) | INTRAVENOUS | Status: AC
  Administered 2016-01-08 – 2016-01-09 (×4): 1000 mg via INTRAVENOUS
  Filled 2016-01-08 (×4): qty 100

## 2016-01-08 MED ORDER — ESMOLOL HCL 100 MG/10ML IV SOLN
INTRAVENOUS | Status: DC | PRN
  Administered 2016-01-08: 10 mg via INTRAVENOUS

## 2016-01-08 MED ORDER — SODIUM CHLORIDE 0.9 % IV SOLN
INTRAVENOUS | Status: DC | PRN
  Administered 2016-01-08: 12:00:00 via INTRAVENOUS

## 2016-01-08 MED ORDER — PHENYLEPHRINE HCL 10 MG/ML IJ SOLN
INTRAMUSCULAR | Status: DC | PRN
  Administered 2016-01-08 (×2): 100 ug via INTRAVENOUS

## 2016-01-08 MED ORDER — DEXMEDETOMIDINE HCL 200 MCG/2ML IV SOLN
INTRAVENOUS | Status: DC | PRN
  Administered 2016-01-08 (×2): 8 ug via INTRAVENOUS

## 2016-01-08 MED ORDER — ACETAMINOPHEN 10 MG/ML IV SOLN
INTRAVENOUS | Status: DC | PRN
  Administered 2016-01-08: 1000 mg via INTRAVENOUS

## 2016-01-08 MED ORDER — HYDROMORPHONE HCL 1 MG/ML IJ SOLN
INTRAMUSCULAR | Status: AC
  Administered 2016-01-08: 20:00:00
  Filled 2016-01-08: qty 1

## 2016-01-08 MED ORDER — HYDROMORPHONE HCL 1 MG/ML IJ SOLN
0.2500 mg | INTRAMUSCULAR | Status: DC | PRN
  Administered 2016-01-08 (×2): 0.5 mg via INTRAVENOUS

## 2016-01-08 MED ORDER — ONDANSETRON HCL 4 MG/2ML IJ SOLN: 4.0000 mg | Freq: Once | INTRAMUSCULAR | Status: DC | PRN

## 2016-01-08 MED ORDER — MORPHINE SULFATE (PF) 0.5 MG/ML IJ SOLN
INTRAMUSCULAR | Status: DC | PRN
  Administered 2016-01-08 (×2): 1 mg via EPIDURAL
  Administered 2016-01-08 (×2): .5 mg via EPIDURAL

## 2016-01-08 MED ORDER — LACTATED RINGERS IV SOLN
INTRAVENOUS | Status: DC | PRN
  Administered 2016-01-08 (×2): via INTRAVENOUS

## 2016-01-08 MED ORDER — MORPHINE SULFATE (PF) 0.5 MG/ML IJ SOLN
INTRAMUSCULAR | Status: AC
  Filled 2016-01-08: qty 10

## 2016-01-08 MED ORDER — CHLORHEXIDINE GLUCONATE CLOTH 2 % EX PADS
6.0000 | MEDICATED_PAD | Freq: Every day | CUTANEOUS | Status: DC
  Administered 2016-01-08 – 2016-01-09 (×2): 6 via TOPICAL

## 2016-01-08 MED ORDER — MIDAZOLAM HCL 5 MG/5ML IJ SOLN
INTRAMUSCULAR | Status: DC | PRN
  Administered 2016-01-08: 2 mg via INTRAVENOUS

## 2016-01-08 MED ORDER — LACTATED RINGERS IV SOLN
INTRAVENOUS | Status: DC
  Administered 2016-01-08 – 2016-01-11 (×4): via INTRAVENOUS

## 2016-01-08 MED ORDER — ACETAMINOPHEN 10 MG/ML IV SOLN
INTRAVENOUS | Status: AC
  Filled 2016-01-08: qty 100

## 2016-01-08 MED ORDER — M.V.I. ADULT IV INJ
INJECTION | INTRAVENOUS | Status: AC
  Administered 2016-01-08: 18:00:00 via INTRAVENOUS
  Filled 2016-01-08: qty 1800

## 2016-01-08 MED ORDER — LIDOCAINE HCL (CARDIAC) 20 MG/ML IV SOLN
INTRAVENOUS | Status: DC | PRN
  Administered 2016-01-08: 80 mg via INTRAVENOUS

## 2016-01-08 MED ORDER — MUPIROCIN 2 % EX OINT
1.0000 "application " | TOPICAL_OINTMENT | Freq: Two times a day (BID) | CUTANEOUS | Status: AC
  Administered 2016-01-08 – 2016-01-12 (×10): 1 via NASAL
  Filled 2016-01-08: qty 22

## 2016-01-08 MED ORDER — SUGAMMADEX SODIUM 200 MG/2ML IV SOLN
INTRAVENOUS | Status: DC | PRN
  Administered 2016-01-08: 135.2 mg via INTRAVENOUS

## 2016-01-08 MED ORDER — PROPOFOL 10 MG/ML IV BOLUS
INTRAVENOUS | Status: DC | PRN
  Administered 2016-01-08: 150 mg via INTRAVENOUS

## 2016-01-08 MED ORDER — ROCURONIUM BROMIDE 100 MG/10ML IV SOLN
INTRAVENOUS | Status: DC | PRN
  Administered 2016-01-08: 20 mg via INTRAVENOUS
  Administered 2016-01-08: 10 mg via INTRAVENOUS
  Administered 2016-01-08: 20 mg via INTRAVENOUS

## 2016-01-08 MED ORDER — ONDANSETRON HCL 4 MG/2ML IJ SOLN
INTRAMUSCULAR | Status: DC | PRN
  Administered 2016-01-08: 4 mg via INTRAVENOUS

## 2016-01-08 MED ORDER — SODIUM CHLORIDE 0.9 % IV SOLN
10000.0000 ug | INTRAVENOUS | Status: DC | PRN
  Administered 2016-01-08: 25 ug/min via INTRAVENOUS

## 2016-01-08 MED ORDER — FENTANYL CITRATE (PF) 100 MCG/2ML IJ SOLN: 25.0000 ug | INTRAMUSCULAR | Status: DC | PRN

## 2016-01-08 MED ORDER — HYDROMORPHONE HCL 1 MG/ML IJ SOLN
1.0000 mg | INTRAMUSCULAR | Status: DC | PRN
  Administered 2016-01-08 – 2016-01-15 (×45): 1 mg via INTRAVENOUS
  Filled 2016-01-08 (×46): qty 1

## 2016-01-08 MED ORDER — CEFAZOLIN SODIUM-DEXTROSE 2-3 GM-% IV SOLR
INTRAVENOUS | Status: AC
  Administered 2016-01-08: 2 g via INTRAVENOUS
  Filled 2016-01-08: qty 50

## 2016-01-08 MED ORDER — SODIUM CHLORIDE 0.9 % IV SOLN: INTRAVENOUS | Status: DC | PRN

## 2016-01-08 MED ORDER — MORPHINE SULFATE (PF) 10 MG/ML IV SOLN
INTRAVENOUS | Status: AC
  Filled 2016-01-08: qty 1

## 2016-01-08 SURGICAL SUPPLY — 38 items
BULB RESERV EVAC DRAIN JP 100C (MISCELLANEOUS) ×1 IMPLANT
CANISTER SUCT 1200ML W/VALVE (MISCELLANEOUS) ×3 IMPLANT
CATH TRAY 16F METER LATEX (MISCELLANEOUS) ×3 IMPLANT
CHLORAPREP W/TINT 26ML (MISCELLANEOUS) ×3 IMPLANT
COVER CLAMP SIL LG PBX B (MISCELLANEOUS) ×1 IMPLANT
DRAIN CHANNEL JP 15F RND 16 (MISCELLANEOUS) ×1 IMPLANT
DRAIN CONNECTOR BLAKE 1:1 (MISCELLANEOUS) ×2 IMPLANT
DRAPE LAPAROTOMY 100X77 ABD (DRAPES) ×3 IMPLANT
DRSG OPSITE POSTOP 4X8 (GAUZE/BANDAGES/DRESSINGS) ×2 IMPLANT
DRSG TELFA 3X8 NADH (GAUZE/BANDAGES/DRESSINGS) ×6 IMPLANT
ELECT BLADE 6 FLAT ULTRCLN (ELECTRODE) ×3 IMPLANT
ELECT REM PT RETURN 9FT ADLT (ELECTROSURGICAL) ×3
ELECTRODE REM PT RTRN 9FT ADLT (ELECTROSURGICAL) ×1 IMPLANT
GAUZE SPONGE 4X4 12PLY STRL (GAUZE/BANDAGES/DRESSINGS) ×3 IMPLANT
GLOVE BIO SURGEON STRL SZ7.5 (GLOVE) ×3 IMPLANT
GLOVE INDICATOR 8.0 STRL GRN (GLOVE) ×3 IMPLANT
GOWN STRL REUS W/ TWL LRG LVL3 (GOWN DISPOSABLE) ×2 IMPLANT
GOWN STRL REUS W/TWL LRG LVL3 (GOWN DISPOSABLE) ×6
HOLDER FOLEY CATH W/STRAP (MISCELLANEOUS) ×3 IMPLANT
KIT RM TURNOVER STRD PROC AR (KITS) ×3 IMPLANT
LABEL OR SOLS (LABEL) ×3 IMPLANT
NS IRRIG 1000ML POUR BTL (IV SOLUTION) ×3 IMPLANT
PACK BASIN MAJOR ARMC (MISCELLANEOUS) ×3 IMPLANT
PAD DRESSING TELFA 3X8 NADH (GAUZE/BANDAGES/DRESSINGS) ×1 IMPLANT
SET YANKAUER POOLE SUCT (MISCELLANEOUS) ×3 IMPLANT
SPONGE LAP 18X18 5 PK (GAUZE/BANDAGES/DRESSINGS) ×3 IMPLANT
STAPLER SKIN PROX 35W (STAPLE) ×3 IMPLANT
SUT ETH BLK MONO 3 0 FS 1 12/B (SUTURE) ×9 IMPLANT
SUT ETHILON 3-0 FS-10 30 BLK (SUTURE) ×3
SUT PROLENE 0 CT 1 30 (SUTURE) ×14 IMPLANT
SUT SILK 3-0 (SUTURE) ×3 IMPLANT
SUT VIC AB 2-0 BRD 54 (SUTURE) ×3 IMPLANT
SUT VIC AB 2-0 CT1 (SUTURE) ×6 IMPLANT
SUT VIC AB 3-0 SH 27 (SUTURE) ×6
SUT VIC AB 3-0 SH 27X BRD (SUTURE) ×2 IMPLANT
SUT VICRYL 3-0 SH-1 18IN (SUTURE) ×3 IMPLANT
SUTURE EHLN 3-0 FS-10 30 BLK (SUTURE) IMPLANT
TUBE JEJUNO 18X14 (TUBING) ×2 IMPLANT

## 2016-01-08 NOTE — Anesthesia Procedure Notes (Signed)
Procedure Name: Intubation Date/Time: 01/08/2016 12:35 PM Performed by: Delaney Meigs Pre-anesthesia Checklist: Patient identified, Emergency Drugs available, Suction available, Patient being monitored and Timeout performed Patient Re-evaluated:Patient Re-evaluated prior to inductionOxygen Delivery Method: Circle system utilized Preoxygenation: Pre-oxygenation with 100% oxygen Intubation Type: IV induction Ventilation: Mask ventilation without difficulty Laryngoscope Size: Mac and 3 Tube type: Oral Tube size: 7.0 mm Number of attempts: 1 Airway Equipment and Method: Stylet Placement Confirmation: ETT inserted through vocal cords under direct vision,  positive ETCO2 and breath sounds checked- equal and bilateral Secured at: 19 cm Tube secured with: Tape Dental Injury: Teeth and Oropharynx as per pre-operative assessment

## 2016-01-08 NOTE — Anesthesia Postprocedure Evaluation (Signed)
Anesthesia Post Note  Patient: Misty Mack  Procedure(s) Performed: Procedure(s) (LRB): EXPLORATORY LAPAROTOMY, GASTRIC Jejunostomy, gastrostomy, upper endoscopy (N/A)  Patient location during evaluation: PACU Anesthesia Type: General Level of consciousness: awake and alert Pain management: pain level controlled Vital Signs Assessment: post-procedure vital signs reviewed and stable Respiratory status: spontaneous breathing, nonlabored ventilation, respiratory function stable and patient connected to nasal cannula oxygen Cardiovascular status: blood pressure returned to baseline and stable Postop Assessment: no signs of nausea or vomiting Anesthetic complications: no    Last Vitals:  Filed Vitals:   01/08/16 1126 01/08/16 1512  BP: 92/48 92/31  Pulse: 110 90  Temp: 36.8 C 37.2 C  Resp: 16 6    Last Pain:  Filed Vitals:   01/08/16 1518  PainSc: 5                  Martha Clan

## 2016-01-08 NOTE — Plan of Care (Signed)
Problem: Nutritional: Goal: Maintenance of adequate nutrition will improve Outcome: Not Progressing Cont npo x sips and ice chips.  Exp lap today. g tube placed.  Dressing at  Mid abd d/i.  Prn for pain. Foley post op. tpn cont. And ivfs cont lr.

## 2016-01-08 NOTE — Anesthesia Preprocedure Evaluation (Signed)
Anesthesia Evaluation  Patient identified by MRN, date of birth, ID band Patient awake    Reviewed: Allergy & Precautions, NPO status , Patient's Chart, lab work & pertinent test results  Airway Mallampati: II       Dental  (+) Teeth Intact   Pulmonary neg pulmonary ROS,    breath sounds clear to auscultation       Cardiovascular Exercise Tolerance: Poor  Rhythm:Regular     Neuro/Psych    GI/Hepatic negative GI ROS, Liver mets   Endo/Other  negative endocrine ROS  Renal/GU negative Renal ROS     Musculoskeletal   Abdominal Normal abdominal exam  (+)   Peds  Hematology  (+) anemia ,   Anesthesia Other Findings   Reproductive/Obstetrics                             Anesthesia Physical Anesthesia Plan  ASA: III  Anesthesia Plan: General   Post-op Pain Management:    Induction: Intravenous  Airway Management Planned: Oral ETT  Additional Equipment:   Intra-op Plan:   Post-operative Plan: Extubation in OR  Informed Consent: I have reviewed the patients History and Physical, chart, labs and discussed the procedure including the risks, benefits and alternatives for the proposed anesthesia with the patient or authorized representative who has indicated his/her understanding and acceptance.     Plan Discussed with: CRNA  Anesthesia Plan Comments:         Anesthesia Quick Evaluation

## 2016-01-08 NOTE — Transfer of Care (Signed)
Immediate Anesthesia Transfer of Care Note  Patient: Misty Mack  Procedure(s) Performed: Procedure(s): EXPLORATORY LAPAROTOMY, GASTRIC Jejunostomy, gastrostomy, upper endoscopy (N/A)  Patient Location: PACU  Anesthesia Type:General  Level of Consciousness: sedated  Airway & Oxygen Therapy: Patient Spontanous Breathing and Patient connected to face mask oxygen  Post-op Assessment: Report given to RN and Post -op Vital signs reviewed and stable  Post vital signs: Reviewed and stable  Last Vitals:  Filed Vitals:   01/08/16 1126 01/08/16 1512  BP: 92/48 92/31  Pulse: 110 90  Temp: 36.8 C 37.2 C  Resp: 16 6    Complications: No apparent anesthesia complications

## 2016-01-08 NOTE — Progress Notes (Signed)
Lance for upper endoscopy at the time of today's surgery reviewed with the patient by phone. This will help determine if a high or low bypass point on the stomach is chosen. We have previously reviewed the role for a gastrostomy tube to obviate the need for NG drainage post procedure. She is amenable.

## 2016-01-08 NOTE — Progress Notes (Signed)
Patient was admitted in hospital so please see initial history and physical and admission note  [place] @ [facility] Telephone:([phone]) [phone]  Fax:(336) [place] OB: 1971-07-06  MR#: [id number]  RXV#:[id number]  Patient Care Team: Lynnell Jude, MD as PCP - General (Family Medicine) [name], MD (Oncology) Seeplaputhur [name], MD (General Surgery)  CHIEF COMPLAINT:  No chief complaint on file.  Oncology History   1. Carcinoma of the colon, splenic  flexure.   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 2. K-RAS mutated. 3. Patient started on FOLFOX and Avastin 4 th June, 2015 4. Allergic reaction  characterized by generalized itching and redness  of face and palm on June 15, 2014 5. Started on FOLFIRI  and avastin   July 13, 2014 6. Finished 12 cycles of chemotherapy, 5-FU based FOLFOX was changed to FOLFIRI BECAUSE OF SENSITIVITY TO OXALIPLATIN(September 07, 2014) 7. Started on maintenance chemotherapy with 5-FU leucovorin and Avastin from September 28, 2014 8.  Progressive disease based on tumor markers as well as PET scan's so patient was started on FOLFOX chemotherapy with desensitizing  protocol.   9.  Progressing disease by CT scan and by symptoms.  Patient will be started on TAS-102 from October, 2016 10.  Progressing disease on  TAS-102 (December, 2016) 11.patient has been started on palliative radiation therapy to retroperitoneal mass and 5-FU by continuous infusion (December 03, 2015 12.  Patient started in 5-FU by continuous infusion and radiation therapy (February, 2017)      INTERVAL HISTORY: \  45 year old lady with progressing colon cancer.  Losing weight.  Appetite is poor. Dilaudid is helping with the pain without any significant side effect. Patient had an upper GI series with small bowel follow O's partial obstruction at the gastric  outlet junction.  Patient has a mass infiltrating posterior part of the stomach and the first part of duodenum. Patient continues to have significant amount of NG tube output. All lab data has been reviewed       REVIEW OF SYSTEMS:   GENERAL:  Feels good.  Active.  No fevers, sweats or weight loss. PERFORMANCE STATUS (ECOG): 0 HEENT:  No visual changes, runny nose, sore throat, mouth sores or tenderness.  Mild headache Visual disturbances as described about Lungs: No shortness of breath or cough.  No hemoptysis. Cardiac:  No chest pain, palpitations, orthopnea, or PND. GI: Nausea following chemotherapy lasting for more than 24 hours which is now resolved.  No diarrhea. GU:  No urgency, frequency, dysuria, or hematuria. Musculoskeletal:  Back pain as described above Extremities:  No pain or swelling.  Pain in the left hip after fall.took motrn Skin:  No rashes or skin changes. Neuro:  No headache, numbness or weakness, balance or coordination issues. Endocrine:  No diabetes, thyroid issues, hot flashes or night sweats. Psych:  No mood changes, depression or anxiety. Pain:  No focal pain. Review of systems:  All other systems reviewed and found to be negative.  As per HPI. Otherwise, a complete review of systems is negatve.  PAST MEDICAL HISTORY: Past Medical History  Diagnosis Date  . Ulcer   . Anemia   . Obesity   . Constipation   . Migraines   . Cancer ([place]) 2015    colon  . Colon cancer metastasized to liver [facility]) April 2015    T3, N2,  M1, stage IV with biopsy-proven liver metastases. On adjuvant chemotherapy    PAST SURGICAL HISTORY: Past Surgical History  Procedure Laterality Date  . Cholecystectomy  2014  . Tubal ligation  2005  . Colonoscopy  2015  . Upper gi endoscopy  2015  . Esophagogastroduodenoscopy (egd) with propofol N/A 11/19/2015    Procedure: ESOPHAGOGASTRODUODENOSCOPY (EGD) WITH PROPOFOL;  Surgeon: Kieth Brightly, MD;  Location: [facility] ENDOSCOPY;   Service: Endoscopy;  Laterality: N/A;    FAMILY HISTORY No significant family history of malignancy       ADVANCED DIRECTIVES:   Patient does have advanced healthcare directive HEALTH MAINTENANCE: Social History  Substance Use Topics  . Smoking status: Never Smoker   . Smokeless tobacco: None  . Alcohol Use: No      Allergies  Allergen Reactions  . Fentanyl Swelling    Swelling of lips  . Oxaliplatin Itching  . Hydrocodone-Acetaminophen Hives    Current Facility-Administered Medications  Medication Dose Route Frequency Provider Last Rate Last Dose  . Marland KitchenTPN (CLINIMIX-E) Adult   Intravenous Continuous TPN Earna Coder, MD 75 mL/hr at 01/07/16 1800    . Marland KitchenTPN (CLINIMIX-E) Adult   Intravenous Continuous TPN Johney Maine, MD      . 0.9 % NaCl with KCl 20 mEq/ L  infusion   Intravenous Continuous Earna Coder, MD   Stopped at 01/08/16 1228  . [MAR Hold] acetaminophen (TYLENOL) tablet 650 mg  650 mg Oral Q8H PRN Earna Coder, MD   650 mg at 01/02/16 1907  . [MAR Hold] Chlorhexidine Gluconate Cloth 2 % PADS 6 each  6 each Topical Daily Earline Mayotte, MD   6 each at 01/08/16 0600  . [MAR Hold] famotidine (PEPCID) IVPB 20 mg premix  20 mg Intravenous Q24H Johney Maine, MD   20 mg at 01/08/16 0840  . HYDROmorphone (DILAUDID) 1 MG/ML injection           . HYDROmorphone (DILAUDID) injection 0.25-0.5 mg  0.25-0.5 mg Intravenous PRN Lenard Simmer, MD   0.5 mg at 01/08/16 1545  . [MAR Hold] HYDROmorphone (DILAUDID) injection 1 mg  1 mg Intravenous Q4H PRN Earna Coder, MD   1 mg at 01/08/16 1045  . [MAR Hold] insulin aspart (novoLOG) injection 0-9 Units  0-9 Units Subcutaneous 6 times per day Earna Coder, MD   1 Units at 01/08/16 0839  . [MAR Hold] LORazepam (ATIVAN) injection 0.5 mg  0.5 mg Intravenous q morning - 10a Johney Maine, MD   0.5 mg at 01/08/16 0840  . [MAR Hold] mupirocin ointment (BACTROBAN) 2 % 1 application  1 application Nasal  BID Earline Mayotte, MD   1 application at 01/08/16 [phone]  . [MAR Hold] ondansetron (ZOFRAN) injection 4 mg  4 mg Intravenous 4 times per day Johney Maine, MD   4 mg at 01/08/16 0842  . [MAR Hold] ondansetron (ZOFRAN) injection 4 mg  4 mg Intravenous Q4H PRN Johney Maine, MD   4 mg at 01/04/16 1627  . ondansetron (ZOFRAN) injection 4 mg  4 mg Intravenous Once PRN Gijsbertus F Darleene Cleaver, MD      . Mitzi Hansen Hold] phenol [facility]) mouth spray 1 spray  1 spray Mouth/Throat Q2H PRN Johney Maine, MD   1 spray at 01/05/16 7694   Facility-Administered Medications Ordered in Other Encounters  Medication Dose Route Frequency Provider Last Rate Last Dose  . sodium chloride 0.9 % injection 10 mL  10 mL Intracatheter PRN Johney Maine, MD  10 mL at 03/21/15 1025  . sodium chloride 0.9 % injection 10 mL  10 mL Intravenous PRN [name], MD   10 mL at 04/21/15 1025  . sodium chloride 0.9 % injection 10 mL  10 mL Intracatheter PRN [name], MD   10 mL at 12/03/15 1105    OBJECTIVE:  Filed Vitals:   01/08/16 1557 01/08/16 1612  BP: 90/39 87/42  Pulse: 98 95  Temp:    Resp: 19 17     Body mass index is 25.56 kg/(m^2).    ECOG FS:0 - Asymptomatic  PHYSICAL EXAM: GENERAL:  Well developed, well nourished, sitting comfortably in the exam room in no acute distress. MENTAL STATUS:  Alert and oriented to person, place and time. HEAD:  .  Normocephalic, atraumatic, face symmetric, no Cushingoid features. EYES:   Pupils equal round and reactive to light and accomodation.  No conjunctivitis or scleral icterus. ENT:  Oropharynx clear without lesion.  Tongue normal. Mucous membranes moist.  RESPIRATORY:  Clear to auscultation without rales, wheezes or rhonchi. CARDIOVASCULAR:  Regular rate and rhythm without murmur, rub or gallop. BREAST:  Right breast without masses, skin changes or nipple discharge.  Left breast without masses, skin changes or nipple discharge. ABDOMEN:  Soft, non-tender, with active  bowel sounds, and no hepatosplenomegaly.  No masses. BACK:  No CVA tenderness.  No tenderness on percussion of the back or rib cage. SKIN:  No rashes, ulcers or lesions. EXTREMITIES: No edema, no skin discoloration or tenderness.  No palpable cords. LYMPH NODES: No palpable cervical, supraclavicular, axillary or inguinal adenopathy  NEUROLOGICAL: Unremarkable. PSYCH:  Appropriate.     Admission on 12/31/2015  No results displayed because visit has over 200 results.      No results found for: LABCA2 Lab Results  Component Value Date   CA199 <1 02/03/2014   Lab Results  Component Value Date   CEA 5.6* 01/05/2016           ASSESSMENT:  1.  Carcinoma of colon stage IV disease progressing disease based on CT scan.    Upper GI series with small bowel follow up shows partial obstruction of the gastric outlet junction.  Patient is supposed to get extra protein laparotomy and possible bypass surgery to relieve symptoms.  I explained to the patient and family surgeries most likely palliative in nature.  The patient's general condition improves will see whether patient is a candidate for MEK 1 inhibitor therapy I will forward patient's guidance study to our research coordinator     Clinical: Stage IVB (yT3, N1, M1b) - Signed by [name], MD on 03/07/2015   [name], MD   01/08/2016 4:19 PM  [place] @ [place] [facility]

## 2016-01-08 NOTE — Op Note (Signed)
[facility] Gastroenterology Patient Name: Misty Mack Procedure Date: 01/08/2016 11:07 AM MRN: [id number] Account #: [id number] Date of Birth: 27-Oct-1971 Admit Type: Outpatient Age: 45 Room: OR 7 Gender: Female Note Status: Finalized Procedure:            Upper GI endoscopy Indications:          Abnormal UGI series Providers:            Robert Bellow, MD Medicines:            General Anesthesia Complications:        No immediate complications. Procedure:            Pre-Anesthesia Assessment:                       - Prior to the procedure, a History and Physical was                        performed, and patient medications, allergies and                        sensitivities were reviewed. The patient's tolerance of                        previous anesthesia was reviewed.                       - The risks and benefits of the procedure and the                        sedation options and risks were discussed with the                        patient. All questions were answered and informed                        consent was obtained.                       After obtaining informed consent, the endoscope was                        passed under direct vision. Throughout the procedure,                        the patient's blood pressure, pulse, and oxygen                        saturations were monitored continuously. The Endoscope                        was introduced through the mouth, and advanced to the                        fourth part of duodenum. The upper GI endoscopy was                        accomplished without difficulty. The patient tolerated                        the procedure well. Findings:      The  esophagus was normal.      A large, fungating, non-circumferential mass with no bleeding and       stigmata of recent bleeding was found on the posterior wall of the       gastric antrum.      A medium-sized fungating mass with no bleeding was  found in the fourth       portion of the duodenum. Impression:           - Normal esophagus.                       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                       - Malignant duodenal mass.                       - No specimens collected. Recommendation:       - The findings and recommendations were discussed with                        the referring physician. 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 All rights reserved. The codes documented in this report are preliminary and upon coder review may  be revised to meet current compliance requirements. Robert Bellow, MD 01/08/2016 12:48:24 PM This report has been signed electronically. Number of Addenda: 0 Note Initiated On: 01/08/2016 11:07 AM      [facility]

## 2016-01-08 NOTE — Op Note (Addendum)
Preoperative diagnosis: Obstruction of distal duodenum, possible gastric obstruction, known metastatic cancer.  Postoperative diagnosis: Same.  Procedure: Upper endoscopy, laparotomy, lysis of adhesions, gastro-jejunostomy, gastrostomy, biopsy of gastric mass.  Operating surgeon: Hervey Ard, M.D.  Asst. surgeon: Ellwood Sayers, M.D.  Anesthesia: Gen. endotracheal.  Clinical note: This 45 year old woman was noted with T4 M1 cancer of the distal transverse colon several years ago involving the diaphragm. Known metastatic disease on chemotherapy. Presented with a three-week history of progressive nausea and vomiting. CT showed massive dilatation of the stomach with a suggestion of a transit point at the junction of the fourth portion of the duodenum and the proximal jejunum. Upper GI yesterday suggested an intragastric mass not evident at the time of her January 2017 upper endoscopy as well as a mass involving the fourth portion the duodenum. Due to her inability to tolerate liquids she was felt to be candidate for exploration. She was not a candidate for radical resection.  Operative note: The patient had received her preoperative antibiotics several hours prior to the procedure when it was redosed with 2 g of Ancef. SCD stockings were used for DVT prevention. The patient underwent general endotracheal anesthesia. Upper endoscopy was completed (see separate note) which showed a mass on the medial posterior wall of the stomach above the incisura with evidence of old bleeding. No active bleeding. The scope was advanced to the fourth portion of the duodenum or a mass was invading from outside of the stomach causing the present obstructive symptoms.  The abdomen was prepped with ChloraPrep and draped. The previous midline incision was opened and the small hernias identified and dissected free. A few filmy adhesions to the anterior abdominal wall were taken down with cautery and sharp dissection. The mass  in the pancreatic area measured perhaps 8 cm in diameter and Zosyn fixation of the posterior gastric wall. The mass extended down to the ligament of Treitz where it was involved with the mass involving the distal duodenum. In consultation with Dr. Jamal Collin, it was elected to do a simple antecolic gastrojejunostomy with gastrostomy tube. Once the gastrotomy was made the tumor mass could be visualized and this was a friable mass perhaps 4-5 cm in diameter with evidence of a old central ulcer. A sharp biopsy was obtained and a figure-of-eight 3-0 Vicryl suture carefully passed through the very friable tissue for hemostasis. A side-to-side anastomosis was completed with a posterior row of 3-0 silk sutures followed by a full-thickness 3-0 Vicryl running locking suture on the posterior wall and Connell suture for the anterior wall. An anterior row of 3-0 silk sutures was used to complete the anastomosis which palpated to 3 fingers. A double pursestring of 2-0 Vicryl suture was placed in the anterior gastric wall and a 18 [place] Kangaroo gastrostomy tube brought through the left rectus muscle. This was passed with a double strand these were snugged down and the balloon inflated with 10 mL of water. The gastrostomy tube was then sutured to the anterior abdominal wall in 3 point fixation with the 2-0 Vicryl suture. Sponge tape and instrument counts were correct and the anterior abdominal wall was approximately with interrupted 0 Prolene figure-of-eight sutures. The soft tissues were irrigated and the adipose layer closed with a running 2-0 Vicryl suture. The skin was closed with staples. The gastrostomy tube was placed to closed suction. A honeycomb dressing was placed over the midline wound and the patient was taken to recovery in stable condition.

## 2016-01-08 NOTE — Progress Notes (Signed)
Pt left for surgery at this time. A/o. No distress. tpn turned off  Earlier. ,

## 2016-01-09 ENCOUNTER — Ambulatory Visit: Payer: 59

## 2016-01-09 ENCOUNTER — Encounter: Payer: Self-pay | Admitting: General Surgery

## 2016-01-09 LAB — GLUCOSE, CAPILLARY
GLUCOSE-CAPILLARY: 163 mg/dL — AB (ref 65–99)
GLUCOSE-CAPILLARY: 194 mg/dL — AB (ref 65–99)
Glucose-Capillary: 154 mg/dL — ABNORMAL HIGH (ref 65–99)
Glucose-Capillary: 180 mg/dL — ABNORMAL HIGH (ref 65–99)
Glucose-Capillary: 164 mg/dL — ABNORMAL HIGH (ref 65–99)
Glucose-Capillary: 153 mg/dL — ABNORMAL HIGH (ref 65–99)

## 2016-01-09 LAB — BASIC METABOLIC PANEL
Anion gap: 6 (ref 5–15)
BUN: 11 mg/dL (ref 6–20)
CHLORIDE: 105 mmol/L (ref 101–111)
CO2: 27 mmol/L (ref 22–32)
Calcium: 8.8 mg/dL — ABNORMAL LOW (ref 8.9–10.3)
Creatinine, Ser: 0.43 mg/dL — ABNORMAL LOW (ref 0.44–1.00)
GFR calc non Af Amer: >60 mL/min (ref >60)
Glucose, Bld: 168 mg/dL — ABNORMAL HIGH (ref 65–99)
POTASSIUM: 3.3 mmol/L — AB (ref 3.5–5.1)
SODIUM: 138 mmol/L (ref 135–145)

## 2016-01-09 LAB — PHOSPHORUS: PHOSPHORUS: 3.4 mg/dL (ref 2.5–4.6)

## 2016-01-09 LAB — MAGNESIUM: MAGNESIUM: 1.9 mg/dL (ref 1.7–2.4)

## 2016-01-09 MED ORDER — POTASSIUM CHLORIDE 10 MEQ/100ML IV SOLN
10.0000 meq | INTRAVENOUS | Status: AC
  Administered 2016-01-09 (×2): 10 meq via INTRAVENOUS
  Filled 2016-01-09 (×2): qty 100

## 2016-01-09 MED ORDER — TRACE MINERALS CR-CU-MN-SE-ZN 10-1000-500-60 MCG/ML IV SOLN
INTRAVENOUS | Status: AC
  Administered 2016-01-09: 18:00:00 via INTRAVENOUS
  Filled 2016-01-09: qty 1800

## 2016-01-09 NOTE — Progress Notes (Signed)
PARENTERAL NUTRITION CONSULT NOTE -follow up  Pharmacy Consult for TPN management - electrolytes and glucose Indication: Intractable N/V and SBO,  hx Metastatic colon cancer-  Allergies  Allergen Reactions  . Fentanyl Swelling    Swelling of lips  . Oxaliplatin Itching  . Hydrocodone-Acetaminophen Hives    Patient Measurements: Height: 5\' 4"  (162.6 cm) Weight: 147 lb 12.8 oz (67.042 kg) IBW/kg (Calculated) : 54.7 Adjusted Body Weight: 60.8 kg  Vital Signs: Temp: 98.2 F (36.8 C) (03/15 1407) Temp Source: Oral (03/15 1407) BP: 107/49 mmHg (03/15 1407) Pulse Rate: 98 (03/15 1407) Intake/Output from previous day: 03/14 0701 - 03/15 0700 In: 4039 [I.V.:2900; TPN:1139] Out: 2950 [Urine:2000; Drains:900; Blood:50] Intake/Output from this shift: Total I/O In: -  Out: 1640 [Urine:625; Drains:1015]  Labs:  Recent Labs  01/07/16 0336 01/08/16 0435  WBC 12.8* 12.5*  HGB 9.8* 9.8*  HCT 29.2* 28.1*  PLT 295 289     Recent Labs  01/07/16 0336 01/08/16 0435 01/09/16 1323  NA 139 136 138  K 3.9 3.7 3.3*  CL 102 100* 105  CO2 28 29 27   GLUCOSE 144* 146* 168*  BUN 13 15 11   CREATININE 0.52 0.51 0.43*  CALCIUM 9.4 9.2 8.8*  MG 2.3 2.1 1.9  PHOS 4.3 4.3 3.4   Estimated Creatinine Clearance: 83.6 mL/min (by C-G formula based on Cr of 0.43).    Recent Labs  01/09/16 0411 01/09/16 0705 01/09/16 1119  GLUCAP 154* 180* 164*    Medical History: Past Medical History  Diagnosis Date  . Ulcer   . Anemia   . Obesity   . Constipation   . Migraines   . Cancer ([place]) 2015    colon  . Colon cancer metastasized to liver [facility]) April 2015    T3, N2, M1, stage IV with biopsy-proven liver metastases. On adjuvant chemotherapy    Medications:  Scheduled:  . acetaminophen  1,000 mg Intravenous 4 times per day  . famotidine (PEPCID) IV  20 mg Intravenous Q24H  . insulin aspart  0-9 Units Subcutaneous 6 times per day  . LORazepam  0.5 mg Intravenous q morning - 10a   . mupirocin ointment  1 application Nasal BID  . potassium chloride  10 mEq Intravenous Q1 Hr x 2   Infusions:  . Marland KitchenTPN (CLINIMIX-E) Adult 75 mL/hr at 01/08/16 1746  . Marland KitchenTPN (CLINIMIX-E) Adult    . lactated ringers 50 mL/hr at 01/09/16 1305    Insulin Requirements in the past 24 hours:  SSI/24hr:  12 units  Current Nutrition:  Clinimix E 5/20% at goal rate of 66ml/hr  NPO with NG tube, ice chips  Assessment: Potassium: 3.3, Mag: 1.9, Phos: 3.4, Ca: 8.8, albumin: 3.0, corrected calcium: 9.6  Plan:  Will supplement with KCl 10 meq IV x 2 as potassium is 3.3.  All other electrolytes WNL.  Continue current SSI orders.   Pharmacy will continue to follow per consult.   Murrell Converse, PharmD Clinical Pharmacist 01/09/2016

## 2016-01-09 NOTE — Progress Notes (Signed)
AVSS. Good u/o. Good pain control. GT: 900 cc, no bleeding. Lungs: Clear. Cardio: RR. ABD: Soft. Doing well.  Plan: Ambulate.

## 2016-01-09 NOTE — Progress Notes (Signed)
Nutrition Follow-up  DOCUMENTATION CODES:   Severe malnutrition in context of chronic illness  INTERVENTION:   PN: Recommend continuing current TPN 5%AA/20%Dextrose at goal rate as ordered. Pharmacy following for electrolytes and glucose management.    NUTRITION DIAGNOSIS:   Malnutrition related to cancer and cancer related treatments, chronic illness as evidenced by energy intake < 75% for > or equal to 1 month, percent weight loss.  GOAL:   Patient will meet greater than or equal to 90% of their needs  MONITOR:   PO intake, Supplement acceptance, Labs, Weight trends, I & O's  REASON FOR ASSESSMENT:   Consult New TPN/TNA  ASSESSMENT:   Pt admitted with intractible n/v with hypokalemia. Pt with h/o colon cancer on palliative chemoradiation. 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   Pt POD1 of exp lap with lysis of adhesions, gastro-jejunostomy, gastrostomy tube placement and biopsy of gastric mass.  Diet Order:  Diet NPO time specified Except for: Sips with Meds .TPN (CLINIMIX-E) Adult .TPN (CLINIMIX-E) Adult    Current Nutrition: 5%AA/20%Dextrose at 52mL/hr. TPN off during surgical intervention yesterday and restarted after returning to the floor per Nsg   Gastrointestinal Profile: per documentation 926mL out gastrostomy tube yesterday, 1033mL thus far today Last BM:  01/03/2016  UOP: 2L in past 24 hours   Scheduled Medications:  . acetaminophen  1,000 mg Intravenous 4 times per day  . famotidine (PEPCID) IV  20 mg Intravenous Q24H  . insulin aspart  0-9 Units Subcutaneous 6 times per day  . LORazepam  0.5 mg Intravenous q morning - 10a  . mupirocin ointment  1 application Nasal BID    Continuous Medications:  . Marland KitchenTPN (CLINIMIX-E) Adult 75 mL/hr at 01/08/16 1746  . Marland KitchenTPN (CLINIMIX-E) Adult    . lactated ringers 50 mL/hr at 01/09/16 1305     Electrolyte/Renal Profile and Glucose Profile:   Recent  Labs Lab 01/07/16 0336 01/08/16 0435 01/09/16 1323  NA 139 136 138  K 3.9 3.7 3.3*  CL 102 100* 105  CO2 28 29 27   BUN 13 15 11   CREATININE 0.52 0.51 0.43*  CALCIUM 9.4 9.2 8.8*  MG 2.3 2.1 1.9  PHOS 4.3 4.3 3.4  GLUCOSE 144* 146* 168*   Protein Profile:   Recent Labs Lab 01/03/16 2132 01/05/16 0526  ALBUMIN 3.3* 3.0*     Weight Trend since Admission: Filed Weights   01/08/16 0423 01/09/16 0418 01/09/16 0607  Weight: 149 lb (67.586 kg) 147 lb 12.8 oz (67.042 kg) 147 lb 12.8 oz (67.042 kg)     BMI:  Body mass index is 25.36 kg/(m^2).  Estimated Nutritional Needs:   Kcal:  BEE: 1330kcals, TEE: (IF 1.1-1.3)(AF 1.2) 1755-2054kcals  Protein:  77-91g protein (1.1-1.3g/kg)  Fluid:  1750-2123mL of fluid (25-45mL/kg)  EDUCATION NEEDS:   No education needs identified at this time   [place], RD, LDN Pager [phone] Weekend/On-Call Pager [phone]

## 2016-01-10 ENCOUNTER — Inpatient Hospital Stay: Payer: 59

## 2016-01-10 ENCOUNTER — Ambulatory Visit: Payer: 59

## 2016-01-10 LAB — BASIC METABOLIC PANEL
ANION GAP: 6 (ref 5–15)
BUN: 10 mg/dL (ref 6–20)
CHLORIDE: 102 mmol/L (ref 101–111)
CO2: 27 mmol/L (ref 22–32)
Calcium: 8.7 mg/dL — ABNORMAL LOW (ref 8.9–10.3)
Creatinine, Ser: 0.49 mg/dL (ref 0.44–1.00)
GFR calc Af Amer: >60 mL/min (ref >60)
GLUCOSE: 147 mg/dL — AB (ref 65–99)
POTASSIUM: 3.2 mmol/L — AB (ref 3.5–5.1)
Sodium: 135 mmol/L (ref 135–145)

## 2016-01-10 LAB — GLUCOSE, CAPILLARY
GLUCOSE-CAPILLARY: 118 mg/dL — AB (ref 65–99)
Glucose-Capillary: 140 mg/dL — ABNORMAL HIGH (ref 65–99)
Glucose-Capillary: 146 mg/dL — ABNORMAL HIGH (ref 65–99)
Glucose-Capillary: 147 mg/dL — ABNORMAL HIGH (ref 65–99)
Glucose-Capillary: 170 mg/dL — ABNORMAL HIGH (ref 65–99)
Glucose-Capillary: 176 mg/dL — ABNORMAL HIGH (ref 65–99)

## 2016-01-10 LAB — PHOSPHORUS: Phosphorus: 3.6 mg/dL (ref 2.5–4.6)

## 2016-01-10 LAB — MAGNESIUM: Magnesium: 1.9 mg/dL (ref 1.7–2.4)

## 2016-01-10 LAB — SURGICAL PATHOLOGY

## 2016-01-10 MED ORDER — TRACE MINERALS CR-CU-MN-SE-ZN 10-1000-500-60 MCG/ML IV SOLN
INTRAVENOUS | Status: DC
  Filled 2016-01-10: qty 1800

## 2016-01-10 MED ORDER — POTASSIUM CHLORIDE 2 MEQ/ML IV SOLN
INTRAVENOUS | Status: DC
  Filled 2016-01-10: qty 1800

## 2016-01-10 MED ORDER — ACETAMINOPHEN 10 MG/ML IV SOLN
1000.0000 mg | Freq: Four times a day (QID) | INTRAVENOUS | Status: AC
  Administered 2016-01-10 – 2016-01-11 (×4): 1000 mg via INTRAVENOUS
  Filled 2016-01-10 (×5): qty 100

## 2016-01-10 MED ORDER — POTASSIUM CHLORIDE 2 MEQ/ML IV SOLN
INTRAVENOUS | Status: AC
  Administered 2016-01-10: 18:00:00 via INTRAVENOUS
  Filled 2016-01-10: qty 1800

## 2016-01-10 NOTE — Progress Notes (Addendum)
Afebrile, blood pressure and pulse stable.  High gastrostomy output. Excellent urine output. Volume depletion noted during procedure resolved with fluid administration.  Good pain control with Dilaudid, reported better pain control with combination of IV Tylenol and Dilaudid. Not able to take oral meds at this time. Not a candidate for nonsteroidals with the gastric mass with evidence of prior bleeding.  Lungs: Clear.  Cardiac: Regular rhythm.  Abdomen: Minimal distention, soft, minimal tenderness (postmedication) sees.  Wound: Clean.  Laboratory studies: Potassium down at 3.2, likely aggravated by GT drainage. Discussed with pharmacy increasing KCl in TPN by 100 mEq per to liter bag. Based on the present administration at 75 mL/h this would add 80 mEq a day.  We'll get a KUB to see if the barium administered for her upper GI is cleared. If not we'll consider a saline enema.  Will consider putting the GT tube to hang on the IV pole to see if this is tolerated with improved gastric drainage after the KUB is reviewed.  Importance of ambulation reviewed.   Plain films reviewed. Majority of barium in the right colon. No significant small bowel or gastric residual.  No SB dilatation. Will elevate gastrostomy bag to decrease sump function and follow output.

## 2016-01-10 NOTE — Plan of Care (Signed)
Problem: Activity: Goal: Ability to implement measures to reduce episodes of fatigue will improve Outcome: Progressing Pt ambulated today once in the hall with staff. Tolerated well.  Problem: Coping: Goal: Ability to identify and develop effective coping behavior will improve Outcome: Progressing Pt is NPO. TPN continues. No flatus yet per pt. Gastric tube draining green output. Abdominal incision WDL. Pt refused radiation today.

## 2016-01-11 ENCOUNTER — Ambulatory Visit: Payer: 59

## 2016-01-11 LAB — CBC WITH DIFFERENTIAL/PLATELET
Basophils Absolute: 0 10*3/uL (ref 0–0.1)
Basophils Relative: 0 %
Eosinophils Absolute: 0.3 10*3/uL (ref 0–0.7)
Eosinophils Relative: 2 %
HCT: 23.2 % — ABNORMAL LOW (ref 35.0–47.0)
HEMOGLOBIN: 7.8 g/dL — AB (ref 12.0–16.0)
LYMPHS ABS: 0.7 10*3/uL — AB (ref 1.0–3.6)
LYMPHS PCT: 6 %
MCH: 30.3 pg (ref 26.0–34.0)
MCHC: 33.7 g/dL (ref 32.0–36.0)
MCV: 89.9 fL (ref 80.0–100.0)
MONOS PCT: 10 %
Monocytes Absolute: 1.1 10*3/uL — ABNORMAL HIGH (ref 0.2–0.9)
NEUTROS PCT: 82 %
Neutro Abs: 9.2 10*3/uL — ABNORMAL HIGH (ref 1.4–6.5)
Platelets: 243 10*3/uL (ref 150–440)
RBC: 2.58 MIL/uL — AB (ref 3.80–5.20)
RDW: 20.3 % — ABNORMAL HIGH (ref 11.5–14.5)
WBC: 11.2 10*3/uL — AB (ref 3.6–11.0)

## 2016-01-11 LAB — BASIC METABOLIC PANEL
Anion gap: 7 (ref 5–15)
BUN: 10 mg/dL (ref 6–20)
CHLORIDE: 97 mmol/L — AB (ref 101–111)
CO2: 29 mmol/L (ref 22–32)
Calcium: 8.7 mg/dL — ABNORMAL LOW (ref 8.9–10.3)
Creatinine, Ser: 0.45 mg/dL (ref 0.44–1.00)
GFR calc Af Amer: >60 mL/min (ref >60)
GFR calc non Af Amer: >60 mL/min (ref >60)
GLUCOSE: 148 mg/dL — AB (ref 65–99)
POTASSIUM: 3.1 mmol/L — AB (ref 3.5–5.1)
Sodium: 133 mmol/L — ABNORMAL LOW (ref 135–145)

## 2016-01-11 LAB — GLUCOSE, CAPILLARY
GLUCOSE-CAPILLARY: 138 mg/dL — AB (ref 65–99)
Glucose-Capillary: 122 mg/dL — ABNORMAL HIGH (ref 65–99)
Glucose-Capillary: 125 mg/dL — ABNORMAL HIGH (ref 65–99)
Glucose-Capillary: 130 mg/dL — ABNORMAL HIGH (ref 65–99)
Glucose-Capillary: 139 mg/dL — ABNORMAL HIGH (ref 65–99)
Glucose-Capillary: 144 mg/dL — ABNORMAL HIGH (ref 65–99)
Glucose-Capillary: 160 mg/dL — ABNORMAL HIGH (ref 65–99)

## 2016-01-11 MED ORDER — TRACE MINERALS CR-CU-MN-SE-ZN 10-1000-500-60 MCG/ML IV SOLN
INTRAVENOUS | Status: AC
  Administered 2016-01-11: 19:00:00 via INTRAVENOUS
  Filled 2016-01-11 (×2): qty 1800

## 2016-01-11 MED ORDER — ACETAMINOPHEN 10 MG/ML IV SOLN
1000.0000 mg | Freq: Four times a day (QID) | INTRAVENOUS | Status: AC
Start: 2016-01-11 — End: 2016-01-12
  Administered 2016-01-11 – 2016-01-12 (×4): 1000 mg via INTRAVENOUS
  Filled 2016-01-11 (×4): qty 100

## 2016-01-11 NOTE — Progress Notes (Signed)
Nutrition Follow-up  DOCUMENTATION CODES:   Severe malnutrition in context of chronic illness  INTERVENTION:   PN: Continuing current 5%AA/20% Dextrose at goal rate of 20mL/hr. RD notes advancement in diet order to CL. Recommend continuing TPN until pt able to tolerate solid foods and able to eat atleast 50% of meal trays. Pharmacy following and adjusting electrolytes in TPN. Will continue to follow and assess.   NUTRITION DIAGNOSIS:   Malnutrition related to cancer and cancer related treatments, chronic illness as evidenced by energy intake < 75% for > or equal to 1 month, percent weight loss.  GOAL:   Patient will meet greater than or equal to 90% of their needs  MONITOR:   PO intake, Supplement acceptance, Labs, Weight trends, I & O's  REASON FOR ASSESSMENT:   Consult New TPN/TNA  ASSESSMENT:   Pt admitted with intractible n/v with hypokalemia. Pt with h/o colon cancer on palliative chemoradiation.  Pt with gastrostomy tube to drainage. Per MD note pt abdomen soft, +BS and has passed some flatus. Pt able to ambulate some per Nsg.  Diet Order:  .TPN (CLINIMIX-E) Adult .TPN (CLINIMIX-E) Adult Diet clear liquid Room service appropriate?: Yes; Fluid consistency:: Thin    PN: 5%AA/20%Dextrose at 26mL/hr   Gastrointestinal Profile: +flatus Last BM: 01/03/2016 UOP:  2425mL in past 24 hours, 635mL noted this am Gastrostomy tube: 2214mL output in past 24hours  Scheduled Medications:  . acetaminophen  1,000 mg Intravenous 4 times per day  . famotidine (PEPCID) IV  20 mg Intravenous Q24H  . insulin aspart  0-9 Units Subcutaneous 6 times per day  . LORazepam  0.5 mg Intravenous q morning - 10a  . mupirocin ointment  1 application Nasal BID    Continuous Medications:  . Marland KitchenTPN (CLINIMIX-E) Adult 75 mL/hr at 01/11/16 1000  . Marland KitchenTPN (CLINIMIX-E) Adult       Electrolyte/Renal Profile and Glucose Profile:   Recent Labs Lab 01/08/16 0435 01/09/16 1323 01/10/16 0338  01/11/16 0608  NA 136 138 135 133*  K 3.7 3.3* 3.2* 3.1*  CL 100* 105 102 97*  CO2 29 27 27 29   BUN 15 11 10 10   CREATININE 0.51 0.43* 0.49 0.45  CALCIUM 9.2 8.8* 8.7* 8.7*  MG 2.1 1.9 1.9  --   PHOS 4.3 3.4 3.6  --   GLUCOSE 146* 168* 147* 148*   Protein Profile:  Recent Labs Lab 01/05/16 0526  ALBUMIN 3.0*     Weight Trend since Admission: Filed Weights   01/09/16 0607 01/10/16 0410 01/11/16 0500  Weight: 147 lb 12.8 oz (67.042 kg) 147 lb 3.2 oz (66.769 kg) 158 lb 3.2 oz (71.759 kg)     BMI:  Body mass index is 27.14 kg/(m^2).  Estimated Nutritional Needs:   Kcal:  BEE: 1330kcals, TEE: (IF 1.1-1.3)(AF 1.2) 1755-2054kcals  Protein:  77-91g protein (1.1-1.3g/kg)  Fluid:  1750-2167mL of fluid (25-20mL/kg)  EDUCATION NEEDS:   No education needs identified at this time   [place], RD, LDN Pager [phone] Weekend/On-Call Pager [phone]

## 2016-01-11 NOTE — Addendum Note (Signed)
Addendum  created 01/11/16 1157 by Doreen Salvage, CRNA   Modules edited: Anesthesia Responsible Staff

## 2016-01-11 NOTE — Progress Notes (Signed)
PARENTERAL NUTRITION CONSULT NOTE -follow up  Pharmacy Consult for TPN management - electrolytes and glucose Indication: Intractable N/V and SBO,  hx Metastatic colon cancer-  Allergies  Allergen Reactions  . Fentanyl Swelling    Swelling of lips  . Oxaliplatin Itching  . Hydrocodone-Acetaminophen Hives    Patient Measurements: Height: 5\' 4"  (162.6 cm) Weight: 158 lb 3.2 oz (71.759 kg) IBW/kg (Calculated) : 54.7 Adjusted Body Weight: 60.8 kg  Vital Signs: Temp: 98.2 F (36.8 C) (03/17 1650) Temp Source: Oral (03/17 1650) BP: 106/55 mmHg (03/17 1650) Pulse Rate: 108 (03/17 1650) Intake/Output from previous day: 03/16 0701 - 03/17 0700 In: -  Out: [id number] [Urine:2450; Drains:2225] Intake/Output from this shift: Total I/O In: 9075.8 [I.V.:3704.6; TPN:5371.3] Out: 2370 [Urine:650; Drains:1720]  Labs:  Recent Labs  01/11/16 0608  WBC 11.2*  HGB 7.8*  HCT 23.2*  PLT 243     Recent Labs  01/09/16 1323 01/10/16 0338 01/11/16 0608  NA 138 135 133*  K 3.3* 3.2* 3.1*  CL 105 102 97*  CO2 27 27 29   GLUCOSE 168* 147* 148*  BUN 11 10 10   CREATININE 0.43* 0.49 0.45  CALCIUM 8.8* 8.7* 8.7*  MG 1.9 1.9  --   PHOS 3.4 3.6  --    Estimated Creatinine Clearance: 86.2 mL/min (by C-G formula based on Cr of 0.45).    Recent Labs  01/11/16 0810 01/11/16 1148 01/11/16 1636  GLUCAP 160* 138* 139*    Medical History: Past Medical History  Diagnosis Date  . Ulcer   . Anemia   . Obesity   . Constipation   . Migraines   . Cancer ([place]) 2015    colon  . Colon cancer metastasized to liver [facility]) April 2015    T3, N2, M1, stage IV with biopsy-proven liver metastases. On adjuvant chemotherapy    Medications:  Scheduled:  . acetaminophen  1,000 mg Intravenous 4 times per day  . famotidine (PEPCID) IV  20 mg Intravenous Q24H  . insulin aspart  0-9 Units Subcutaneous 6 times per day  . LORazepam  0.5 mg Intravenous q morning - 10a  . mupirocin ointment  1  application Nasal BID   Infusions:  . Marland KitchenTPN (CLINIMIX-E) Adult      Insulin Requirements in the past 24 hours:  SSI/24hr:  9 units  Current Nutrition:  Clinimix E 5/20% at goal rate of 47ml/hr  NPO with NG tube, ice chips  Assessment: Potassium remains low. MD requests supplementation performed through TPN. TPN ordered 3/16 had additional 30 mEq per 2L.    Plan:  Will increase potassium supplementation in TPN to 165mEq/2L. Will obtain follow up labs in am.    Will continue SSI.    Pharmacy will continue to monitor and adjust per consult.   [name]

## 2016-01-11 NOTE — Progress Notes (Signed)
AVSS.  GT drainage less when in proper position on IV pole. Much less bile. Has passed a small amount of gas. No nausea. Lungs: Clear. Cardio: RR. ABD: Soft, BS+. Wound: Clean. U/O: Excellent. Labs: K+ down minimally from 3.2 to 3.1.  Now with an additional 100 meQ of KCL/ 2 L TPN. Should improve. HGB down w/o symptoms.  WBC trending down. Path: Metastatic colon cancer. Plan: Leave GT in vent position (taped to IV pole in proper place, to be removed to floor only if nauseated." Trial po liquids. Change tylenol from IV to po tomorrow if liquids tolerated.  (IV tylenol has helped significantly with her pain control, minimizing narcotics.) Dr. Tamala Julian to follow until Monday.

## 2016-01-12 LAB — BASIC METABOLIC PANEL
Anion gap: 10 (ref 5–15)
BUN: 13 mg/dL (ref 6–20)
CALCIUM: 10.1 mg/dL (ref 8.9–10.3)
CO2: 32 mmol/L (ref 22–32)
CREATININE: 0.49 mg/dL (ref 0.44–1.00)
Chloride: 101 mmol/L (ref 101–111)
GFR calc Af Amer: >60 mL/min (ref >60)
GLUCOSE: 142 mg/dL — AB (ref 65–99)
Potassium: 3.8 mmol/L (ref 3.5–5.1)
Sodium: 143 mmol/L (ref 135–145)

## 2016-01-12 LAB — GLUCOSE, CAPILLARY
GLUCOSE-CAPILLARY: 148 mg/dL — AB (ref 65–99)
GLUCOSE-CAPILLARY: 140 mg/dL — AB (ref 65–99)
Glucose-Capillary: 155 mg/dL — ABNORMAL HIGH (ref 65–99)
Glucose-Capillary: 158 mg/dL — ABNORMAL HIGH (ref 65–99)
Glucose-Capillary: 176 mg/dL — ABNORMAL HIGH (ref 65–99)
Glucose-Capillary: 218 mg/dL — ABNORMAL HIGH (ref 65–99)

## 2016-01-12 LAB — PHOSPHORUS: PHOSPHORUS: 4.7 mg/dL — AB (ref 2.5–4.6)

## 2016-01-12 LAB — MAGNESIUM: MAGNESIUM: 2.3 mg/dL (ref 1.7–2.4)

## 2016-01-12 MED ORDER — ZOLPIDEM TARTRATE 5 MG PO TABS
5.0000 mg | ORAL_TABLET | Freq: Every evening | ORAL | Status: DC | PRN
  Administered 2016-01-13 – 2016-01-14 (×2): 5 mg via ORAL
  Filled 2016-01-12 (×2): qty 1

## 2016-01-12 MED ORDER — ZOLPIDEM TARTRATE 5 MG PO TABS
5.0000 mg | ORAL_TABLET | Freq: Every evening | ORAL | Status: DC | PRN
  Filled 2016-01-12: qty 1

## 2016-01-12 MED ORDER — DEXTROSE IN LACTATED RINGERS 5 % IV SOLN
Freq: Once | INTRAVENOUS | Status: AC
  Administered 2016-01-12: 15:00:00 via INTRAVENOUS

## 2016-01-12 MED ORDER — TRACE MINERALS CR-CU-MN-SE-ZN 10-1000-500-60 MCG/ML IV SOLN
INTRAVENOUS | Status: AC
  Administered 2016-01-12: 18:00:00 via INTRAVENOUS
  Filled 2016-01-12: qty 1800

## 2016-01-12 MED ORDER — DEXTROSE IN LACTATED RINGERS 5 % IV SOLN
INTRAVENOUS | Status: DC
  Administered 2016-01-12 – 2016-01-13 (×2): via INTRAVENOUS

## 2016-01-12 NOTE — Progress Notes (Signed)
PARENTERAL NUTRITION CONSULT NOTE -follow up  Pharmacy Consult for TPN management - electrolytes and glucose Indication: Intractable N/V and SBO,  hx Metastatic colon cancer-  Allergies  Allergen Reactions  . Fentanyl Swelling    Swelling of lips  . Oxaliplatin Itching  . Hydrocodone-Acetaminophen Hives    Patient Measurements: Height: 5\' 4"  (162.6 cm) Weight: 151 lb 8 oz (68.72 kg) IBW/kg (Calculated) : 54.7 Adjusted Body Weight: 60.8 kg  Vital Signs: Temp: 97.6 F (36.4 C) (03/18 0525) Temp Source: Oral (03/18 0525) BP: 97/50 mmHg (03/18 0525) Pulse Rate: 112 (03/18 0525) Intake/Output from previous day: 03/17 0701 - 03/18 0700 In: 9075.8 [I.V.:3704.6; TPN:5371.3] Out: [id number] [Urine:1550; Drains:3945] Intake/Output from this shift:    Labs:  Recent Labs  01/11/16 0608  WBC 11.2*  HGB 7.8*  HCT 23.2*  PLT 243     Recent Labs  01/09/16 1323 01/10/16 0338 01/11/16 0608 01/12/16 0549  NA 138 135 133* 143  K 3.3* 3.2* 3.1* 3.8  CL 105 102 97* 101  CO2 27 27 29  32  GLUCOSE 168* 147* 148* 142*  BUN 11 10 10 13   CREATININE 0.43* 0.49 0.45 0.49  CALCIUM 8.8* 8.7* 8.7* 10.1  MG 1.9 1.9  --  2.3  PHOS 3.4 3.6  --  4.7*   Estimated Creatinine Clearance: 84.5 mL/min (by C-G formula based on Cr of 0.49).    Recent Labs  01/11/16 2333 01/12/16 0340 01/12/16 0729  GLUCAP 122* 155* 158*    Medical History: Past Medical History  Diagnosis Date  . Ulcer   . Anemia   . Obesity   . Constipation   . Migraines   . Cancer ([place]) 2015    colon  . Colon cancer metastasized to liver [facility]) April 2015    T3, N2, M1, stage IV with biopsy-proven liver metastases. On adjuvant chemotherapy    Medications:  Scheduled:  . famotidine (PEPCID) IV  20 mg Intravenous Q24H  . insulin aspart  0-9 Units Subcutaneous 6 times per day  . LORazepam  0.5 mg Intravenous q morning - 10a  . mupirocin ointment  1 application Nasal BID   Infusions:  . Marland KitchenTPN (CLINIMIX-E) Adult  75 mL/hr at 01/11/16 1855  . Marland KitchenTPN (CLINIMIX-E) Adult      Insulin Requirements in the past 24 hours:  SSI/24hr:  8 units  Current Nutrition:  Clinimix E 5/20% at goal rate of 70ml/hr  NPO with NG tube, ice chips  Assessment: MD requests supplementation performed through TPN. TPN ordered 3/17 had additional 100 mEq per 2L.    Plan:  Will decrease potassium supplementation in TPN to 22mEq/2L. Will obtain follow up labs in am.    Will continue SSI.    Pharmacy will continue to monitor and adjust per consult.   [name]

## 2016-01-12 NOTE — Plan of Care (Signed)
Problem: Nutritional: Goal: Maintenance of adequate nutrition will improve Outcome: Progressing Pt ambulate about 60ft , c/o dizziness.  At 1445 BP 86/46, HR in the 120's. Dr Tamala Julian notified of VS and Gastric tube output of 249ml this shift. Bolus and IVF ordered. BP post bolus 103/56, HR 118. Abdominal and lower back pain managed with dilaudid. Small loose brown stool per pt.

## 2016-01-12 NOTE — Progress Notes (Signed)
It is now 4 days after gastrojejunostomy and gastrostomy.  She reports moderate abdominal discomfort.  She was able to walk a short distance in the hallway today.  She reports she has been drinking some clear liquids.  She has had continued large volume drainage per gastrostomy tube with bag attached to the IV pole.  She has passed a minimal amount of flatus and no bowel movement.  She reports no difficulty breathing.  She is having some difficulty sleeping.  Urine output is good.  Vital signs are stable although she is mildly hypotensive.  She is awake alert and oriented.  Lung sounds are clear.  Abdomen is soft with no significant tenderness.  Her midline incision appears to be healing satisfactorily.  Her gastrostomy site appears typical.  She is wearing below-knee stockings  Lab work noted.  Pharmacy is monitoring her electrolytes and prescribing TPN.  Impression persistent large volume drainage from gastrostomy tube.  Not yet ready to go to oral Tylenol.  We will give Ambien sublingual  at bedtime as needed for sleep.  Continue to gradually increase walking

## 2016-01-13 LAB — BASIC METABOLIC PANEL
ANION GAP: 2 — AB (ref 5–15)
BUN: 15 mg/dL (ref 6–20)
CALCIUM: 9.6 mg/dL (ref 8.9–10.3)
CO2: 34 mmol/L — AB (ref 22–32)
Chloride: 103 mmol/L (ref 101–111)
Creatinine, Ser: 0.49 mg/dL (ref 0.44–1.00)
GFR calc non Af Amer: >60 mL/min (ref >60)
GLUCOSE: 189 mg/dL — AB (ref 65–99)
POTASSIUM: 4.3 mmol/L (ref 3.5–5.1)
Sodium: 139 mmol/L (ref 135–145)

## 2016-01-13 LAB — GLUCOSE, CAPILLARY
GLUCOSE-CAPILLARY: 182 mg/dL — AB (ref 65–99)
GLUCOSE-CAPILLARY: 148 mg/dL — AB (ref 65–99)
GLUCOSE-CAPILLARY: 179 mg/dL — AB (ref 65–99)
Glucose-Capillary: 153 mg/dL — ABNORMAL HIGH (ref 65–99)
Glucose-Capillary: 153 mg/dL — ABNORMAL HIGH (ref 65–99)

## 2016-01-13 LAB — MAGNESIUM: Magnesium: 2.1 mg/dL (ref 1.7–2.4)

## 2016-01-13 LAB — PHOSPHORUS: PHOSPHORUS: 4.3 mg/dL (ref 2.5–4.6)

## 2016-01-13 MED ORDER — SODIUM CHLORIDE 0.9% FLUSH
10.0000 mL | Freq: Two times a day (BID) | INTRAVENOUS | Status: DC
  Administered 2016-01-15: 10 mL
  Administered 2016-01-16: 40 mL
  Administered 2016-01-17: 20 mL
  Administered 2016-01-18: 09:00:00 10 mL

## 2016-01-13 MED ORDER — DEXTROSE-NACL 5-0.9 % IV SOLN
INTRAVENOUS | Status: DC
  Administered 2016-01-13 – 2016-01-15 (×2): via INTRAVENOUS

## 2016-01-13 MED ORDER — INSULIN ASPART 100 UNIT/ML ~~LOC~~ SOLN
0.0000 [IU] | SUBCUTANEOUS | Status: DC
  Administered 2016-01-13 (×3): 3 [IU] via SUBCUTANEOUS
  Administered 2016-01-13 – 2016-01-14 (×5): 2 [IU] via SUBCUTANEOUS
  Administered 2016-01-14: 3 [IU] via SUBCUTANEOUS
  Administered 2016-01-14: 2 [IU] via SUBCUTANEOUS
  Administered 2016-01-15 (×2): 3 [IU] via SUBCUTANEOUS
  Administered 2016-01-15: 17:00:00 2 [IU] via SUBCUTANEOUS
  Administered 2016-01-15 (×2): 3 [IU] via SUBCUTANEOUS
  Administered 2016-01-16: 2 [IU] via SUBCUTANEOUS
  Administered 2016-01-16: 08:00:00 3 [IU] via SUBCUTANEOUS
  Administered 2016-01-16 (×3): 2 [IU] via SUBCUTANEOUS
  Administered 2016-01-17 (×3): 3 [IU] via SUBCUTANEOUS
  Administered 2016-01-17: 2 [IU] via SUBCUTANEOUS
  Administered 2016-01-17 (×2): 3 [IU] via SUBCUTANEOUS
  Administered 2016-01-18 (×3): 2 [IU] via SUBCUTANEOUS
  Filled 2016-01-13: qty 2
  Filled 2016-01-13 (×3): qty 3
  Filled 2016-01-13 (×2): qty 2
  Filled 2016-01-13: qty 3
  Filled 2016-01-13: qty 2
  Filled 2016-01-13: qty 3
  Filled 2016-01-13: qty 2
  Filled 2016-01-13 (×3): qty 3
  Filled 2016-01-13 (×3): qty 2
  Filled 2016-01-13: qty 3
  Filled 2016-01-13: qty 2
  Filled 2016-01-13: qty 3
  Filled 2016-01-13 (×4): qty 2
  Filled 2016-01-13 (×5): qty 3
  Filled 2016-01-13: qty 2

## 2016-01-13 MED ORDER — TRACE MINERALS CR-CU-MN-SE-ZN 10-1000-500-60 MCG/ML IV SOLN
INTRAVENOUS | Status: AC
  Administered 2016-01-13: 19:00:00 via INTRAVENOUS
  Filled 2016-01-13: qty 1800

## 2016-01-13 MED ORDER — SODIUM CHLORIDE 0.9% FLUSH: 10.0000 mL | INTRAVENOUS | Status: DC | PRN

## 2016-01-13 NOTE — Progress Notes (Signed)
Dear Doctor: This patient has been identified as a candidate for PICC for the following reason (s): poor veins/poor circulatory system (CHF, COPD, emphysema, diabetes, steroid use, IV drug abuse, etc.) If you agree, please write an order for the indicated device. For any questions contact the Vascular Access Team at [facility] if no answer, please leave a message. Also consider writing an order for ECG tip confirmation after placement.  Thank you for supporting the early vascular access assessment program.

## 2016-01-13 NOTE — Progress Notes (Signed)
PARENTERAL NUTRITION CONSULT NOTE -follow up  Pharmacy Consult for TPN management - electrolytes and glucose Indication: Intractable N/V and SBO,  hx Metastatic colon cancer-  Allergies  Allergen Reactions  . Fentanyl Swelling    Swelling of lips  . Oxaliplatin Itching  . Hydrocodone-Acetaminophen Hives    Patient Measurements: Height: 5\' 4"  (162.6 cm) Weight: 151 lb 4.8 oz (68.629 kg) IBW/kg (Calculated) : 54.7 Adjusted Body Weight: 60.8 kg  Vital Signs: Temp: 99.3 F (37.4 C) (03/19 0443) Temp Source: Oral (03/19 0443) BP: 101/59 mmHg (03/19 0712) Pulse Rate: 120 (03/19 0712) Intake/Output from previous day: 03/18 0701 - 03/19 0700 In: 600 [P.O.:600] Out: 5850 [Urine:1300; Drains:4550] Intake/Output from this shift:    Labs:  Recent Labs  01/11/16 0608  WBC 11.2*  HGB 7.8*  HCT 23.2*  PLT 243     Recent Labs  01/11/16 0608 01/12/16 0549 01/13/16 0525  NA 133* 143 139  K 3.1* 3.8 4.3  CL 97* 101 103  CO2 29 32 34*  GLUCOSE 148* 142* 189*  BUN 10 13 15   CREATININE 0.45 0.49 0.49  CALCIUM 8.7* 10.1 9.6  MG  --  2.3 2.1  PHOS  --  4.7* 4.3   Estimated Creatinine Clearance: 84.5 mL/min (by C-G formula based on Cr of 0.49).    Recent Labs  01/12/16 2007 01/12/16 2307 01/13/16 0410  GLUCAP 148* 140* 182*    Medical History: Past Medical History  Diagnosis Date  . Ulcer   . Anemia   . Obesity   . Constipation   . Migraines   . Cancer ([place]) 2015    colon  . Colon cancer metastasized to liver [facility]) April 2015    T3, N2, M1, stage IV with biopsy-proven liver metastases. On adjuvant chemotherapy    Medications:  Scheduled:  . famotidine (PEPCID) IV  20 mg Intravenous Q24H  . insulin aspart  0-15 Units Subcutaneous 6 times per day  . LORazepam  0.5 mg Intravenous q morning - 10a   Infusions:  . Marland KitchenTPN (CLINIMIX-E) Adult 75 mL/hr at 01/12/16 1803  . Marland KitchenTPN (CLINIMIX-E) Adult    . dextrose 5% lactated ringers 50 mL/hr at 01/13/16 0228     Insulin Requirements in the past 24 hours:  SSI/24hr:  9 units  Current Nutrition:  Clinimix E 5/20% at goal rate of 81ml/hr  NPO with NG tube, ice chips  Assessment: MD requests supplementation performed through TPN. TPN ordered 3/18 had additional 30 mEq per 2L.    Plan:  Will remove potassium supplementation in TPN. Will obtain follow up labs in am.    Will continue SSI at moderate scale.    Pharmacy will continue to monitor and adjust per consult.   [name]

## 2016-01-13 NOTE — Progress Notes (Signed)
She had some hypotension yesterday and appeared to likely have some mild dehydration.  She was started on IV fluid.  She reports she did not do much walking yesterday as she felt dizzy when she stood up.  She feels better today.  She has been drinking some liquids which mostly are coming out the G-tube.  She reports she has been passing some gas and did have a small bowel movement.  She reports minimal abdominal discomfort on the left side.  She has had some scant drainage from her gastrostomy.  On examination she is awake alert and oriented.  Abdomen is soft with no significant tenderness.  There is some scant clear colorless fluid draining from the gastrostomy site.  A dressing was applied.  Lab work reviewed bicarbonate is elevated at 34.  Impression persistent significant drainage from the gastrostomy tube.  Continue with IV fluid hydration.  Encourage walking. Encouraged to sit up in chair some of the day.

## 2016-01-14 ENCOUNTER — Ambulatory Visit: Payer: 59

## 2016-01-14 ENCOUNTER — Encounter: Payer: Self-pay | Admitting: *Deleted

## 2016-01-14 LAB — BASIC METABOLIC PANEL
Anion gap: 6 (ref 5–15)
BUN: 15 mg/dL (ref 6–20)
CHLORIDE: 103 mmol/L (ref 101–111)
CO2: 30 mmol/L (ref 22–32)
CREATININE: 0.48 mg/dL (ref 0.44–1.00)
Calcium: 9.5 mg/dL (ref 8.9–10.3)
Glucose, Bld: 166 mg/dL — ABNORMAL HIGH (ref 65–99)
Potassium: 3.7 mmol/L (ref 3.5–5.1)
SODIUM: 139 mmol/L (ref 135–145)

## 2016-01-14 LAB — CBC WITH DIFFERENTIAL/PLATELET
Basophils Absolute: 0 10*3/uL (ref 0–0.1)
Basophils Relative: 0 %
EOS PCT: 3 %
Eosinophils Absolute: 0.4 10*3/uL (ref 0–0.7)
HEMATOCRIT: 24.6 % — AB (ref 35.0–47.0)
Hemoglobin: 8.2 g/dL — ABNORMAL LOW (ref 12.0–16.0)
LYMPHS PCT: 6 %
Lymphs Abs: 0.8 10*3/uL — ABNORMAL LOW (ref 1.0–3.6)
MCH: 30 pg (ref 26.0–34.0)
MCHC: 33.4 g/dL (ref 32.0–36.0)
MCV: 89.6 fL (ref 80.0–100.0)
MONO ABS: 0.9 10*3/uL (ref 0.2–0.9)
MONOS PCT: 6 %
NEUTROS ABS: 11.7 10*3/uL — AB (ref 1.4–6.5)
Neutrophils Relative %: 85 %
Platelets: 323 10*3/uL (ref 150–440)
RBC: 2.74 MIL/uL — ABNORMAL LOW (ref 3.80–5.20)
RDW: 20.6 % — AB (ref 11.5–14.5)
WBC: 13.8 10*3/uL — ABNORMAL HIGH (ref 3.6–11.0)

## 2016-01-14 LAB — ABO/RH: ABO/RH(D): O POS

## 2016-01-14 LAB — GLUCOSE, CAPILLARY
GLUCOSE-CAPILLARY: 142 mg/dL — AB (ref 65–99)
GLUCOSE-CAPILLARY: 154 mg/dL — AB (ref 65–99)
GLUCOSE-CAPILLARY: 151 mg/dL — AB (ref 65–99)
Glucose-Capillary: 141 mg/dL — ABNORMAL HIGH (ref 65–99)
Glucose-Capillary: 147 mg/dL — ABNORMAL HIGH (ref 65–99)
Glucose-Capillary: 149 mg/dL — ABNORMAL HIGH (ref 65–99)
Glucose-Capillary: 151 mg/dL — ABNORMAL HIGH (ref 65–99)

## 2016-01-14 LAB — PHOSPHORUS: PHOSPHORUS: 4.5 mg/dL (ref 2.5–4.6)

## 2016-01-14 LAB — TYPE AND SCREEN
ABO/RH(D): O POS
Antibody Screen: NEGATIVE

## 2016-01-14 LAB — MAGNESIUM: Magnesium: 2 mg/dL (ref 1.7–2.4)

## 2016-01-14 MED ORDER — TRACE MINERALS CR-CU-MN-SE-ZN 10-1000-500-60 MCG/ML IV SOLN
INTRAVENOUS | Status: AC
  Administered 2016-01-14: 19:00:00 via INTRAVENOUS
  Filled 2016-01-14: qty 1800

## 2016-01-14 NOTE — Progress Notes (Signed)
PARENTERAL NUTRITION CONSULT NOTE -follow up  Pharmacy Consult for TPN management - electrolytes and glucose Indication: Intractable N/V and SBO,  hx Metastatic colon cancer-  Allergies  Allergen Reactions  . Fentanyl Swelling    Swelling of lips  . Oxaliplatin Itching  . Hydrocodone-Acetaminophen Hives   Labs:  Recent Labs  01/14/16 1055  WBC 13.8*  HGB 8.2*  HCT 24.6*  PLT 323    Recent Labs  01/12/16 0549 01/13/16 0525 01/14/16 0427  NA 143 139 139  K 3.8 4.3 3.7  CL 101 103 103  CO2 32 34* 30  GLUCOSE 142* 189* 166*  BUN 13 15 15   CREATININE 0.49 0.49 0.48  CALCIUM 10.1 9.6 9.5  MG 2.3 2.1 2.0  PHOS 4.7* 4.3 4.5   Estimated Creatinine Clearance: 86.2 mL/min (by C-G formula based on Cr of 0.48).    Recent Labs  01/14/16 0428 01/14/16 0748 01/14/16 1129  GLUCAP 149* 141* 147*    Insulin Requirements in the past 24 hours:  SSI/24hr:  13 units  Current Nutrition:  Clinimix E 5/20% at goal rate of 74ml/hr   Assessment: MD requests supplementation performed through TPN. TPN ordered 3/18 had additional 30 mEq per 2L.    Plan:  Electrolytes WNL today, no supplementation required.    Will continue SSI at moderate scale.    Pharmacy will continue to monitor and adjust per consult.   Rexene Edison, PharmD Clinical Pharmacist  01/14/2016 3:09 PM

## 2016-01-14 NOTE — Progress Notes (Signed)
Per Dr. Bary Castilla, hold radiation this week.  Will address possibly starting radiation back next week when she has further recovered from surgery.

## 2016-01-14 NOTE — Progress Notes (Signed)
Nutrition Follow-up  DOCUMENTATION CODES:   Severe malnutrition in context of chronic illness  INTERVENTION:  PN: Continue TPN of 5%AA/20% dextrose at 35ml/hr. Recommend adding 20% lipids in am.  Pharmacy following electrolytes and blood glucose   NUTRITION DIAGNOSIS:   Malnutrition related to cancer and cancer related treatments, chronic illness as evidenced by energy intake < 75% for > or equal to 1 month, percent weight loss.    GOAL:   Patient will meet greater than or equal to 90% of their needs    MONITOR:   PO intake, Supplement acceptance, Labs, Weight trends, I & O's  REASON FOR ASSESSMENT:   Consult New TPN/TNA  ASSESSMENT:     Noted per surgery planning to clamp tube today.   Current Nutrition: Clear liquids,  tolerating TPN at 2ml/hr   Gastrointestinal Profile: abdomen soft per surgery note Last BM: 3/18   Scheduled Medications:  . famotidine (PEPCID) IV  20 mg Intravenous Q24H  . insulin aspart  0-15 Units Subcutaneous 6 times per day  . LORazepam  0.5 mg Intravenous q morning - 10a  . sodium chloride flush  10-40 mL Intracatheter Q12H    Continuous Medications:  . Marland KitchenTPN (CLINIMIX-E) Adult 75 mL/hr at 01/13/16 1836  . Marland KitchenTPN (CLINIMIX-E) Adult    . dextrose 5 % and 0.9% NaCl 50 mL/hr at 01/13/16 1118     Electrolyte/Renal Profile and Glucose Profile:   Recent Labs Lab 01/12/16 0549 01/13/16 0525 01/14/16 0427  NA 143 139 139  K 3.8 4.3 3.7  CL 101 103 103  CO2 32 34* 30  BUN 13 15 15   CREATININE 0.49 0.49 0.48  CALCIUM 10.1 9.6 9.5  MG 2.3 2.1 2.0  PHOS 4.7* 4.3 4.5  GLUCOSE 142* 189* 166*       Weight Trend since Admission: Filed Weights   01/12/16 0525 01/13/16 0443 01/14/16 0500  Weight: 151 lb 8 oz (68.72 kg) 151 lb 4.8 oz (68.629 kg) 158 lb (71.668 kg)      Diet Order:  Diet clear liquid Room service appropriate?: Yes; Fluid consistency:: Thin .TPN (CLINIMIX-E) Adult .TPN (CLINIMIX-E) Adult  Skin:    reviewed  Height:   Ht Readings from Last 1 Encounters:  12/31/15 5\' 4"  (1.626 m)    Weight:   Wt Readings from Last 1 Encounters:  01/14/16 158 lb (71.668 kg)    Ideal Body Weight:     BMI:  Body mass index is 27.11 kg/(m^2).  Estimated Nutritional Needs:   Kcal:  BEE: 1330kcals, TEE: (IF 1.1-1.3)(AF 1.2) 1755-2054kcals  Protein:  77-91g protein (1.1-1.3g/kg)  Fluid:  1750-2155mL of fluid (25-50mL/kg)  EDUCATION NEEDS:   No education needs identified at this time  HIGH Care Level  Roe Wilner B. Zenia Resides, [place], [place] (pager) Weekend/On-Call pager [phone])

## 2016-01-14 NOTE — Progress Notes (Signed)
Afebrile. Modest hypotension and tachycardia. Excellent urine output speaks against dehydration. Lungs: Clear. ABD: Soft, non-tender. Non-distended. Wound: Clean and dry. GT output reviewed. Minimal bile.  Will clamp tube today and see how she tolerates this maneuver.  Will check CBC today, if < 8 will consider transfusion as she has developed some postural symptoms.

## 2016-01-15 ENCOUNTER — Inpatient Hospital Stay: Payer: 59

## 2016-01-15 ENCOUNTER — Ambulatory Visit: Payer: 59

## 2016-01-15 LAB — BASIC METABOLIC PANEL
Anion gap: 6 (ref 5–15)
BUN: 11 mg/dL (ref 6–20)
CO2: 28 mmol/L (ref 22–32)
Calcium: 8.5 mg/dL — ABNORMAL LOW (ref 8.9–10.3)
Chloride: 97 mmol/L — ABNORMAL LOW (ref 101–111)
Creatinine, Ser: 0.51 mg/dL (ref 0.44–1.00)
GFR calc Af Amer: >60 mL/min (ref >60)
GLUCOSE: 172 mg/dL — AB (ref 65–99)
POTASSIUM: 3.3 mmol/L — AB (ref 3.5–5.1)
Sodium: 131 mmol/L — ABNORMAL LOW (ref 135–145)

## 2016-01-15 LAB — GLUCOSE, CAPILLARY
GLUCOSE-CAPILLARY: 134 mg/dL — AB (ref 65–99)
GLUCOSE-CAPILLARY: 150 mg/dL — AB (ref 65–99)
GLUCOSE-CAPILLARY: 156 mg/dL — AB (ref 65–99)
Glucose-Capillary: 187 mg/dL — ABNORMAL HIGH (ref 65–99)
Glucose-Capillary: 152 mg/dL — ABNORMAL HIGH (ref 65–99)
Glucose-Capillary: 163 mg/dL — ABNORMAL HIGH (ref 65–99)

## 2016-01-15 MED ORDER — HYDROMORPHONE HCL 2 MG PO TABS
1.0000 mg | ORAL_TABLET | ORAL | Status: DC | PRN
  Administered 2016-01-15 – 2016-01-18 (×12): 1 mg via ORAL
  Filled 2016-01-15 (×12): qty 1

## 2016-01-15 MED ORDER — TRACE MINERALS CR-CU-MN-SE-ZN 10-1000-500-60 MCG/ML IV SOLN
INTRAVENOUS | Status: AC
  Administered 2016-01-15: 19:00:00 via INTRAVENOUS
  Filled 2016-01-15: qty 1800

## 2016-01-15 MED ORDER — HYDROMORPHONE HCL 2 MG PO TABS
2.0000 mg | ORAL_TABLET | Freq: Three times a day (TID) | ORAL | Status: DC
  Administered 2016-01-15 – 2016-01-17 (×6): 2 mg via ORAL
  Filled 2016-01-15 (×6): qty 1

## 2016-01-15 MED ORDER — HYDROMORPHONE HCL 1 MG/ML PO LIQD: 1.0000 mg | ORAL | Status: DC | PRN

## 2016-01-15 MED ORDER — ONDANSETRON 4 MG PO TBDP
4.0000 mg | ORAL_TABLET | ORAL | Status: DC | PRN
  Administered 2016-01-15 – 2016-01-16 (×2): 4 mg via ORAL
  Filled 2016-01-15 (×3): qty 1

## 2016-01-15 MED ORDER — ACETAMINOPHEN 325 MG PO TABS
650.0000 mg | ORAL_TABLET | ORAL | Status: DC
  Administered 2016-01-15 – 2016-01-18 (×16): 650 mg via ORAL
  Filled 2016-01-15 (×16): qty 2

## 2016-01-15 MED ORDER — MAGNESIUM HYDROXIDE 400 MG/5ML PO SUSP
60.0000 mL | Freq: Once | ORAL | Status: AC
  Administered 2016-01-15: 60 mL
  Filled 2016-01-15: qty 60

## 2016-01-15 MED ORDER — POTASSIUM CHLORIDE 20 MEQ PO PACK
20.0000 meq | PACK | Freq: Two times a day (BID) | ORAL | Status: DC
  Administered 2016-01-15 – 2016-01-18 (×7): 20 meq via ORAL
  Filled 2016-01-15 (×7): qty 1

## 2016-01-15 MED ORDER — PANTOPRAZOLE SODIUM 40 MG PO TBEC
40.0000 mg | DELAYED_RELEASE_TABLET | Freq: Two times a day (BID) | ORAL | Status: DC
  Administered 2016-01-15 – 2016-01-18 (×7): 40 mg via ORAL
  Filled 2016-01-15 (×7): qty 1

## 2016-01-15 NOTE — Progress Notes (Signed)
Afebrile, blood pressure up, mild tachycardia persists.  One episode of mild nausea, no vomiting. Small amount of gas per rectum.  Lungs: Clear.  Abdomen: Minimal distention, soft, nontender.  Midline wound: Clean.  Extremities: Soft.  Laboratory: Potassium has once again drifted down with discontinuation of supplementation and TPN.  Blood sugars baseline 125-175. On sliding scale insulin.  Plain films: Mild gastric distention with small air-fluid level, minimal small bowel gas. Contrast persists from UGI/SBFT 8 days ago.  Plan maintenance pain medications with when necessary for breakthrough. Milk of magnesia. Change to oral meds. Encouraged ambulation and seated position or standing to improve emptying from gastrojejunostomy.

## 2016-01-15 NOTE — Plan of Care (Signed)
Problem: Nutritional: Goal: Maintenance of adequate nutrition will improve Outcome: Progressing Pt continues clear liquids. G-tube clamped and pt with complaints of mild nausea. Antiemetic given with relief noted.

## 2016-01-15 NOTE — Progress Notes (Signed)
PARENTERAL NUTRITION CONSULT NOTE -follow up  Pharmacy Consult for TPN management - electrolytes and glucose Indication: Intractable N/V and SBO,  hx Metastatic colon cancer-  Allergies  Allergen Reactions  . Fentanyl Swelling    Swelling of lips  . Oxaliplatin Itching  . Hydrocodone-Acetaminophen Hives   Labs:  Recent Labs  01/14/16 1055  WBC 13.8*  HGB 8.2*  HCT 24.6*  PLT 323    Recent Labs  01/13/16 0525 01/14/16 0427 01/15/16 0407  NA 139 139 131*  K 4.3 3.7 3.3*  CL 103 103 97*  CO2 34* 30 28  GLUCOSE 189* 166* 172*  BUN 15 15 11   CREATININE 0.49 0.48 0.51  CALCIUM 9.6 9.5 8.5*  MG 2.1 2.0  --   PHOS 4.3 4.5  --    Estimated Creatinine Clearance: 85.9 mL/min (by C-G formula based on Cr of 0.51).    Recent Labs  01/15/16 0735 01/15/16 1114 01/15/16 1619  GLUCAP 152* 163* 150*    Insulin Requirements in the past 24 hours:  SSI/24hr:  13 units  Current Nutrition:  Clinimix E 5/20% at goal rate of 30ml/hr   Assessment: K is slighlty low at 3.3. MD has ordered KCL 101mEq po BID.   Plan:  Will continue with current TPN orders.    Will continue SSI at moderate scale.    Pharmacy will continue to monitor and adjust per consult.   Rexene Edison, PharmD Clinical Pharmacist  01/15/2016 4:45 PM

## 2016-01-16 ENCOUNTER — Ambulatory Visit: Payer: 59

## 2016-01-16 LAB — BASIC METABOLIC PANEL
Anion gap: 7 (ref 5–15)
BUN: 12 mg/dL (ref 6–20)
CHLORIDE: 104 mmol/L (ref 101–111)
CO2: 25 mmol/L (ref 22–32)
Calcium: 8.9 mg/dL (ref 8.9–10.3)
Creatinine, Ser: 0.46 mg/dL (ref 0.44–1.00)
GFR calc Af Amer: >60 mL/min (ref >60)
GLUCOSE: 148 mg/dL — AB (ref 65–99)
POTASSIUM: 3.7 mmol/L (ref 3.5–5.1)
Sodium: 136 mmol/L (ref 135–145)

## 2016-01-16 LAB — GLUCOSE, CAPILLARY
GLUCOSE-CAPILLARY: 114 mg/dL — AB (ref 65–99)
Glucose-Capillary: 124 mg/dL — ABNORMAL HIGH (ref 65–99)
Glucose-Capillary: 136 mg/dL — ABNORMAL HIGH (ref 65–99)
Glucose-Capillary: 146 mg/dL — ABNORMAL HIGH (ref 65–99)
Glucose-Capillary: 149 mg/dL — ABNORMAL HIGH (ref 65–99)
Glucose-Capillary: 153 mg/dL — ABNORMAL HIGH (ref 65–99)

## 2016-01-16 MED ORDER — BOOST / RESOURCE BREEZE PO LIQD
1.0000 | Freq: Two times a day (BID) | ORAL | Status: DC
  Administered 2016-01-16 – 2016-01-18 (×4): 1 via ORAL

## 2016-01-16 MED ORDER — TRACE MINERALS CR-CU-MN-SE-ZN 10-1000-500-60 MCG/ML IV SOLN
INTRAVENOUS | Status: AC
  Administered 2016-01-16: 19:00:00 via INTRAVENOUS
  Filled 2016-01-16: qty 1800

## 2016-01-16 NOTE — Progress Notes (Signed)
PARENTERAL NUTRITION CONSULT NOTE -follow up  Pharmacy Consult for TPN management - electrolytes and glucose Indication: Intractable N/V and SBO,  hx Metastatic colon cancer-  Allergies  Allergen Reactions  . Fentanyl Swelling    Swelling of lips  . Oxaliplatin Itching  . Hydrocodone-Acetaminophen Hives   Labs:  Recent Labs  01/14/16 1055  WBC 13.8*  HGB 8.2*  HCT 24.6*  PLT 323    Recent Labs  01/14/16 0427 01/15/16 0407 01/16/16 0527  NA 139 131* 136  K 3.7 3.3* 3.7  CL 103 97* 104  CO2 30 28 25   GLUCOSE 166* 172* 148*  BUN 15 11 12   CREATININE 0.48 0.51 0.46  CALCIUM 9.5 8.5* 8.9  MG 2.0  --   --   PHOS 4.5  --   --    Estimated Creatinine Clearance: 86.5 mL/min (by C-G formula based on Cr of 0.46).    Recent Labs  01/16/16 0355 01/16/16 0750 01/16/16 1100  GLUCAP 124* 153* 136*    Insulin Requirements in the past 24 hours:  SSI/24hr:  9 units  Current Nutrition:  Clinimix E 5/20% at goal rate of 26ml/hr   Assessment: Electrolytes WNL, current orders for KCl 25mEq PO BID, no additional supplementation required at this time  Plan:  Will continue with current TPN orders.    Will continue SSI at moderate scale.    Pharmacy will continue to monitor and adjust per consult.   Rexene Edison, PharmD Clinical Pharmacist  01/16/2016 1:45 PM

## 2016-01-16 NOTE — Progress Notes (Signed)
Afebrile, BP stable, still mild tachycardia. No symptoms. Ambulating well.  No nausea yesterday. Tolerating liquids better.  Chest: Clear.  Abdomen: Mild distention. Good bowel sounds. Positive bowel movement after yesterday's milk of magnesia.  Extremities: Soft. No evidence of DVT.  Discussed with dietitian. We'll advance to soft solids and hold weaning TPN until tomorrow. Hopeful for discharge in 48-72 hours.

## 2016-01-16 NOTE — Progress Notes (Signed)
Nutrition Follow-up  DOCUMENTATION CODES:   Severe malnutrition in context of chronic illness  INTERVENTION:   Coordination of Care: spoke with MD, Byrnett regarding nutrition poc. MD wanting pt to try a Low Residue Diet order with supplements as pt likes. MD wishing to continue TPN at this time and monitor po intake of solid foods, hopeful to wean from TPN within 24-48 hours. PN: Recommend continuing 5%AA/20%Dextrose at current goal rate of 80mL/hr. Will continue to follow and assess.  Meals and Snacks: Cater to patient preferences on Soft/Low Residue diet order. Medical Food Supplement Therapy: will recommend Boost Breeze po BID, each supplement provides 250 kcal and 9 grams of protein, as pt was drinking and tolerating at the beginning of her admission. Will also send [employer] on breakfast trays as pt likes.   NUTRITION DIAGNOSIS:   Malnutrition related to cancer and cancer related treatments, chronic illness as evidenced by energy intake < 75% for > or equal to 1 month, percent weight loss.  GOAL:   Patient will meet greater than or equal to 90% of their needs  MONITOR:   PO intake, Supplement acceptance, Labs, Weight trends, I & O's  REASON FOR ASSESSMENT:   Consult New TPN/TNA  ASSESSMENT:   Pt admitted with intractible n/v with hypokalemia. Pt with h/o colon cancer on palliative chemoradiation.  Pt ambulating in hallways this am on visit.   Diet Order:  .TPN (CLINIMIX-E) Adult DIET SOFT Room service appropriate?: Yes; Fluid consistency:: Thin .TPN (CLINIMIX-E) Adult    Current Nutrition: Pt tolerating CL at this time   Gastrointestinal Profile: Last BM: 01/16/2016 +BM times 2   Scheduled Medications:  . acetaminophen  650 mg Oral Q4H while awake  . feeding supplement  1 Container Oral BID WC  . HYDROmorphone  2 mg Oral 3 times per day  . insulin aspart  0-15 Units Subcutaneous 6 times per day  . LORazepam  0.5 mg Intravenous q morning -  10a  . pantoprazole  40 mg Oral BID  . potassium chloride  20 mEq Oral BID  . sodium chloride flush  10-40 mL Intracatheter Q12H    Continuous Medications:  . Marland KitchenTPN (CLINIMIX-E) Adult 75 mL/hr at 01/15/16 1830  . Marland KitchenTPN (CLINIMIX-E) Adult       Electrolyte/Renal Profile and Glucose Profile:   Recent Labs Lab 01/12/16 0549 01/13/16 0525 01/14/16 0427 01/15/16 0407 01/16/16 0527  NA 143 139 139 131* 136  K 3.8 4.3 3.7 3.3* 3.7  CL 101 103 103 97* 104  CO2 32 34* 30 28 25   BUN 13 15 15 11 12   CREATININE 0.49 0.49 0.48 0.51 0.46  CALCIUM 10.1 9.6 9.5 8.5* 8.9  MG 2.3 2.1 2.0  --   --   PHOS 4.7* 4.3 4.5  --   --   GLUCOSE 142* 189* 166* 172* 148*   Protein Profile: No results for input(s): ALBUMIN in the last 168 hours.   Weight Trend since Admission: Filed Weights   01/14/16 0500 01/15/16 0431 01/16/16 0500  Weight: 158 lb (71.668 kg) 157 lb 3.2 oz (71.305 kg) 159 lb (72.122 kg)     BMI:  Body mass index is 27.28 kg/(m^2).  Estimated Nutritional Needs:   Kcal:  BEE: 1330kcals, TEE: (IF 1.1-1.3)(AF 1.2) 1755-2054kcals  Protein:  77-91g protein (1.1-1.3g/kg)  Fluid:  1750-2110mL of fluid (25-54mL/kg)  EDUCATION NEEDS:   No education needs identified at this time   [place], RD, LDN Pager [phone])  [id number] Weekend/On-Call Pager [phone]

## 2016-01-17 ENCOUNTER — Ambulatory Visit: Payer: 59

## 2016-01-17 LAB — BASIC METABOLIC PANEL
ANION GAP: 6 (ref 5–15)
BUN: 12 mg/dL (ref 6–20)
CO2: 25 mmol/L (ref 22–32)
Calcium: 8.6 mg/dL — ABNORMAL LOW (ref 8.9–10.3)
Chloride: 104 mmol/L (ref 101–111)
Creatinine, Ser: 0.49 mg/dL (ref 0.44–1.00)
GFR calc Af Amer: >60 mL/min (ref >60)
GLUCOSE: 155 mg/dL — AB (ref 65–99)
POTASSIUM: 3.6 mmol/L (ref 3.5–5.1)
Sodium: 135 mmol/L (ref 135–145)

## 2016-01-17 LAB — PHOSPHORUS: Phosphorus: 3.1 mg/dL (ref 2.5–4.6)

## 2016-01-17 LAB — GLUCOSE, CAPILLARY
GLUCOSE-CAPILLARY: 151 mg/dL — AB (ref 65–99)
GLUCOSE-CAPILLARY: 169 mg/dL — AB (ref 65–99)
GLUCOSE-CAPILLARY: 151 mg/dL — AB (ref 65–99)
Glucose-Capillary: 155 mg/dL — ABNORMAL HIGH (ref 65–99)
Glucose-Capillary: 169 mg/dL — ABNORMAL HIGH (ref 65–99)

## 2016-01-17 LAB — CBC WITH DIFFERENTIAL/PLATELET
Basophils Absolute: 0 10*3/uL (ref 0–0.1)
Basophils Relative: 0 %
EOS PCT: 2 %
Eosinophils Absolute: 0.3 10*3/uL (ref 0–0.7)
HEMATOCRIT: 23.4 % — AB (ref 35.0–47.0)
Hemoglobin: 7.9 g/dL — ABNORMAL LOW (ref 12.0–16.0)
LYMPHS ABS: 0.6 10*3/uL — AB (ref 1.0–3.6)
LYMPHS PCT: 4 %
MCH: 30.1 pg (ref 26.0–34.0)
MCHC: 33.8 g/dL (ref 32.0–36.0)
MCV: 88.9 fL (ref 80.0–100.0)
MONO ABS: 1.1 10*3/uL — AB (ref 0.2–0.9)
Monocytes Relative: 8 %
NEUTROS ABS: 12.2 10*3/uL — AB (ref 1.4–6.5)
Neutrophils Relative %: 86 %
PLATELETS: 341 10*3/uL (ref 150–440)
RBC: 2.63 MIL/uL — AB (ref 3.80–5.20)
RDW: 21.1 % — ABNORMAL HIGH (ref 11.5–14.5)
WBC: 14.2 10*3/uL — AB (ref 3.6–11.0)

## 2016-01-17 LAB — MAGNESIUM: Magnesium: 1.9 mg/dL (ref 1.7–2.4)

## 2016-01-17 MED ORDER — M.V.I. ADULT IV INJ
INJECTION | INTRAVENOUS | Status: DC
  Administered 2016-01-17: 19:00:00 via INTRAVENOUS
  Filled 2016-01-17: qty 960

## 2016-01-17 MED ORDER — HYDROMORPHONE HCL 2 MG PO TABS
2.0000 mg | ORAL_TABLET | Freq: Four times a day (QID) | ORAL | Status: DC
  Administered 2016-01-17 – 2016-01-18 (×6): 2 mg via ORAL
  Filled 2016-01-17 (×6): qty 1

## 2016-01-17 NOTE — Progress Notes (Signed)
PARENTERAL NUTRITION CONSULT NOTE -follow up  Pharmacy Consult for TPN management - electrolytes and glucose Indication: Intractable N/V and SBO,  hx Metastatic colon cancer-  Allergies  Allergen Reactions  . Fentanyl Swelling    Swelling of lips  . Oxaliplatin Itching  . Hydrocodone-Acetaminophen Hives   Labs:  Recent Labs  01/17/16 0418  WBC 14.2*  HGB 7.9*  HCT 23.4*  PLT 341    Recent Labs  01/15/16 0407 01/16/16 0527 01/17/16 0418  NA 131* 136 135  K 3.3* 3.7 3.6  CL 97* 104 104  CO2 28 25 25   GLUCOSE 172* 148* 155*  BUN 11 12 12   CREATININE 0.51 0.46 0.49  CALCIUM 8.5* 8.9 8.6*  MG  --   --  1.9  PHOS  --   --  3.1   Estimated Creatinine Clearance: 85 mL/min (by C-G formula based on Cr of 0.49).    Recent Labs  01/17/16 0406 01/17/16 0735 01/17/16 1146  GLUCAP 151* 169* 155*    Insulin Requirements in the past 24 hours:  SSI/24hr:  13units  Current Nutrition:  Clinimix E 5/20% at goal rate of 98ml/hr, planning to decrease to 82ml/hr this evening, pt with diet orders.  Assessment: Electrolytes WNL, current orders for KCl 30mEq PO BID, no additional supplementation required at this time  Plan:  Will continue with current TPN orders.    Will continue SSI at moderate scale.    Pharmacy will continue to monitor and adjust per consult.   Rexene Edison, PharmD Clinical Pharmacist  01/17/2016 1:19 PM

## 2016-01-17 NOTE — Progress Notes (Signed)
Afebrile, mild persistent tachycardia. No clinical symptoms with ambulation. She is ambulating frequently as requested.  He tolerated advancement to a soft diet, eating small volumes without discomfort. No nausea or vomiting. More normal stool yesterday. Pain is primarily in the back, likely from the mass adjacent to the pancreas. No incisional pain.  No shortness of breath or chest pain.  Chest: Clear.  Cardiac: Regular rhythm, tachycardia.  Abdomen: Minimal distention, good bowel sounds, soft, nontender. Epigastric distention noted prior to surgery is no longer evident.  Small amount of drainage around the gastrostomy site.  Extremities: Soft.  Laboratory studies: Hemoglobin 7.9, white blood cell count 14,000. Minimal change. White blood cell likely related to the tumor mass. No clinical evidence of infection.  Case reviewed with Dr. Oliva Bustard.  We will plan on weaning her TPN over the next 36 hours. Possible candidate for discharge tomorrow if her oral intake is adequate.

## 2016-01-17 NOTE — Progress Notes (Signed)
Nutrition Follow-up  DOCUMENTATION CODES:   Severe malnutrition in context of chronic illness  INTERVENTION:   PN: per discussion with MD Byrnett yesterday and MD note today, will recommend starting to wean TPN over the next 36 hours. As TPN orders are ordered over 24 hour period starting at 18:00, will recommending decreasing rate of TPN to 76mL/hr starting at 18:00 today (9108mL over 24 hours) with plans to discontinue TPN tomorrow as medically able. Will continue to follow and assess.  Meals and Snacks: Cater to patient preferences Medical Food Supplement Therapy: Continue [employer] Breakfast as pt drinks and likes, and continue Boost Breeze as pt has been sipping and tolerating. Informed pt where these supplements are available outpatient as well.    NUTRITION DIAGNOSIS:   Malnutrition related to cancer and cancer related treatments, chronic illness as evidenced by energy intake < 75% for > or equal to 1 month, percent weight loss.  GOAL:   Patient will meet greater than or equal to 90% of their needs  MONITOR:   PO intake, Supplement acceptance, Labs, Weight trends, I & O's  REASON FOR ASSESSMENT:   Consult New TPN/TNA  ASSESSMENT:   Pt admitted with intractible n/v with hypokalemia. Pt with h/o colon cancer on palliative chemoradiation.  Pt tolerating small amounts of soft foods without n/v. Pt has been ambulating and sitting up in bed as well.   Diet Order:  DIET SOFT Room service appropriate?: Yes; Fluid consistency:: Thin .TPN (CLINIMIX-E) Adult .TPN (CLINIMIX-E) Adult   PN: 5%AA/20% Dextrose at 58mL/hr  Current Nutrition: pt reports drinking [employer] Breakfast this am and tolerating very well. Pt sipping on Boost Breeze at bedside on visit and reports eating bites of mashed potatoes for lunch. Pt reports eating mashed potatoes and smashed carrots last night and tolerated very well.  40-45% of meals documented yesterday, 20% this am per I/O  chart.   Gastrointestinal Profile: Last BM: 01/16/2016   Scheduled Medications:  . acetaminophen  650 mg Oral Q4H while awake  . feeding supplement  1 Container Oral BID WC  . HYDROmorphone  2 mg Oral QID  . insulin aspart  0-15 Units Subcutaneous 6 times per day  . LORazepam  0.5 mg Intravenous q morning - 10a  . pantoprazole  40 mg Oral BID  . potassium chloride  20 mEq Oral BID  . sodium chloride flush  10-40 mL Intracatheter Q12H    Continuous Medications:  . Marland KitchenTPN (CLINIMIX-E) Adult 75 mL/hr at 01/16/16 1849  . Marland KitchenTPN (CLINIMIX-E) Adult       Electrolyte/Renal Profile and Glucose Profile:   Recent Labs Lab 01/13/16 0525 01/14/16 0427 01/15/16 0407 01/16/16 0527 01/17/16 0418  NA 139 139 131* 136 135  K 4.3 3.7 3.3* 3.7 3.6  CL 103 103 97* 104 104  CO2 34* 30 28 25 25   BUN 15 15 11 12 12   CREATININE 0.49 0.48 0.51 0.46 0.49  CALCIUM 9.6 9.5 8.5* 8.9 8.6*  MG 2.1 2.0  --   --  1.9  PHOS 4.3 4.5  --   --  3.1  GLUCOSE 189* 166* 172* 148* 155*   Protein Profile: No results for input(s): ALBUMIN in the last 168 hours.   Weight Trend since Admission: Filed Weights   01/15/16 0431 01/16/16 0500 01/17/16 0409  Weight: 157 lb 3.2 oz (71.305 kg) 159 lb (72.122 kg) 153 lb 3.2 oz (69.491 kg)    BMI:  Body mass index is 26.28 kg/(m^2).  Estimated Nutritional  Needs:   Kcal:  BEE: 1330kcals, TEE: (IF 1.1-1.3)(AF 1.2) 1755-2054kcals  Protein:  77-91g protein (1.1-1.3g/kg)  Fluid:  1750-2156mL of fluid (25-79mL/kg)  EDUCATION NEEDS:   No education needs identified at this time  [place], RD, LDN Pager [phone] Weekend/On-Call Pager [phone]

## 2016-01-18 ENCOUNTER — Ambulatory Visit: Payer: 59

## 2016-01-18 ENCOUNTER — Other Ambulatory Visit: Payer: Self-pay | Admitting: *Deleted

## 2016-01-18 DIAGNOSIS — C189 Malignant neoplasm of colon, unspecified: Secondary | ICD-10-CM

## 2016-01-18 LAB — BASIC METABOLIC PANEL
Anion gap: 5 (ref 5–15)
BUN: 11 mg/dL (ref 6–20)
CHLORIDE: 104 mmol/L (ref 101–111)
CO2: 26 mmol/L (ref 22–32)
CREATININE: 0.46 mg/dL (ref 0.44–1.00)
Calcium: 8.5 mg/dL — ABNORMAL LOW (ref 8.9–10.3)
Glucose, Bld: 128 mg/dL — ABNORMAL HIGH (ref 65–99)
POTASSIUM: 3.7 mmol/L (ref 3.5–5.1)
SODIUM: 135 mmol/L (ref 135–145)

## 2016-01-18 LAB — GLUCOSE, CAPILLARY
GLUCOSE-CAPILLARY: 119 mg/dL — AB (ref 65–99)
GLUCOSE-CAPILLARY: 125 mg/dL — AB (ref 65–99)
GLUCOSE-CAPILLARY: 99 mg/dL (ref 65–99)
Glucose-Capillary: 122 mg/dL — ABNORMAL HIGH (ref 65–99)
Glucose-Capillary: 130 mg/dL — ABNORMAL HIGH (ref 65–99)
Glucose-Capillary: 152 mg/dL — ABNORMAL HIGH (ref 65–99)

## 2016-01-18 MED ORDER — ONDANSETRON 4 MG PO TBDP: 4.0000 mg | ORAL_TABLET | ORAL | Status: DC | PRN

## 2016-01-18 MED ORDER — HYDROMORPHONE HCL 2 MG PO TABS: ORAL_TABLET | ORAL | Status: DC

## 2016-01-18 MED ORDER — SODIUM CHLORIDE 0.9% FLUSH: 10.0000 mL | INTRAVENOUS | Status: DC | PRN

## 2016-01-18 MED ORDER — HEPARIN SOD (PORK) LOCK FLUSH 100 UNIT/ML IV SOLN
500.0000 [IU] | INTRAVENOUS | Status: DC | PRN
  Filled 2016-01-18: qty 5

## 2016-01-18 MED ORDER — PANTOPRAZOLE SODIUM 40 MG PO TBEC: 40.0000 mg | DELAYED_RELEASE_TABLET | Freq: Every day | ORAL | Status: DC

## 2016-01-18 MED ORDER — ACETAMINOPHEN 325 MG PO TABS: 650.0000 mg | ORAL_TABLET | ORAL | Status: AC

## 2016-01-18 NOTE — Progress Notes (Signed)
PARENTERAL NUTRITION CONSULT NOTE -follow up  Pharmacy Consult for TPN management - electrolytes and glucose Indication: Intractable N/V and SBO,  hx Metastatic colon cancer-  Allergies  Allergen Reactions  . Fentanyl Swelling    Swelling of lips  . Oxaliplatin Itching  . Hydrocodone-Acetaminophen Hives   Labs:  Recent Labs  01/17/16 0418  WBC 14.2*  HGB 7.9*  HCT 23.4*  PLT 341    Recent Labs  01/16/16 0527 01/17/16 0418 01/18/16 0419  NA 136 135 135  K 3.7 3.6 3.7  CL 104 104 104  CO2 25 25 26   GLUCOSE 148* 155* 128*  BUN 12 12 11   CREATININE 0.46 0.49 0.46  CALCIUM 8.9 8.6* 8.5*  MG  --  1.9  --   PHOS  --  3.1  --    Estimated Creatinine Clearance: 85.1 mL/min (by C-G formula based on Cr of 0.46).    Recent Labs  01/18/16 0733 01/18/16 0906 01/18/16 1127  GLUCAP 152* 130* 125*    Insulin Requirements in the past 24 hours:  SSI/24hr:  15 units  Current Nutrition:  TPN off, diet ordered.   Assessment: Patient is now on soft diet and TPN is off.   Plan:  Will sign off. Thank you for the consult.   Ulice Dash, PharmD Clinical Pharmacist  01/18/2016 12:48 PM

## 2016-01-18 NOTE — Progress Notes (Signed)
Pt being discharged, discharge and prescription reviewed with pt and family, states understanding, pt with no noted complaints at discharge, no distress or discomfort noted at discharge

## 2016-01-18 NOTE — Final Progress Note (Signed)
BS 2 hours after TPN 99. Reviewed caloric needs. 1 quart (minimum) po intake. Sodas ok if carbonation stirred out. Dilaudid/ Tylenol: standing scheduled to maintain good pain control reviewed.

## 2016-01-18 NOTE — Progress Notes (Signed)
Afebrile. BP stable. Mild tachycardia persists. Tolerated diet advance fair.  Ambulating well. No nausea or vomiting. Passing gas, no BM yesterday. Lungs: Clear. Cardio: RR, rate 110. ABD: Mild distension, soft, non-tender. BS+. Wound: Clean. Discussed w/ Dr. Oliva Bustard. Will d/c TPN and recheck BS 2 hours later to be sure she does not develop hypoglycemia. (No diabetic issues prior to admission.)  For pain, Dr. Oliva Bustard recommended Dilaudid, 2 mg 5 x/ day and prn.  Will provide RX for same. Outpatient f/u w/ Dr Metro Kung associate in process while he is away.  Will arrange for f/u in my office April 4 at 1 PM.

## 2016-01-21 ENCOUNTER — Ambulatory Visit: Payer: 59

## 2016-01-22 ENCOUNTER — Ambulatory Visit: Payer: 59

## 2016-01-23 ENCOUNTER — Ambulatory Visit: Payer: 59

## 2016-01-24 ENCOUNTER — Ambulatory Visit: Payer: 59

## 2016-01-25 ENCOUNTER — Ambulatory Visit: Payer: 59

## 2016-01-25 ENCOUNTER — Inpatient Hospital Stay (HOSPITAL_BASED_OUTPATIENT_CLINIC_OR_DEPARTMENT_OTHER): Payer: 59 | Admitting: Internal Medicine

## 2016-01-25 ENCOUNTER — Inpatient Hospital Stay: Payer: 59

## 2016-01-25 VITALS — BP 97/68 | HR 120 | Temp 98.0°F | Resp 18 | Wt 137.8 lb

## 2016-01-25 DIAGNOSIS — R630 Anorexia: Secondary | ICD-10-CM

## 2016-01-25 DIAGNOSIS — R110 Nausea: Secondary | ICD-10-CM | POA: Diagnosis not present

## 2016-01-25 DIAGNOSIS — E860 Dehydration: Secondary | ICD-10-CM | POA: Diagnosis not present

## 2016-01-25 DIAGNOSIS — M545 Low back pain: Secondary | ICD-10-CM | POA: Diagnosis not present

## 2016-01-25 DIAGNOSIS — C189 Malignant neoplasm of colon, unspecified: Secondary | ICD-10-CM

## 2016-01-25 DIAGNOSIS — C787 Secondary malignant neoplasm of liver and intrahepatic bile duct: Secondary | ICD-10-CM

## 2016-01-25 DIAGNOSIS — C185 Malignant neoplasm of splenic flexure: Secondary | ICD-10-CM | POA: Diagnosis present

## 2016-01-25 DIAGNOSIS — Z9889 Other specified postprocedural states: Secondary | ICD-10-CM

## 2016-01-25 DIAGNOSIS — R5383 Other fatigue: Secondary | ICD-10-CM | POA: Diagnosis not present

## 2016-01-25 DIAGNOSIS — M542 Cervicalgia: Secondary | ICD-10-CM | POA: Diagnosis not present

## 2016-01-25 DIAGNOSIS — D72829 Elevated white blood cell count, unspecified: Secondary | ICD-10-CM | POA: Diagnosis not present

## 2016-01-25 DIAGNOSIS — R000 Tachycardia, unspecified: Secondary | ICD-10-CM

## 2016-01-25 DIAGNOSIS — R197 Diarrhea, unspecified: Secondary | ICD-10-CM | POA: Diagnosis not present

## 2016-01-25 DIAGNOSIS — Z5111 Encounter for antineoplastic chemotherapy: Secondary | ICD-10-CM | POA: Diagnosis not present

## 2016-01-25 DIAGNOSIS — Z923 Personal history of irradiation: Secondary | ICD-10-CM | POA: Diagnosis not present

## 2016-01-25 DIAGNOSIS — Z888 Allergy status to other drugs, medicaments and biological substances status: Secondary | ICD-10-CM | POA: Diagnosis not present

## 2016-01-25 DIAGNOSIS — E876 Hypokalemia: Secondary | ICD-10-CM | POA: Diagnosis not present

## 2016-01-25 DIAGNOSIS — R112 Nausea with vomiting, unspecified: Secondary | ICD-10-CM

## 2016-01-25 DIAGNOSIS — Z9221 Personal history of antineoplastic chemotherapy: Secondary | ICD-10-CM | POA: Diagnosis not present

## 2016-01-25 DIAGNOSIS — Z931 Gastrostomy status: Secondary | ICD-10-CM

## 2016-01-25 DIAGNOSIS — Z79899 Other long term (current) drug therapy: Secondary | ICD-10-CM | POA: Diagnosis not present

## 2016-01-25 LAB — COMPREHENSIVE METABOLIC PANEL
ALK PHOS: 109 U/L (ref 38–126)
ALT: 19 U/L (ref 14–54)
ANION GAP: 7 (ref 5–15)
AST: 21 U/L (ref 15–41)
Albumin: 2.3 g/dL — ABNORMAL LOW (ref 3.5–5.0)
BUN: 9 mg/dL (ref 6–20)
CALCIUM: 8.2 mg/dL — AB (ref 8.9–10.3)
CO2: 26 mmol/L (ref 22–32)
CREATININE: 0.56 mg/dL (ref 0.44–1.00)
Chloride: 96 mmol/L — ABNORMAL LOW (ref 101–111)
GFR calc Af Amer: >60 mL/min (ref >60)
Glucose, Bld: 131 mg/dL — ABNORMAL HIGH (ref 65–99)
Potassium: 3 mmol/L — ABNORMAL LOW (ref 3.5–5.1)
SODIUM: 129 mmol/L — AB (ref 135–145)
Total Bilirubin: 0.5 mg/dL (ref 0.3–1.2)
Total Protein: 7.1 g/dL (ref 6.5–8.1)

## 2016-01-25 LAB — CBC WITH DIFFERENTIAL/PLATELET
Basophils Absolute: 0 10*3/uL (ref 0–0.1)
Basophils Relative: 0 %
EOS ABS: 0.1 10*3/uL (ref 0–0.7)
EOS PCT: 1 %
HCT: 26.2 % — ABNORMAL LOW (ref 35.0–47.0)
HEMOGLOBIN: 9 g/dL — AB (ref 12.0–16.0)
LYMPHS ABS: 1.3 10*3/uL (ref 1.0–3.6)
LYMPHS PCT: 7 %
MCH: 28.5 pg (ref 26.0–34.0)
MCHC: 34.5 g/dL (ref 32.0–36.0)
MCV: 82.7 fL (ref 80.0–100.0)
MONOS PCT: 8 %
Monocytes Absolute: 1.3 10*3/uL — ABNORMAL HIGH (ref 0.2–0.9)
Neutro Abs: 14.2 10*3/uL — ABNORMAL HIGH (ref 1.4–6.5)
Neutrophils Relative %: 84 %
PLATELETS: 675 10*3/uL — AB (ref 150–440)
RBC: 3.16 MIL/uL — ABNORMAL LOW (ref 3.80–5.20)
RDW: 22.6 % — ABNORMAL HIGH (ref 11.5–14.5)
WBC: 16.9 10*3/uL — ABNORMAL HIGH (ref 3.6–11.0)

## 2016-01-25 MED ORDER — MEGESTROL ACETATE 400 MG/10ML PO SUSP: 400.0000 mg | Freq: Two times a day (BID) | ORAL | Status: AC

## 2016-01-25 MED ORDER — SODIUM CHLORIDE 0.9 % IJ SOLN
10.0000 mL | INTRAMUSCULAR | Status: DC | PRN
  Administered 2016-01-25: 10 mL
  Filled 2016-01-25: qty 10

## 2016-01-25 MED ORDER — HEPARIN SOD (PORK) LOCK FLUSH 100 UNIT/ML IV SOLN
500.0000 [IU] | Freq: Once | INTRAVENOUS | Status: AC | PRN
  Administered 2016-01-25: 500 [IU]
  Filled 2016-01-25: qty 5

## 2016-01-25 MED ORDER — SODIUM CHLORIDE 0.9 % IV SOLN
Freq: Once | INTRAVENOUS | Status: AC
  Administered 2016-01-25: 16:00:00 via INTRAVENOUS
  Filled 2016-01-25: qty 1000

## 2016-01-25 NOTE — Discharge Summary (Signed)
Physician Discharge Summary  Patient ID: Misty Mack MRN: [id number] DOB/AGE: Oct 15, 1971 45 y.o.  Admit date: 12/31/2015 Discharge date: 01/25/2016  Admission Diagnoses:  Nausea, vomiting, weight loss, past history colon cancer.  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 Active Problems:   Colon cancer ([place])   Protein-calorie malnutrition, severe   SBO (small bowel obstruction) ([place])   Discharged Condition: Improved  Hospital Course: The patient presented with a three-week history of poor by mouth intake and 2 week history of nausea and vomiting after all meals. Plain films were notable for massive distention of the stomach. She underwent nasogastric decompression and a UGI/SBFT study suggested partial impingement on the medial wall of the stomach near the incisura as well as obstruction of flow at the fourth portion of the duodenum. This correlated with the CT scan showing a mass involving the central portion of the pancreas and superior to this thought to be metastatic colon cancer. She was taken to the operating room and upper endoscopy showed a mass invading the posterior medial wall the stomach, subsequent biopsy during the operative procedure showing evidence of colon cancer. She was found to have obstruction of the fourth portion of the duodenum from the same malignancy. She underwent an antecolic gastrojejunostomy and gastrostomy tube placement. She had slow return of GI function. She was managed with TPN. The patient's diet was gradually advanced and this was tolerated. Pain control was coronary with Dr. Oliva Bustard for medical oncology. The patient has significant back pain from the tumor mass in the retroperitoneum. Radiation treatments had been put on hold while she was recovering from the surgery.  Consults: Pharmacy  Significant Diagnostic Studies: UGI/SBFT     Discharge Exam: Blood pressure 105/55, pulse 122,  temperature 98.6 F (37 C), temperature source Oral, resp. rate 18, height 5\' 4"  (1.626 m), weight 153 lb 11.2 oz (69.718 kg), last menstrual period 12/18/2015, SpO2 100 %. Examination of disc discharge showed adequate hydration. Clear cardiopulmonary exam. The abdomen was soft and nontender. Wound healing well. No evidence of lower extremity edema.  Disposition: 01-Home or Self Care  Discharge Instructions    Discharge instructions    Complete by:  As directed   OK to shower. Eat frequent small meals.  No calorie free foods! Milk of magnesia: 1 ounce every other day if no bowel movement. Use tylenol with dilaudid for best pain control.     Increase activity slowly    Complete by:  As directed             Medication List    STOP taking these medications        KLOR-CON 20 MEQ packet  Generic drug:  potassium chloride     lidocaine 5 %  Commonly known as:  LIDODERM     polyethylene glycol packet  Commonly known as:  MIRALAX / GLYCOLAX     predniSONE 20 MG tablet  Commonly known as:  DELTASONE     traMADol 50 MG tablet  Commonly known as:  ULTRAM      TAKE these medications        acetaminophen 500 MG tablet  Commonly known as:  TYLENOL  Take 500 mg by mouth every 6 (six) hours as needed.     acetaminophen 325 MG tablet  Commonly known as:  TYLENOL  Take 2 tablets (650 mg total) by mouth every 4 (four) hours while awake.     HYDROmorphone 2 MG tablet  Commonly known as:  DILAUDID  One tablet five times a day for control of cancer pain. May take up to two (2) additional doses/ 24 hours for breakthrough pain.     megestrol 400 MG/10ML suspension  Commonly known as:  MEGACE  Take 10 mLs (400 mg total) by mouth 2 (two) times daily.     omeprazole 20 MG capsule  Commonly known as:  PRILOSEC  Take 1 capsule (20 mg total) by mouth 2 (two) times daily before a meal.     ondansetron 4 MG disintegrating tablet  Commonly known as:  ZOFRAN-ODT  Take 1 tablet (4 mg  total) by mouth every 4 (four) hours as needed for nausea or vomiting.     prochlorperazine 10 MG tablet  Commonly known as:  COMPAZINE  Take 1 tablet by mouth every 4-6 hours as needed for nausea, vomiting     promethazine 25 MG suppository  Commonly known as:  PHENERGAN  Place 1 suppository (25 mg total) rectally every 6 (six) hours as needed for nausea or vomiting.     valACYclovir 1000 MG tablet  Commonly known as:  VALTREX  Take 1,000 mg by mouth 2 (two) times daily. Reported on 12/31/2015           Follow-up Information    Follow up with Bary Castilla [name], MD.   Specialties:  General Surgery, Radiology   Why:  April 4th @ 9:30 AM   Contact information:   [place] [address] [phone]      Arrangements have been made for follow-up with both medical oncology and general surgery.  Dietary restrictions reviewed: No raw fruits or vegetables, frequent small meals. 1 L (1 quart) sees minimal oral liquids daily. Signed: Robert Bellow 01/25/2016, 1:01 PM

## 2016-01-25 NOTE — Progress Notes (Signed)
Misty Mack  Patient Care Team: Misty Jude, MD as PCP - General (Family Medicine) [name], MD (Oncology) Seeplaputhur Misty Haines, MD (General Surgery)   SUMMARY OF ONCOLOGIC HISTORY:  # METASTATIC COLORECTAL CANCER   INTERVAL HISTORY:  45 year old female patient with above history of metastatic colorectal cancer who is recently discharged from the hospital after she had debulking surgery which was invading her small bowel. Patient is currently status post PEG tube.  Patient  Complaints of mild-to-moderate pain in her lower back and the left side.  She has been taking Dilaudid as needed. Otherwise denies any  Ongoing nausea vomiting. Her appetite is poor.  Denies any chest pain or shortness of breath. Complains of fatigue.    Denies any fevers or chills.  She is taking prednisone for appetite  Stimulation.   REVIEW OF SYSTEMS:  A complete 10 point review of system is done which is negative except mentioned above/history of present illness.   PAST MEDICAL HISTORY :  Past Medical History  Diagnosis Date  . Ulcer   . Anemia   . Obesity   . Constipation   . Migraines   . Cancer ([place]) 2015    colon  . Colon cancer metastasized to liver [facility]) April 2015    T3, N2, M1, stage IV with biopsy-proven liver metastases. On adjuvant chemotherapy    PAST SURGICAL HISTORY :   Past Surgical History  Procedure Laterality Date  . Cholecystectomy  2014  . Tubal ligation  2005  . Colonoscopy  2015  . Upper gi endoscopy  2015  . Esophagogastroduodenoscopy (egd) with propofol N/A 11/19/2015    Procedure: ESOPHAGOGASTRODUODENOSCOPY (EGD) WITH PROPOFOL;  Surgeon: Misty Lye, MD;  Location: [facility] ENDOSCOPY;  Service: Endoscopy;  Laterality: N/A;  . Laparotomy N/A 01/08/2016    Procedure: EXPLORATORY LAPAROTOMY, GASTRIC Jejunostomy, gastrostomy, upper endoscopy;  Surgeon: Misty Bellow, MD;  Location: [facility] ORS;  Service: General;  Laterality:  N/A;    FAMILY HISTORY :  No family history on file.  SOCIAL HISTORY:   Social History  Substance Use Topics  . Smoking status: Never Smoker   . Smokeless tobacco: Not on file  . Alcohol Use: No    ALLERGIES:  is allergic to fentanyl; oxaliplatin; and hydrocodone-acetaminophen.  MEDICATIONS:  Current Outpatient Prescriptions  Medication Sig Dispense Refill  . acetaminophen (TYLENOL) 325 MG tablet Take 2 tablets (650 mg total) by mouth every 4 (four) hours while awake. 100 tablet 3  . acetaminophen (TYLENOL) 500 MG tablet Take 500 mg by mouth every 6 (six) hours as needed.    Marland Kitchen HYDROmorphone (DILAUDID) 2 MG tablet One tablet five times a day for control of cancer pain. May take up to two (2) additional doses/ 24 hours for breakthrough pain. 120 tablet 0  . megestrol (MEGACE) 400 MG/10ML suspension Take 10 mLs (400 mg total) by mouth 2 (two) times daily. 240 mL 3  . omeprazole (PRILOSEC) 20 MG capsule Take 1 capsule (20 mg total) by mouth 2 (two) times daily before a meal. 60 capsule 5  . ondansetron (ZOFRAN-ODT) 4 MG disintegrating tablet Take 1 tablet (4 mg total) by mouth every 4 (four) hours as needed for nausea or vomiting. 20 tablet 0  . prochlorperazine (COMPAZINE) 10 MG tablet Take 1 tablet by mouth every 4-6 hours as needed for nausea, vomiting 30 tablet 3  . promethazine (PHENERGAN) 25 MG suppository Place 1 suppository (25 mg total) rectally every 6 (six) hours  as needed for nausea or vomiting. 6 each 0  . valACYclovir (VALTREX) 1000 MG tablet Take 1,000 mg by mouth 2 (two) times daily. Reported on 12/31/2015     No current facility-administered medications for this visit.   Facility-Administered Medications Ordered in Other Visits  Medication Dose Route Frequency Provider Last Rate Last Dose  . sodium chloride 0.9 % injection 10 mL  10 mL Intracatheter PRN [name], MD   10 mL at 03/21/15 1025  . sodium chloride 0.9 % injection 10 mL  10 mL Intravenous PRN [name],  MD   10 mL at 04/21/15 1025  . sodium chloride 0.9 % injection 10 mL  10 mL Intracatheter PRN [name], MD   10 mL at 12/03/15 1105    PHYSICAL EXAMINATION:   BP 97/68 mmHg  Pulse 120  Temp(Src) 98 F (36.7 C) (Tympanic)  Resp 18  Wt 137 lb 12.6 oz (62.5 kg)  LMP 12/18/2015  Filed Weights   01/25/16 1438  Weight: 137 lb 12.6 oz (62.5 kg)    GENERAL: Well-nourished well-developed; Alert, no distress and comfortable.   With her mother.  EYES: no pallor or icterus OROPHARYNX: no thrush or ulceration; good dentition  NECK: supple, no masses felt LYMPH:  no palpable lymphadenopathy in the cervical, axillary or inguinal regions LUNGS: clear to auscultation and  No wheeze or crackles HEART/CVS: regular rate & rhythm and no murmurs; No lower extremity edema; PEG tube in place.  ABDOMEN:abdomen soft, non-tender and normal bowel sounds Musculoskeletal:no cyanosis of digits and no clubbing  PSYCH: alert & oriented x 3 with fluent speech NEURO: no focal motor/sensory deficits SKIN:  no rashes or significant lesions  LABORATORY DATA:  I have reviewed the data as listed    Component Value Date/Time   NA 129* 01/25/2016 1419   NA 134* 02/22/2015 0834   K 3.0* 01/25/2016 1419   K 4.3 02/22/2015 0834   CL 96* 01/25/2016 1419   CL 109 02/22/2015 0834   CO2 26 01/25/2016 1419   CO2 22 02/22/2015 0834   GLUCOSE 131* 01/25/2016 1419   GLUCOSE 178* 02/22/2015 0834   BUN 9 01/25/2016 1419   BUN 9 02/22/2015 0834   CREATININE 0.56 01/25/2016 1419   CREATININE 0.56 02/22/2015 0834   CALCIUM 8.2* 01/25/2016 1419   CALCIUM 9.0 02/22/2015 0834   PROT 7.1 01/25/2016 1419   PROT 7.5 02/22/2015 0834   ALBUMIN 2.3* 01/25/2016 1419   ALBUMIN 3.7 02/22/2015 0834   AST 21 01/25/2016 1419   AST 24 02/22/2015 0834   ALT 19 01/25/2016 1419   ALT 13* 02/22/2015 0834   ALKPHOS 109 01/25/2016 1419   ALKPHOS 69 02/22/2015 0834   BILITOT 0.5 01/25/2016 1419   BILITOT 0.5 02/22/2015 0834    GFRNONAA >60 01/25/2016 1419   GFRNONAA >60 02/22/2015 0834   GFRNONAA >60 11/21/2014 0906   GFRAA >60 01/25/2016 1419   GFRAA >60 02/22/2015 0834   GFRAA >60 11/21/2014 0906    No results found for: SPEP, UPEP  Lab Results  Component Value Date   WBC 16.9* 01/25/2016   NEUTROABS 14.2* 01/25/2016   HGB 9.0* 01/25/2016   HCT 26.2* 01/25/2016   MCV 82.7 01/25/2016   PLT 675* 01/25/2016      Chemistry      Component Value Date/Time   NA 129* 01/25/2016 1419   NA 134* 02/22/2015 0834   K 3.0* 01/25/2016 1419   K 4.3 02/22/2015 0834   CL 96* 01/25/2016  1419   CL 109 02/22/2015 0834   CO2 26 01/25/2016 1419   CO2 22 02/22/2015 0834   BUN 9 01/25/2016 1419   BUN 9 02/22/2015 0834   CREATININE 0.56 01/25/2016 1419   CREATININE 0.56 02/22/2015 0834      Component Value Date/Time   CALCIUM 8.2* 01/25/2016 1419   CALCIUM 9.0 02/22/2015 0834   ALKPHOS 109 01/25/2016 1419   ALKPHOS 69 02/22/2015 0834   AST 21 01/25/2016 1419   AST 24 02/22/2015 0834   ALT 19 01/25/2016 1419   ALT 13* 02/22/2015 0834   BILITOT 0.5 01/25/2016 1419   BILITOT 0.5 02/22/2015 0834         ASSESSMENT & PLAN:   # metaststatic colorectal cancer- status post debulking surgery. Plan chemoradiation will discuss with Dr. Tollie Pizza and Dr. Donella Stade  # pain control- continue Dilaudid  For pain control.  # leucoytosis- Likely from prednisone. No fevers.  # Poor appetite. Recommend Megace. New prescription in.  # Dehydration/tachycardia recommend IV fluids.  # Hypokalemia potassium 3.0 recommend by mouth potassium.  #Patient follow-up with me in 2 weeks with labs.      Cammie Sickle, MD 01/25/2016 2:59 PM

## 2016-01-28 ENCOUNTER — Ambulatory Visit
Admission: RE | Admit: 2016-01-28 | Discharge: 2016-01-28 | Disposition: A | Discharge status: Home / Self Care | Payer: 59 | Source: Ambulatory Visit | Attending: Radiation Oncology | Admitting: Radiation Oncology

## 2016-01-28 ENCOUNTER — Encounter: Payer: Self-pay | Admitting: Radiation Oncology

## 2016-01-28 ENCOUNTER — Ambulatory Visit: Payer: 59

## 2016-01-28 ENCOUNTER — Telehealth: Payer: Self-pay | Admitting: *Deleted

## 2016-01-28 VITALS — BP 98/60 | Temp 96.2°F | Resp 18 | Wt 138.8 lb

## 2016-01-28 DIAGNOSIS — C189 Malignant neoplasm of colon, unspecified: Secondary | ICD-10-CM

## 2016-01-28 MED ORDER — ONDANSETRON 4 MG PO TBDP: 4.0000 mg | ORAL_TABLET | ORAL | Status: AC | PRN

## 2016-01-28 NOTE — Telephone Encounter (Signed)
Pt requests refill of zofran ODT. Refill sent to pharmacy.

## 2016-01-28 NOTE — Progress Notes (Signed)
Radiation Oncology Follow up Note  Name: Misty Mack   Date:   01/28/2016 MRN:  [id number] DOB: 1971-02-04    This 45 y.o. female presents to the clinic today for possible resuming treatment for stage IV colon cancer.  REFERRING PROVIDER: Lynnell Jude, MD  HPI: Patient is a 45 year old female who was in the middle of palliative radiation therapy to a large metastatic mass in the area of her pancreas and greater curvature of her stomach. She was originally diagnosed with cancer of the splenic flexure 8 T3 N2 M1 disease with biopsy-proven metastasis to liver. She had an R1 resection with radial margin positive. Her tumor was K-ras mutated she was started on FOLFOX and is been on FOLFIRI plus Avastin. We started palliative radiation to this large mass based on her increasing back pain.Marland Kitchen Her treatment was interrupted by obstruction and she had a debulking surgery with tumor invading her small bowel. She's currently narcotic dependent. CT scan shows some decrease in size and looks like necrosis of her Her tumor. She seen today for discussion about resuming treatments to this area.  COMPLICATIONS OF TREATMENT: none  FOLLOW UP COMPLIANCE: keeps appointments   PHYSICAL EXAM:  BP 98/60 mmHg  Temp(Src) 96.2 F (35.7 C)  Resp 18  Wt 138 lb 12.5 oz (62.95 kg)  LMP 12/18/2015 Thin cachectic female in NAD. She does have a PEG tube placed. Well-developed well-nourished patient in NAD. HEENT reveals PERLA, EOMI, discs not visualized.  Oral cavity is clear. No oral mucosal lesions are identified. Neck is clear without evidence of cervical or supraclavicular adenopathy. Lungs are clear to A&P. Cardiac examination is essentially unremarkable with regular rate and rhythm without murmur rub or thrill. Abdomen is benign with no organomegaly or masses noted. Motor sensory and DTR levels are equal and symmetric in the upper and lower extremities. Cranial nerves II through XII are grossly intact. Proprioception  is intact. No peripheral adenopathy or edema is identified. No motor or sensory levels are noted. Crude visual fields are within normal range.  RADIOLOGY RESULTS: CT scan is reviewed and compared to prior study showing treatment response.  PLAN: At this time like to resume her palliative radiation therapy would like to give her another 2 and half weeks of treatment complete her prescription. I've set her up for CT simulation to reevaluate the area of being treated and make sure we have still good coverage after this treatment break. Patient and her mother both seem to comprehend my treatment plan well. We will also having her anti-medic therapy renewed. I personally set up simulation appointment.  I would like to take this opportunity for allowing me to participate in the care of your patient.Armstead Peaks., MD

## 2016-01-29 ENCOUNTER — Ambulatory Visit (INDEPENDENT_AMBULATORY_CARE_PROVIDER_SITE_OTHER): Payer: 59 | Admitting: General Surgery

## 2016-01-29 ENCOUNTER — Ambulatory Visit: Payer: 59

## 2016-01-29 ENCOUNTER — Encounter: Payer: Self-pay | Admitting: General Surgery

## 2016-01-29 VITALS — BP 118/72 | HR 74 | Resp 12 | Ht 63.0 in | Wt 137.0 lb

## 2016-01-29 DIAGNOSIS — F4329 Adjustment disorder with other symptoms: Secondary | ICD-10-CM

## 2016-01-29 DIAGNOSIS — C787 Secondary malignant neoplasm of liver and intrahepatic bile duct: Secondary | ICD-10-CM

## 2016-01-29 DIAGNOSIS — C189 Malignant neoplasm of colon, unspecified: Secondary | ICD-10-CM

## 2016-01-29 MED ORDER — ALPRAZOLAM 0.5 MG PO TABS: ORAL_TABLET | ORAL | Status: AC

## 2016-01-29 NOTE — Patient Instructions (Signed)
Return in one month.  

## 2016-01-29 NOTE — Progress Notes (Signed)
Patient ID: Misty Mack, female   DOB: May 12, 1971, 45 y.o.   MRN: [id number]  Chief Complaint  Patient presents with  . Routine Post Op    Laparotomy     HPI Misty Mack is a 45 y.o. female. here today for her post op  Exploratory Laparotomy done on 01/08/16. Patient states she is doing well.Patient starting radaiation on Monday.  HPI  Past Medical History  Diagnosis Date  . Ulcer   . Anemia   . Obesity   . Constipation   . Migraines   . Cancer ([place]) 2015    colon  . Colon cancer metastasized to liver [facility]) April 2015    T3, N2, M1, stage IV with biopsy-proven liver metastases. On adjuvant chemotherapy    Past Surgical History  Procedure Laterality Date  . Cholecystectomy  2014  . Tubal ligation  2005  . Colonoscopy  2015  . Upper gi endoscopy  2015  . Esophagogastroduodenoscopy (egd) with propofol N/A 11/19/2015    Procedure: ESOPHAGOGASTRODUODENOSCOPY (EGD) WITH PROPOFOL;  Surgeon: Christene Lye, MD;  Location: [facility] ENDOSCOPY;  Service: Endoscopy;  Laterality: N/A;  . Laparotomy N/A 01/08/2016    Procedure: EXPLORATORY LAPAROTOMY, GASTRIC Jejunostomy, gastrostomy, upper endoscopy;  Surgeon: Robert Bellow, MD;  Location: [facility] ORS;  Service: General;  Laterality: N/A;    No family history on file.  Social History Social History  Substance Use Topics  . Smoking status: Never Smoker   . Smokeless tobacco: None  . Alcohol Use: No    Allergies  Allergen Reactions  . Fentanyl Swelling    Swelling of lips  . Oxaliplatin Itching  . Hydrocodone-Acetaminophen Hives    Current Outpatient Prescriptions  Medication Sig Dispense Refill  . acetaminophen (TYLENOL) 325 MG tablet Take 2 tablets (650 mg total) by mouth every 4 (four) hours while awake. 100 tablet 3  . acetaminophen (TYLENOL) 500 MG tablet Take 500 mg by mouth every 6 (six) hours as needed.    Marland Kitchen HYDROmorphone (DILAUDID) 2 MG tablet One tablet five times a day for control of cancer pain. May  take up to two (2) additional doses/ 24 hours for breakthrough pain. 120 tablet 0  . megestrol (MEGACE) 400 MG/10ML suspension Take 10 mLs (400 mg total) by mouth 2 (two) times daily. 240 mL 3  . omeprazole (PRILOSEC) 20 MG capsule Take 1 capsule (20 mg total) by mouth 2 (two) times daily before a meal. 60 capsule 5  . ondansetron (ZOFRAN-ODT) 4 MG disintegrating tablet Take 1 tablet (4 mg total) by mouth every 4 (four) hours as needed for nausea or vomiting. 30 tablet 3  . prochlorperazine (COMPAZINE) 10 MG tablet Take 1 tablet by mouth every 4-6 hours as needed for nausea, vomiting 30 tablet 3  . promethazine (PHENERGAN) 25 MG suppository Place 1 suppository (25 mg total) rectally every 6 (six) hours as needed for nausea or vomiting. 6 each 0  . valACYclovir (VALTREX) 1000 MG tablet Take 1,000 mg by mouth 2 (two) times daily. Reported on 12/31/2015    . ALPRAZolam (XANAX) 0.5 MG tablet 1/2 - 1 tablet three times a day if needed for anxiety. 60 tablet 1   No current facility-administered medications for this visit.   Facility-Administered Medications Ordered in Other Visits  Medication Dose Route Frequency Provider Last Rate Last Dose  . sodium chloride 0.9 % injection 10 mL  10 mL Intracatheter PRN [name], MD   10 mL at 03/21/15 1025  . sodium chloride  0.9 % injection 10 mL  10 mL Intravenous PRN [name], MD   10 mL at 04/21/15 1025  . sodium chloride 0.9 % injection 10 mL  10 mL Intracatheter PRN [name], MD   10 mL at 12/03/15 1105    Review of Systems Review of Systems  Constitutional: Negative.   Respiratory: Negative.   Cardiovascular: Negative.     Blood pressure 118/72, pulse 74, resp. rate 12, height 5\' 3"  (1.6 m), weight 137 lb (62.143 kg), last menstrual period 12/18/2015.  Physical Exam Physical Exam  Constitutional: She is oriented to person, place, and time. She appears well-developed and well-nourished.  Eyes: Conjunctivae are normal. No scleral icterus.   Neck: Neck supple.  Cardiovascular: Normal rate, regular rhythm and normal heart sounds.   Pulmonary/Chest: Effort normal and breath sounds normal.  Abdominal:    Lymphadenopathy:    She has no cervical adenopathy.  Neurological: She is alert and oriented to person, place, and time.  Skin: Skin is warm and dry.    Data Reviewed DIAGNOSIS:  A. STOMACH, POSTERIOR WALL MASS; BIOPSY:  - METASTATIC ADENOCARCINOMA CONSISTENT WITH COLON ORIGIN, WITH  MUSCULARIS PROPRIA AND MUCOSAL INVOLVEMENT.    Assessment    No further nausea and vomiting status post gastrojejunostomy. Tolerating liquids well, but little appetite.    Plan    The patient has been getting at least a quart of nutritional supplements in daily, but her weight is barely holding steady. She is not having any further nausea and vomiting. I op sees of the mass eroding the posterior wall of the stomach showed metastatic colon cancer. She is scheduled to restart radiation treatments next week. The importance of calorie dense foods was reviewed. Pain is under reasonable management making use of Dilaudid 2 mg QID/ with when necessary use for breakthrough pain   Patient to return in one month.   PCP:  Lavera Guise  This information has been scribed by Gaspar Cola CMA.    Robert Bellow 01/30/2016, 3:47 PM

## 2016-01-30 ENCOUNTER — Ambulatory Visit: Payer: 59

## 2016-01-30 DIAGNOSIS — C189 Malignant neoplasm of colon, unspecified: Secondary | ICD-10-CM | POA: Insufficient documentation

## 2016-01-30 DIAGNOSIS — C787 Secondary malignant neoplasm of liver and intrahepatic bile duct: Secondary | ICD-10-CM

## 2016-01-31 ENCOUNTER — Ambulatory Visit: Admission: RE | Admit: 2016-01-31 | Payer: 59 | Source: Ambulatory Visit

## 2016-01-31 ENCOUNTER — Ambulatory Visit
Admission: RE | Admit: 2016-01-31 | Discharge: 2016-01-31 | Disposition: A | Discharge status: Home / Self Care | Payer: 59 | Source: Ambulatory Visit | Attending: Radiation Oncology | Admitting: Radiation Oncology

## 2016-01-31 ENCOUNTER — Ambulatory Visit: Payer: 59

## 2016-02-01 ENCOUNTER — Ambulatory Visit: Payer: 59

## 2016-02-02 ENCOUNTER — Emergency Department: Payer: 59

## 2016-02-02 ENCOUNTER — Inpatient Hospital Stay
Admission: EM | Admit: 2016-02-02 | Discharge: 2016-02-14 | DRG: 374 | Disposition: A | Discharge status: Home / Self Care | Payer: 59 | Attending: General Surgery | Admitting: General Surgery

## 2016-02-02 ENCOUNTER — Other Ambulatory Visit: Payer: Self-pay

## 2016-02-02 ENCOUNTER — Encounter: Payer: Self-pay | Admitting: Medical Oncology

## 2016-02-02 DIAGNOSIS — Z515 Encounter for palliative care: Secondary | ICD-10-CM | POA: Diagnosis present

## 2016-02-02 DIAGNOSIS — Z862 Personal history of diseases of the blood and blood-forming organs and certain disorders involving the immune mechanism: Secondary | ICD-10-CM | POA: Diagnosis not present

## 2016-02-02 DIAGNOSIS — C7889 Secondary malignant neoplasm of other digestive organs: Secondary | ICD-10-CM | POA: Diagnosis not present

## 2016-02-02 DIAGNOSIS — Z888 Allergy status to other drugs, medicaments and biological substances status: Secondary | ICD-10-CM

## 2016-02-02 DIAGNOSIS — E669 Obesity, unspecified: Secondary | ICD-10-CM | POA: Diagnosis present

## 2016-02-02 DIAGNOSIS — C184 Malignant neoplasm of transverse colon: Secondary | ICD-10-CM

## 2016-02-02 DIAGNOSIS — K279 Peptic ulcer, site unspecified, unspecified as acute or chronic, without hemorrhage or perforation: Secondary | ICD-10-CM | POA: Diagnosis present

## 2016-02-02 DIAGNOSIS — E876 Hypokalemia: Secondary | ICD-10-CM | POA: Diagnosis present

## 2016-02-02 DIAGNOSIS — K869 Disease of pancreas, unspecified: Secondary | ICD-10-CM | POA: Diagnosis present

## 2016-02-02 DIAGNOSIS — C185 Malignant neoplasm of splenic flexure: Secondary | ICD-10-CM | POA: Diagnosis present

## 2016-02-02 DIAGNOSIS — Z85038 Personal history of other malignant neoplasm of large intestine: Secondary | ICD-10-CM | POA: Diagnosis not present

## 2016-02-02 DIAGNOSIS — M549 Dorsalgia, unspecified: Secondary | ICD-10-CM | POA: Diagnosis not present

## 2016-02-02 DIAGNOSIS — Z8669 Personal history of other diseases of the nervous system and sense organs: Secondary | ICD-10-CM | POA: Diagnosis not present

## 2016-02-02 DIAGNOSIS — Z9851 Tubal ligation status: Secondary | ICD-10-CM

## 2016-02-02 DIAGNOSIS — A419 Sepsis, unspecified organism: Secondary | ICD-10-CM

## 2016-02-02 DIAGNOSIS — Z79899 Other long term (current) drug therapy: Secondary | ICD-10-CM | POA: Diagnosis not present

## 2016-02-02 DIAGNOSIS — C787 Secondary malignant neoplasm of liver and intrahepatic bile duct: Secondary | ICD-10-CM | POA: Diagnosis not present

## 2016-02-02 DIAGNOSIS — C772 Secondary and unspecified malignant neoplasm of intra-abdominal lymph nodes: Secondary | ICD-10-CM | POA: Diagnosis not present

## 2016-02-02 DIAGNOSIS — Z6823 Body mass index (BMI) 23.0-23.9, adult: Secondary | ICD-10-CM | POA: Diagnosis not present

## 2016-02-02 DIAGNOSIS — Z923 Personal history of irradiation: Secondary | ICD-10-CM | POA: Diagnosis not present

## 2016-02-02 DIAGNOSIS — K5900 Constipation, unspecified: Secondary | ICD-10-CM | POA: Diagnosis present

## 2016-02-02 DIAGNOSIS — G893 Neoplasm related pain (acute) (chronic): Secondary | ICD-10-CM | POA: Diagnosis present

## 2016-02-02 DIAGNOSIS — E43 Unspecified severe protein-calorie malnutrition: Secondary | ICD-10-CM | POA: Diagnosis present

## 2016-02-02 DIAGNOSIS — Z9889 Other specified postprocedural states: Secondary | ICD-10-CM | POA: Diagnosis not present

## 2016-02-02 DIAGNOSIS — I959 Hypotension, unspecified: Secondary | ICD-10-CM | POA: Diagnosis present

## 2016-02-02 DIAGNOSIS — K311 Adult hypertrophic pyloric stenosis: Secondary | ICD-10-CM | POA: Diagnosis not present

## 2016-02-02 DIAGNOSIS — R000 Tachycardia, unspecified: Secondary | ICD-10-CM | POA: Diagnosis present

## 2016-02-02 DIAGNOSIS — Z931 Gastrostomy status: Secondary | ICD-10-CM

## 2016-02-02 DIAGNOSIS — R109 Unspecified abdominal pain: Secondary | ICD-10-CM | POA: Diagnosis present

## 2016-02-02 DIAGNOSIS — Z9049 Acquired absence of other specified parts of digestive tract: Secondary | ICD-10-CM

## 2016-02-02 DIAGNOSIS — Z8711 Personal history of peptic ulcer disease: Secondary | ICD-10-CM | POA: Diagnosis not present

## 2016-02-02 DIAGNOSIS — Z9221 Personal history of antineoplastic chemotherapy: Secondary | ICD-10-CM

## 2016-02-02 DIAGNOSIS — F1120 Opioid dependence, uncomplicated: Secondary | ICD-10-CM | POA: Diagnosis not present

## 2016-02-02 DIAGNOSIS — C189 Malignant neoplasm of colon, unspecified: Secondary | ICD-10-CM | POA: Diagnosis present

## 2016-02-02 DIAGNOSIS — R110 Nausea: Secondary | ICD-10-CM | POA: Diagnosis not present

## 2016-02-02 DIAGNOSIS — Z510 Encounter for antineoplastic radiation therapy: Secondary | ICD-10-CM | POA: Diagnosis not present

## 2016-02-02 DIAGNOSIS — K56609 Unspecified intestinal obstruction, unspecified as to partial versus complete obstruction: Secondary | ICD-10-CM

## 2016-02-02 LAB — TROPONIN I

## 2016-02-02 LAB — TYPE AND SCREEN
ABO/RH(D): O POS
Antibody Screen: NEGATIVE

## 2016-02-02 LAB — CBC WITH DIFFERENTIAL/PLATELET
BASOS ABS: 0 10*3/uL (ref 0–0.1)
Basophils Relative: 0 %
Eosinophils Absolute: 0.1 10*3/uL (ref 0–0.7)
Eosinophils Relative: 0 %
HEMATOCRIT: 30.2 % — AB (ref 35.0–47.0)
Hemoglobin: 9.7 g/dL — ABNORMAL LOW (ref 12.0–16.0)
LYMPHS ABS: 2.4 10*3/uL (ref 1.0–3.6)
LYMPHS PCT: 15 %
MCH: 26.3 pg (ref 26.0–34.0)
MCHC: 32.2 g/dL (ref 32.0–36.0)
MCV: 81.5 fL (ref 80.0–100.0)
MONO ABS: 1.4 10*3/uL — AB (ref 0.2–0.9)
MONOS PCT: 9 %
NEUTROS ABS: 12.7 10*3/uL — AB (ref 1.4–6.5)
Neutrophils Relative %: 76 %
Platelets: 543 10*3/uL — ABNORMAL HIGH (ref 150–440)
RBC: 3.71 MIL/uL — ABNORMAL LOW (ref 3.80–5.20)
RDW: 23.9 % — AB (ref 11.5–14.5)
WBC: 16.6 10*3/uL — ABNORMAL HIGH (ref 3.6–11.0)

## 2016-02-02 LAB — URINALYSIS COMPLETE WITH MICROSCOPIC (ARMC ONLY)
Bacteria, UA: NONE SEEN
Bilirubin Urine: NEGATIVE
Glucose, UA: NEGATIVE mg/dL
HGB URINE DIPSTICK: NEGATIVE
NITRITE: NEGATIVE
PH: 6 (ref 5.0–8.0)
PROTEIN: NEGATIVE mg/dL
SPECIFIC GRAVITY, URINE: 1.017 (ref 1.005–1.030)

## 2016-02-02 LAB — COMPREHENSIVE METABOLIC PANEL
ALT: 12 U/L — ABNORMAL LOW (ref 14–54)
ANION GAP: 9 (ref 5–15)
AST: 28 U/L (ref 15–41)
Albumin: 2.2 g/dL — ABNORMAL LOW (ref 3.5–5.0)
Alkaline Phosphatase: 119 U/L (ref 38–126)
BUN: 8 mg/dL (ref 6–20)
CO2: 25 mmol/L (ref 22–32)
Calcium: 8.1 mg/dL — ABNORMAL LOW (ref 8.9–10.3)
Chloride: 99 mmol/L — ABNORMAL LOW (ref 101–111)
Creatinine, Ser: 0.64 mg/dL (ref 0.44–1.00)
GFR calc Af Amer: >60 mL/min (ref >60)
GFR calc non Af Amer: >60 mL/min (ref >60)
GLUCOSE: 102 mg/dL — AB (ref 65–99)
POTASSIUM: 3.4 mmol/L — AB (ref 3.5–5.1)
Sodium: 133 mmol/L — ABNORMAL LOW (ref 135–145)
TOTAL PROTEIN: 7.4 g/dL (ref 6.5–8.1)
Total Bilirubin: 1.1 mg/dL (ref 0.3–1.2)

## 2016-02-02 LAB — LACTIC ACID, PLASMA
Lactic Acid, Venous: 2.7 mmol/L (ref 0.5–2.0)
Lactic Acid, Venous: 0.8 mmol/L (ref 0.5–2.0)

## 2016-02-02 LAB — GLUCOSE, CAPILLARY: GLUCOSE-CAPILLARY: 99 mg/dL (ref 65–99)

## 2016-02-02 MED ORDER — DIPHENHYDRAMINE HCL 50 MG/ML IJ SOLN
25.0000 mg | Freq: Once | INTRAMUSCULAR | Status: AC
  Administered 2016-02-02: 25 mg via INTRAVENOUS
  Filled 2016-02-02: qty 1

## 2016-02-02 MED ORDER — PIPERACILLIN-TAZOBACTAM 3.375 G IVPB
3.3750 g | Freq: Three times a day (TID) | INTRAVENOUS | Status: DC
  Administered 2016-02-02 – 2016-02-10 (×22): 3.375 g via INTRAVENOUS
  Filled 2016-02-02 (×24): qty 50

## 2016-02-02 MED ORDER — POTASSIUM CHLORIDE IN NACL 20-0.9 MEQ/L-% IV SOLN
INTRAVENOUS | Status: DC
  Administered 2016-02-03 – 2016-02-10 (×15): via INTRAVENOUS
  Filled 2016-02-02 (×20): qty 1000

## 2016-02-02 MED ORDER — SODIUM CHLORIDE 0.9 % IV BOLUS (SEPSIS)
1000.0000 mL | Freq: Once | INTRAVENOUS | Status: AC
  Administered 2016-02-02: 1000 mL via INTRAVENOUS

## 2016-02-02 MED ORDER — HYDROMORPHONE HCL 1 MG/ML IJ SOLN
1.0000 mg | INTRAMUSCULAR | Status: DC | PRN
  Administered 2016-02-02 – 2016-02-05 (×24): 1 mg via INTRAVENOUS
  Filled 2016-02-02 (×24): qty 1

## 2016-02-02 MED ORDER — MORPHINE SULFATE (PF) 2 MG/ML IV SOLN
2.0000 mg | Freq: Once | INTRAVENOUS | Status: AC
  Administered 2016-02-02: 2 mg via INTRAVENOUS
  Filled 2016-02-02: qty 1

## 2016-02-02 MED ORDER — ONDANSETRON HCL 4 MG/2ML IJ SOLN: 4.0000 mg | Freq: Four times a day (QID) | INTRAMUSCULAR | Status: DC | PRN

## 2016-02-02 MED ORDER — IOPAMIDOL (ISOVUE-300) INJECTION 61%
100.0000 mL | Freq: Once | INTRAVENOUS | Status: AC | PRN
  Administered 2016-02-02: 100 mL via INTRAVENOUS

## 2016-02-02 MED ORDER — PANTOPRAZOLE SODIUM 40 MG IV SOLR
40.0000 mg | Freq: Every day | INTRAVENOUS | Status: DC
  Administered 2016-02-02 – 2016-02-10 (×9): 40 mg via INTRAVENOUS
  Filled 2016-02-02 (×9): qty 40

## 2016-02-02 MED ORDER — LORAZEPAM BOLUS VIA INFUSION: 0.5000 mg | Freq: Three times a day (TID) | INTRAVENOUS | Status: DC | PRN

## 2016-02-02 MED ORDER — ENOXAPARIN SODIUM 30 MG/0.3ML ~~LOC~~ SOLN
30.0000 mg | SUBCUTANEOUS | Status: DC
  Administered 2016-02-02: 30 mg via SUBCUTANEOUS
  Filled 2016-02-02: qty 0.3

## 2016-02-02 MED ORDER — VANCOMYCIN HCL IN DEXTROSE 1-5 GM/200ML-% IV SOLN
1000.0000 mg | Freq: Once | INTRAVENOUS | Status: AC
  Administered 2016-02-02: 1000 mg via INTRAVENOUS
  Filled 2016-02-02: qty 200

## 2016-02-02 MED ORDER — ACETAMINOPHEN 325 MG PO TABS: 650.0000 mg | ORAL_TABLET | Freq: Four times a day (QID) | ORAL | Status: DC | PRN

## 2016-02-02 MED ORDER — DIATRIZOATE MEGLUMINE & SODIUM 66-10 % PO SOLN
30.0000 mL | Freq: Once | ORAL | Status: AC
  Administered 2016-02-02: 30 mL via ORAL
  Filled 2016-02-02: qty 30

## 2016-02-02 MED ORDER — LORAZEPAM 2 MG/ML IJ SOLN
0.5000 mg | Freq: Three times a day (TID) | INTRAMUSCULAR | Status: DC | PRN
  Administered 2016-02-03 – 2016-02-14 (×10): 0.5 mg via INTRAVENOUS
  Filled 2016-02-02 (×10): qty 1

## 2016-02-02 MED ORDER — SODIUM CHLORIDE 0.9 % IV SOLN
INTRAVENOUS | Status: DC
  Administered 2016-02-02: 19:00:00 via INTRAVENOUS

## 2016-02-02 MED ORDER — ONDANSETRON 4 MG PO TBDP: 4.0000 mg | ORAL_TABLET | Freq: Four times a day (QID) | ORAL | Status: DC | PRN

## 2016-02-02 MED ORDER — ACETAMINOPHEN 650 MG RE SUPP: 650.0000 mg | Freq: Four times a day (QID) | RECTAL | Status: DC | PRN

## 2016-02-02 MED ORDER — PIPERACILLIN-TAZOBACTAM 3.375 G IVPB 30 MIN
3.3750 g | Freq: Once | INTRAVENOUS | Status: AC
  Administered 2016-02-02: 3.375 g via INTRAVENOUS
  Filled 2016-02-02: qty 50

## 2016-02-02 NOTE — ED Notes (Signed)
Pt has hx of colon CA with rescection done 1 month ago, pt reports she continues to have weakness and no energy, pt pale in triage. Hypotensive.

## 2016-02-02 NOTE — H&P (Signed)
History of Present Illness: Misty Mack is a 45 y.o. female admitted emergently through the emergency room with chief complaint of back pain. She was initially evaluated by the emergency room staff and Dr. Archie Balboa called for surgical consultation. 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 At that time her tumor was advanced some involved lymph nodes and also has had liver metastasis. She was treated with chemotherapy. Recently she had findings of obstruction of the distal stomach and distal duodenum and proximal jejunum. She had recent surgery with creation of a gastrojejunostomy and also insertion of a gastrostomy tube. She has been requiring additional evaluation and management and treatment with plans for upcoming radiation therapy for treatment of a local metastasis involving the pancreas.  She reports poor oral intake. She has been sipping slowly on some liquids. She did vomit one time some 8 days ago. She reports some mild nausea and also anorexia. She reports no chills or fever. She has been somewhat weak and has had limited degree of ambulation. Has not walked today. She reports increasing pain in the mid aspect of her back and also some mild epigastric discomfort. She reports she has been having small loose bowel movements. She has been voiding several times per day and voided after arrival at the emergency room.  In the course of emergency room evaluation she did have a CT scan of the abdomen and pelvis. I have reviewed those images. There is fluid standing in the stomach with air-fluid level. There is also a pancreatic mass and some small air bubbles adjacent to the mass. There are otherwise appears to be no intra- peritoneal free air.  She has received an intravenous dose of Zosyn. She was also started on vancomycin but developed hives in the vancomycin was discontinued.  Past Medical History:  Past Medical History  Diagnosis Date  .  Ulcer   . Anemia   . Obesity   . Constipation   . Migraines   . Cancer ([place]) 2015    colon  . Colon cancer metastasized to liver [facility]) April 2015    T3, N2, M1, stage IV with biopsy-proven liver metastases. On adjuvant chemotherapy    Problem List: Patient Active Problem List   Diagnosis Date Noted  . Colon cancer metastasized to liver ([place]) 01/30/2016  . Protein-calorie malnutrition, severe 01/02/2016  . Absolute anemia 12/03/2015  . 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 Abdominal pain 11/15/2015  . Colon cancer ([place]) 02/10/2014  . Anemia due to chronic blood loss 02/10/2014  . Pancreatic mass 02/10/2014    Past Surgical History: Past Surgical History  Procedure Laterality Date  . Cholecystectomy  2014  . Tubal ligation  2005  . Colonoscopy  2015  . Upper gi endoscopy  2015  . Esophagogastroduodenoscopy (egd) with propofol N/A 11/19/2015    Procedure: ESOPHAGOGASTRODUODENOSCOPY (EGD) WITH PROPOFOL;  Surgeon: Christene Lye, MD;  Location: [facility] ENDOSCOPY;  Service: Endoscopy;  Laterality: N/A;  . Laparotomy N/A 01/08/2016    Procedure: EXPLORATORY LAPAROTOMY, GASTRIC Jejunostomy, gastrostomy, upper endoscopy;  Surgeon: Robert Bellow, MD;  Location: [facility] ORS;  Service: General;  Laterality: N/A;    Allergies: Allergies  Allergen Reactions  . Fentanyl Swelling    Swelling of lips  . Oxaliplatin Itching  . Hydrocodone-Acetaminophen Hives    Home Medications:Include Tylenol, Zofran, Phenergan, Protonix, Valtrex, Dilaudid Home medication reconciliation was completed with the patient.     Family History: Negative for  colon cancer   Social History:   reports that she has never smoked. She does not have any smokeless tobacco history on file. She reports that she does not drink alcohol or use illicit drugs. The patient denies ETOH, tobacco, or drug use.   Review of Systems: She reports no other recent acute illness such as cough cold or sore  throat. She reports no recent visual changes. She reports minimal degree of shortness of breath. She was started on oxygen upon arrival to the emergency room. She reports no chest pains. No difficulty swallowing other than anorexia. She reports she has had small bowel movements and is voiding. She reports no ankle edema. No recent source or boils. Does report profound weakness and limited activities. Also gradual weight loss. Review of systems otherwise negative.   Physical Examination: BP 104/67 mmHg  Pulse 116  Temp(Src) 98.1 F (36.7 C) (Oral)  Resp 12  Ht 5\' 5"  (1.651 m)  Wt 62.143 kg (137 lb)  BMI 22.80 kg/m2  SpO2 100%  LMP 01/02/2016 (Approximate)  GENERAL:  Awake alert and oriented and in no acute distress.  HEENT:  Head is normocephalic.  Pupils are equal reactive to light.  Extraocular movements are intact. Sclera is clear.  Pharynx is clear.  NECK:  Supple with no palpable mass and no adenopathy.  LUNGS:  Clear without rales rhonchi or wheezes.  HEART:  Regular rhythm S1-S2, without murmur.  CHEST: There is a Port-A-Cath in the left subclavian area which is currently accessed.  BACK: She points  the pain is the central aspect of the back. There appeared to be no ulcers or erythema or mass formation in this area.  ABDOMEN is flat and soft with minimal left upper quadrant tenderness. Her midline incision appears to be well-healed. There is a gastrostomy in the left upper quadrant which appears typical. There is no surrounding erythema  EXTREMITIES: No dependent edema.  NEUROLOGICAL: Awake alert and oriented moving all extremities Data: Lab Results  Component Value Date   WBC 16.6* 02/02/2016   HGB 9.7* 02/02/2016   HCT 30.2* 02/02/2016   MCV 81.5 02/02/2016   PLT 543* 02/02/2016    Recent Labs Lab 02/02/16 1105  HGB 9.7*   Lab Results  Component Value Date   NA 133* 02/02/2016   K 3.4* 02/02/2016   CL 99* 02/02/2016   CO2 25 02/02/2016   BUN 8 02/02/2016    CREATININE 0.64 02/02/2016   Lab Results  Component Value Date   ALT 12* 02/02/2016   AST 28 02/02/2016   ALKPHOS 119 02/02/2016   BILITOT 1.1 02/02/2016   No results for input(s): APTT, INR, PTT in the last 168 hours.  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 Does not appear to be a complication from recent surgery but likely progression of disease. Possible tumor necrosis. Have entered the admission orders into the medical record.  Recommendations: I recommended admission to the hospital for intravenous hydration and intravenous Invanz and pain control. I discussed with her and her family that I do not think she needs any emergency surgery at present   This was discussed initially with Dr. Archie Balboa and later with Dr.Quale in the emergency room. Duration of consultation 90 minutes  Rochel Brome, MD

## 2016-02-02 NOTE — ED Provider Notes (Signed)
[facility] Emergency Department Provider Note  ____________________________________________  Time seen: ~1035  I have reviewed the triage vital signs and the nursing notes.   HISTORY  Chief Complaint Weakness   History limited by: Not Limited, some history obtained from family   HPI Misty Mack is a 45 y.o. female with history of colon cancer who presents to the emergency department today because of increasing weakness. The family member states that the patient has not been eating since a surgery last month for an intestinal obstruction. The patient has been gradually and constantly getting weaker since that time. The patient has also been having continued abdominal and back pain that has been constant and is no longer responding to oral pain medication. No fevers noted recently.     Past Medical History  Diagnosis Date  . Ulcer   . Anemia   . Obesity   . Constipation   . Migraines   . Cancer ([place]) 2015    colon  . Colon cancer metastasized to liver [facility]) April 2015    T3, N2, M1, stage IV with biopsy-proven liver metastases. On adjuvant chemotherapy    Patient Active Problem List   Diagnosis Date Noted  . Colon cancer metastasized to liver ([place]) 01/30/2016  . Protein-calorie malnutrition, severe 01/02/2016  . Absolute anemia 12/03/2015  . 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 Abdominal pain 11/15/2015  . Colon cancer ([place]) 02/10/2014  . Anemia due to chronic blood loss 02/10/2014  . Pancreatic mass 02/10/2014    Past Surgical History  Procedure Laterality Date  . Cholecystectomy  2014  . Tubal ligation  2005  . Colonoscopy  2015  . Upper gi endoscopy  2015  . Esophagogastroduodenoscopy (egd) with propofol N/A 11/19/2015    Procedure: ESOPHAGOGASTRODUODENOSCOPY (EGD) WITH PROPOFOL;  Surgeon: Christene Lye, MD;  Location: [facility] ENDOSCOPY;  Service: Endoscopy;  Laterality: N/A;  . Laparotomy N/A 01/08/2016     Procedure: EXPLORATORY LAPAROTOMY, GASTRIC Jejunostomy, gastrostomy, upper endoscopy;  Surgeon: Robert Bellow, MD;  Location: [facility] ORS;  Service: General;  Laterality: N/A;    Current Outpatient Rx  Name  Route  Sig  Dispense  Refill  . acetaminophen (TYLENOL) 325 MG tablet   Oral   Take 2 tablets (650 mg total) by mouth every 4 (four) hours while awake.   100 tablet   3   . acetaminophen (TYLENOL) 500 MG tablet   Oral   Take 500 mg by mouth every 6 (six) hours as needed.         . ALPRAZolam (XANAX) 0.5 MG tablet      1/2 - 1 tablet three times a day if needed for anxiety.   60 tablet   1   . HYDROmorphone (DILAUDID) 2 MG tablet      One tablet five times a day for control of cancer pain. May take up to two (2) additional doses/ 24 hours for breakthrough pain.   120 tablet   0   . megestrol (MEGACE) 400 MG/10ML suspension   Oral   Take 10 mLs (400 mg total) by mouth 2 (two) times daily.   240 mL   3   . omeprazole (PRILOSEC) 20 MG capsule   Oral   Take 1 capsule (20 mg total) by mouth 2 (two) times daily before a meal.   60 capsule   5   . ondansetron (ZOFRAN-ODT) 4 MG disintegrating tablet   Oral   Take 1 tablet (4 mg  total) by mouth every 4 (four) hours as needed for nausea or vomiting.   30 tablet   3   . prochlorperazine (COMPAZINE) 10 MG tablet      Take 1 tablet by mouth every 4-6 hours as needed for nausea, vomiting   30 tablet   3   . promethazine (PHENERGAN) 25 MG suppository   Rectal   Place 1 suppository (25 mg total) rectally every 6 (six) hours as needed for nausea or vomiting.   6 each   0   . valACYclovir (VALTREX) 1000 MG tablet   Oral   Take 1,000 mg by mouth 2 (two) times daily. Reported on 12/31/2015           Allergies Fentanyl; Oxaliplatin; and Hydrocodone-acetaminophen  No family history on file.  Social History Social History  Substance Use Topics  . Smoking status: Never Smoker   . Smokeless tobacco: None  .  Alcohol Use: No    Review of Systems  Constitutional: Negative for fever. Cardiovascular: Negative for chest pain. Respiratory: Negative for shortness of breath. Gastrointestinal: Positive for abdominal pain. Neurological: Negative for headaches, focal weakness or numbness.  10-point ROS otherwise negative.  ____________________________________________   PHYSICAL EXAM:  VITAL SIGNS: ED Triage Vitals  Enc Vitals Group     BP 02/02/16 1034 87/49 mmHg     Pulse Rate 02/02/16 1034 130     Resp 02/02/16 1034 20     Temp 02/02/16 1034 98.1 F (36.7 C)     Temp Source 02/02/16 1034 Oral     SpO2 02/02/16 1034 100 %     Weight 02/02/16 1034 137 lb (62.143 kg)     Height 02/02/16 1034 5\' 5"  (1.651 m)     Head Cir --      Peak Flow --      Pain Score 02/02/16 1034 8   Constitutional: Alert and oriented. Chronically ill-appearing. Pale. Eyes: Conjunctivae are normal. PERRL. Normal extraocular movements. ENT   Head: Normocephalic and atraumatic.   Nose: No congestion/rhinnorhea.   Mouth/Throat: Mucous membranes are moist.   Neck: No stridor. Hematological/Lymphatic/Immunilogical: No cervical lymphadenopathy. Cardiovascular: Tachycardic, regular rhythm.  No murmurs, rubs, or gallops. Respiratory: Normal respiratory effort without tachypnea nor retractions. Breath sounds are clear and equal bilaterally. No wheezes/rales/rhonchi. Gastrointestinal: Soft and diffusely tender to palpation. Well healing midline surgical incision. Genitourinary: Deferred Musculoskeletal: Normal range of motion in all extremities. No joint effusions.  No lower extremity tenderness nor edema. Neurologic:  Normal speech and language. No gross focal neurologic deficits are appreciated.  Skin:  Skin is warm, dry and intact. No rash noted. Psychiatric: Mood and affect are normal. Speech and behavior are normal. Patient exhibits appropriate insight and  judgment.  ____________________________________________    LABS (pertinent positives/negatives)  Labs Reviewed  CBC WITH DIFFERENTIAL/PLATELET - Abnormal; Notable for the following:    WBC 16.6 (*)    RBC 3.71 (*)    Hemoglobin 9.7 (*)    HCT 30.2 (*)    RDW 23.9 (*)    Platelets 543 (*)    Neutro Abs 12.7 (*)    Monocytes Absolute 1.4 (*)    All other components within normal limits  COMPREHENSIVE METABOLIC PANEL - Abnormal; Notable for the following:    Sodium 133 (*)    Potassium 3.4 (*)    Chloride 99 (*)    Glucose, Bld 102 (*)    Calcium 8.1 (*)    Albumin 2.2 (*)    ALT 12 (*)  All other components within normal limits  LACTIC ACID, PLASMA - Abnormal; Notable for the following:    Lactic Acid, Venous 2.7 (*)    All other components within normal limits  URINALYSIS COMPLETEWITH MICROSCOPIC ([facility] ONLY) - Abnormal; Notable for the following:    Color, Urine YELLOW (*)    APPearance HAZY (*)    Ketones, ur 1+ (*)    Leukocytes, UA 3+ (*)    Squamous Epithelial / LPF 6-30 (*)    All other components within normal limits  CULTURE, BLOOD (ROUTINE X 2)  CULTURE, BLOOD (ROUTINE X 2)  LACTIC ACID, PLASMA  TROPONIN I  GLUCOSE, CAPILLARY  TYPE AND SCREEN     ____________________________________________   EKG  I, Nance Pear, attending physician, personally viewed and interpreted this EKG  EKG Time: 1045 Rate: 139 Rhythm: sinus tachycardia Axis: normal Intervals: qtc 478 QRS: narrow ST changes: no st elevation Impression: abnormal ekg  ____________________________________________    RADIOLOGY  CT abd/pel IMPRESSION:  Persistent pancreatic mass with involvement of the adjacent stomach and fourth portion of the duodenum. There are now foci of air within the mass suggesting a degree of communication with a at the stomach with the duodenum.  Slight increase in size of retroperitoneal lymphadenopathy when compare with the prior exam now  measuring approximately 19 mm. Previous largest measurement was 14 mm.  The remainder of the exam is stable in appearance.  ____________________________________________   PROCEDURES  Procedure(s) performed: None  Critical Care performed: No  ____________________________________________   INITIAL IMPRESSION / ASSESSMENT AND PLAN / ED COURSE  Pertinent labs & imaging results that were available during my care of the patient were reviewed by me and considered in my medical decision making (see chart for details).  Patient presented to the emergency department today brought in because of concerns for increased weakness. Patient appears to have some failure to thrive secondary to colon cancer and did have a recent operation last month. Family states that since that time she has not eaten. On exam here patient was pale, somewhat chronically ill-appearing. Lactic acid was initially elevated. This did improve after IV fluids. Initially patient had a leukocytosis. Both of these findings and tachycardia and relative hypotension were concerning for sepsis. Patient was given broad-spectrum antibiotics. A CT scan of the abdomen was obtained which showed a persistent pancreatic mass with some air now in it. Dr. Tamala Julian with surgery was contacted and he evaluated the CT. Will come see the patient.   ____________________________________________   FINAL CLINICAL IMPRESSION(S) / ED DIAGNOSES  Final diagnoses:  Abdominal pain, unspecified abdominal location  Sepsis, due to unspecified organism [facility])     Nance Pear, MD 02/03/16 1329

## 2016-02-02 NOTE — ED Notes (Signed)
Pt had hives on left arm following administration of vancomycin. Vancomycin stopped. MD notified.

## 2016-02-02 NOTE — ED Notes (Addendum)
G-tube draining by gravity per Dr. Tamala Julian.

## 2016-02-02 NOTE — ED Notes (Signed)
Called 1C to give report. Unable to take report at this time. Will call back.

## 2016-02-03 MED ORDER — ZOLPIDEM TARTRATE 5 MG PO TABS
5.0000 mg | ORAL_TABLET | Freq: Every evening | ORAL | Status: DC | PRN
  Administered 2016-02-03: 22:00:00 5 mg via ORAL
  Filled 2016-02-03: qty 1

## 2016-02-03 MED ORDER — SODIUM CHLORIDE 0.9% FLUSH: 10.0000 mL | INTRAVENOUS | Status: DC | PRN

## 2016-02-03 MED ORDER — ENOXAPARIN SODIUM 40 MG/0.4ML ~~LOC~~ SOLN
40.0000 mg | SUBCUTANEOUS | Status: DC
  Administered 2016-02-03 – 2016-02-06 (×4): 40 mg via SUBCUTANEOUS
  Filled 2016-02-03 (×4): qty 0.4

## 2016-02-03 NOTE — Progress Notes (Signed)
Chief complaint today is back pain which is some better with the lighted and feels less anxiety with Ativan. Her gastrostomy tube was connected to drainage bag last night and drained 750 cc. Another 750 cc was recorded this morning. The drainage appears to be a clear bilious drainage. She reports minimal degree of dizziness. She reports she has been taking some ice chips and sips of water. She said no bowel movement since admission. She has been passing some flatus. She reports she did pass adequate amount of urine last night and again this morning. Although this was not recorded. The  Past history of carcinoma transverse colon with metastasis noted  She is afebrile. Pulse rate is slightly slower than yesterday but still above the 100. Blood pressure above the 100. Respiratory rate 18. Oxygen saturation 100% on room air. She is awake alert and oriented. Lung sounds are clear with was no respiratory distress. Abdomen is soft and flat and nontender. Gastrostomy tube is noted with the bilious drainage. Her incision appears to be healing satisfactorily.  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 Possible tumor necrosis. Bilious drainage suggests that the gastrojejunostomy is patent. Does not appear to be a complication of surgery but progression of disease.  Plan is to continue IV fluids for hydration, intravenous Invanz. We will continue to have the gastrostomy connected to straight drainage and monitor output so that if the output decreases then clamp the tube.  Monitor intake and outputAmbulate with assistance. We'll contact Dr. Bary Castilla to see tomorrow. She has an appointment to set up radiation therapy tomorrow afternoon. Will need to make a decision about how to proceed. Recheck CBC and metabolic panel be tomorrow.

## 2016-02-03 NOTE — Progress Notes (Signed)
Anticoagulation monitoring(Lovenox):  45yo F ordered Lovenox 30 mg Q24h  Filed Weights   02/02/16 1034  Weight: 137 lb (62.143 kg)   BMI 22.8   Lab Results  Component Value Date   CREATININE 0.64 02/02/2016   CREATININE 0.56 01/25/2016   CREATININE 0.46 01/18/2016   Estimated Creatinine Clearance: 79.9 mL/min (by C-G formula based on Cr of 0.64).   Platelets: 543  Hemoglobin & Hematocrit     Component Value Date/Time   HGB 9.7* 02/02/2016 1105   HGB 13.6 02/22/2015 0834   HCT 30.2* 02/02/2016 1105   HCT 40.3 02/22/2015 0834     Per Protocol for Patient with estCrcl > 30 ml/min and BMI < 40, will transition to Lovenox 40 mg Q24h     Chinita Greenland PharmD Clinical Pharmacist 02/03/2016

## 2016-02-03 NOTE — Plan of Care (Signed)
Problem: Tissue Perfusion: Goal: Risk factors for ineffective tissue perfusion will decrease Outcome: Progressing Pt with complaints of pain, PRN pain meds given with relief, dressing to Gtube intact, Gtube draining to gravity, pt with no distress noted, family at bedside, pt voices any needs

## 2016-02-04 ENCOUNTER — Ambulatory Visit
Admit: 2016-02-04 | Discharge: 2016-02-04 | Disposition: A | Discharge status: Home / Self Care | Payer: 59 | Attending: Radiation Oncology | Admitting: Radiation Oncology

## 2016-02-04 ENCOUNTER — Inpatient Hospital Stay: Payer: 59

## 2016-02-04 ENCOUNTER — Ambulatory Visit: Admission: RE | Admit: 2016-02-04 | Payer: 59 | Source: Ambulatory Visit

## 2016-02-04 LAB — BASIC METABOLIC PANEL
ANION GAP: 4 — AB (ref 5–15)
BUN: <5 mg/dL — ABNORMAL LOW (ref 6–20)
CALCIUM: 7.8 mg/dL — AB (ref 8.9–10.3)
CHLORIDE: 122 mmol/L — AB (ref 101–111)
CO2: 19 mmol/L — AB (ref 22–32)
Creatinine, Ser: 0.48 mg/dL (ref 0.44–1.00)
GFR calc non Af Amer: >60 mL/min (ref >60)
GLUCOSE: 78 mg/dL (ref 65–99)
POTASSIUM: 3.5 mmol/L (ref 3.5–5.1)
SODIUM: 145 mmol/L (ref 135–145)

## 2016-02-04 LAB — CBC
HCT: 21.8 % — ABNORMAL LOW (ref 35.0–47.0)
HEMOGLOBIN: 7.1 g/dL — AB (ref 12.0–16.0)
MCH: 27 pg (ref 26.0–34.0)
MCHC: 32.4 g/dL (ref 32.0–36.0)
MCV: 83.2 fL (ref 80.0–100.0)
PLATELETS: 373 10*3/uL (ref 150–440)
RBC: 2.62 MIL/uL — AB (ref 3.80–5.20)
RDW: 23.9 % — ABNORMAL HIGH (ref 11.5–14.5)
WBC: 9.6 10*3/uL (ref 3.6–11.0)

## 2016-02-04 MED ORDER — DIPHENHYDRAMINE HCL 50 MG/ML IJ SOLN
12.5000 mg | Freq: Once | INTRAMUSCULAR | Status: AC
  Administered 2016-02-04: 12.5 mg via INTRAVENOUS
  Filled 2016-02-04: qty 1

## 2016-02-04 MED ORDER — TRAZODONE HCL 50 MG PO TABS
50.0000 mg | ORAL_TABLET | Freq: Every evening | ORAL | Status: DC | PRN
  Administered 2016-02-04 – 2016-02-07 (×3): 50 mg via ORAL
  Filled 2016-02-04 (×3): qty 1

## 2016-02-04 NOTE — Care Management (Signed)
Admitted to this facility with the diagnosis of colon cancer. Discharged from this facility 01/18/16 with G-tube in place and no follow-up services.  Lives alone. Mother is Jamessa Demus [phone] or [phone]). Receiving radiation. Independent of needs, but mother helps when needed. Shelbie Ammons RN MSN CCM Care Management [phone]

## 2016-02-04 NOTE — Progress Notes (Signed)
Afebrile. Mild tachycardia persists. BP stable. U/O stable. GT drainage down. Pain is not ideally controlled.  Left mid/ lower back, not in area of pancreas. Lungs: Clear. Cardio: RR. ABD: Mild left sided discomfort to palpation. Extrem: Soft. CT from admission reviewed. Enlarged retroperitoneal node is in area of pain, but no local inflammatory process noted. Pancreatic area shows a mass (again) known from recent EGD to be invading the posterior stomach near the incisura. No evidence of gastric obstruction from the mass. Non-dilated bowel at the gastrojejunostomy site. Reviewed diet (liquids) at home. Taking perhaps 1 nutritional supplement ( 250 calories) daily with large volumes of nutrition free liquids. (Had reviewed importance of protein > calories at last OV). Patient may benefit from a Moss gastrostomy tube to allow enteric feedings with gastric drainage.

## 2016-02-04 NOTE — Progress Notes (Signed)
Initial Nutrition Assessment  DOCUMENTATION CODES:   Severe malnutrition in context of chronic illness  INTERVENTION:   -Await diet advancement as medically able. Will follow poc. -Noted pt likes and drinks Carn[employer] instant breakfast well, will recommend when able   NUTRITION DIAGNOSIS:   Malnutrition related to chronic illness, cancer and cancer related treatments as evidenced by energy intake < or equal to 75% for > or equal to 1 month, percent weight loss, mild depletion of muscle mass.  GOAL:   Patient will meet greater than or equal to 90% of their needs  MONITOR:   Diet advancement, Labs, Weight trends, I & O's  REASON FOR ASSESSMENT:   Malnutrition Screening Tool    ASSESSMENT:   Pt admitted with weakness. Pt with h/o colon cancer with mets to liver, lymph nodes with pancreatic lass. Pt s/p gastrojejunostomy and placement of a gastrostomy tube set to suction currently. Pt scheduled for radiation therapy.  Past Medical History  Diagnosis Date  . Ulcer   . Anemia   . Obesity   . Constipation   . Migraines   . Cancer ([place]) 2015    colon  . Colon cancer metastasized to liver [facility]) April 2015    T3, N2, M1, stage IV with biopsy-proven liver metastases. On adjuvant chemotherapy   Diet Order:  Diet NPO time specified Except for: Ice Chips   Currently pt is NPO.  Pt reports since last admission roughly one month ago, pt reports hardly eating anything. Pt reports drinking fluids and little bites of soft foods. Pt reports being able to drink [employer] but no other supplements. RD asked about eating ice creams, puddings, etc and pt replied that she does not like them and is sick of eating applesauce. Mother at bedside reports very poor po intake if any at all.   RD notes on last admission, 3/8 initial assessment pt had reported poor po intake since the beginning of the year with little to no appetite. During the admission, on 3/10 pt was started on  TPN (5%AA/20%Dextrose at 64mL/hr), weaning off TPN on 3/23.  Medications: Protonix, NS with KCl at 136mL/hr    Electrolyte/Renal Profile and Glucose Profile:   Recent Labs Lab 02/02/16 1105 02/04/16 0500  NA 133* 145  K 3.4* 3.5  CL 99* 122*  CO2 25 19*  BUN 8 <5*  CREATININE 0.64 0.48  CALCIUM 8.1* 7.8*  GLUCOSE 102* 78   Protein Profile:  Recent Labs Lab 02/02/16 1105  ALBUMIN 2.2*    Gastrointestinal Profile: Last BM:  02/01/2016   Nutrition-Focused Physical Exam Findings: Nutrition-Focused physical exam completed. Findings are no fat depletion, mild muscle depletion, and no edema.   Weight Change:  Pt reported to writer last admission, pt had reported 175lbs before her birthday this year in [birth year]. Pt weight of 154lbs last admission one month ago. Pt reported 137lbs this past [facility] at [facility]. (11% weight loss in 1 month, 22% weight loss in 4 months)   Skin:  Reviewed, no issues   Height:   Ht Readings from Last 1 Encounters:  02/02/16 5\' 5"  (1.651 m)    Weight:   Wt Readings from Last 1 Encounters:  02/02/16 137 lb (62.143 kg)    Wt Readings from Last 10 Encounters:  02/02/16 137 lb (62.143 kg)  01/29/16 137 lb (62.143 kg)  01/28/16 138 lb 12.5 oz (62.95 kg)  01/25/16 137 lb 12.6 oz (62.5 kg)  01/18/16 153 lb 11.2 oz (69.718 kg)  12/31/15 154 lb 6.4 oz (70.035 kg)  12/27/15 148 lb 2.4 oz (67.2 kg)  12/20/15 152 lb 8.9 oz (69.2 kg)  12/13/15 151 lb 10.8 oz (68.8 kg)  12/06/15 155 lb 10.3 oz (70.6 kg)    BMI:  Body mass index is 22.8 kg/(m^2).  Estimated Nutritional Needs:   Kcal:  BEE: 1256kcals, TEE: (IF 1.2-1.4)(AF 1.2) 1822-2126kcals  Protein:  68-80g protein (1.1-1.3g/kg)  Fluid:  1.8-2.1L fluid  EDUCATION NEEDS:   No education needs identified at this time  Dwyane Luo, RD, LDN Pager [phone] Weekend/On-Call Pager [phone]

## 2016-02-04 NOTE — Plan of Care (Addendum)
Problem: Tissue Perfusion: Goal: Risk factors for ineffective tissue perfusion will decrease Outcome: Progressing Pt with complaints of pain, PRN pain meds given with some relief, pt went to cancer center for RT planning but unable to perform r/t inability to lie on the table, family at bedside, Gtube to gravity and draining without difficulty, pt voices any needs

## 2016-02-05 ENCOUNTER — Ambulatory Visit: Admission: RE | Admit: 2016-02-05 | Payer: 59 | Source: Ambulatory Visit

## 2016-02-05 ENCOUNTER — Ambulatory Visit
Admit: 2016-02-05 | Discharge: 2016-02-05 | Disposition: A | Discharge status: Home / Self Care | Payer: 59 | Attending: Radiation Oncology | Admitting: Radiation Oncology

## 2016-02-05 DIAGNOSIS — Z931 Gastrostomy status: Secondary | ICD-10-CM

## 2016-02-05 DIAGNOSIS — R112 Nausea with vomiting, unspecified: Secondary | ICD-10-CM

## 2016-02-05 DIAGNOSIS — R5381 Other malaise: Secondary | ICD-10-CM

## 2016-02-05 DIAGNOSIS — R5383 Other fatigue: Secondary | ICD-10-CM

## 2016-02-05 DIAGNOSIS — C787 Secondary malignant neoplasm of liver and intrahepatic bile duct: Secondary | ICD-10-CM

## 2016-02-05 DIAGNOSIS — R630 Anorexia: Secondary | ICD-10-CM

## 2016-02-05 DIAGNOSIS — R531 Weakness: Secondary | ICD-10-CM

## 2016-02-05 DIAGNOSIS — C185 Malignant neoplasm of splenic flexure: Principal | ICD-10-CM

## 2016-02-05 DIAGNOSIS — M549 Dorsalgia, unspecified: Secondary | ICD-10-CM

## 2016-02-05 DIAGNOSIS — R109 Unspecified abdominal pain: Secondary | ICD-10-CM

## 2016-02-05 DIAGNOSIS — Z923 Personal history of irradiation: Secondary | ICD-10-CM

## 2016-02-05 DIAGNOSIS — Z9221 Personal history of antineoplastic chemotherapy: Secondary | ICD-10-CM

## 2016-02-05 MED ORDER — DOCUSATE SODIUM 100 MG PO CAPS
100.0000 mg | ORAL_CAPSULE | Freq: Two times a day (BID) | ORAL | Status: DC
Start: 2016-02-05 — End: 2016-02-08
  Administered 2016-02-05 – 2016-02-07 (×6): 100 mg via ORAL
  Filled 2016-02-05 (×6): qty 1

## 2016-02-05 MED ORDER — DIPHENHYDRAMINE HCL 50 MG/ML IJ SOLN: 12.5000 mg | Freq: Four times a day (QID) | INTRAMUSCULAR | Status: DC | PRN

## 2016-02-05 MED ORDER — HYDROMORPHONE HCL 1 MG/ML IJ SOLN
2.0000 mg | INTRAMUSCULAR | Status: DC | PRN
Start: 2016-02-05 — End: 2016-02-05
  Administered 2016-02-05: 2 mg via INTRAVENOUS
  Filled 2016-02-05: qty 2

## 2016-02-05 MED ORDER — SODIUM CHLORIDE 0.9% FLUSH
9.0000 mL | INTRAVENOUS | Status: DC | PRN
  Administered 2016-02-07: 9 mL via INTRAVENOUS
  Filled 2016-02-05: qty 9

## 2016-02-05 MED ORDER — HYDROMORPHONE 1 MG/ML IV SOLN: INTRAVENOUS | Status: DC | PRN

## 2016-02-05 MED ORDER — DIPHENHYDRAMINE HCL 12.5 MG/5ML PO ELIX
12.5000 mg | ORAL_SOLUTION | Freq: Four times a day (QID) | ORAL | Status: DC | PRN
  Filled 2016-02-05: qty 5

## 2016-02-05 MED ORDER — NALOXONE HCL 0.4 MG/ML IJ SOLN: 0.4000 mg | INTRAMUSCULAR | Status: DC | PRN

## 2016-02-05 MED ORDER — HYDROMORPHONE 1 MG/ML IV SOLN
INTRAVENOUS | Status: DC | PRN
  Administered 2016-02-05: 14:00:00 via INTRAVENOUS
  Filled 2016-02-05: qty 25

## 2016-02-05 MED ORDER — ONDANSETRON HCL 4 MG/2ML IJ SOLN
4.0000 mg | Freq: Four times a day (QID) | INTRAMUSCULAR | Status: DC | PRN
  Administered 2016-02-11: 4 mg via INTRAVENOUS
  Filled 2016-02-05 (×2): qty 2

## 2016-02-05 NOTE — Progress Notes (Signed)
AVSS. Pain remains major issue: left low back area.  GT drainage high, corresponding to her po intake. Lungs: Clear. Cardio: RR. ABD: Soft. Mild LLQ discomfort. Case reviewed w/ medonc and rad onc last pm. I suspect she will not tolerate adequate po calories, and may benefit from a GJ tube for enteral nutrition.

## 2016-02-05 NOTE — Consult Note (Signed)
[place]  [phone]) [phone] [phone]) [phone]  ID: Tasia Catchings OB: 02-12-1971  MR#: [id number]  [id number]  Patient Care Team: Lynnell Jude, MD as PCP - General (Family Medicine) [name], MD (Oncology) Seeplaputhur [name], MD (General Surgery)  CHIEF COMPLAINT:  Chief Complaint  Patient presents with  . Weakness    INTERVAL HISTORY: Patient is a 45 year old female with known history of stage IV colon cancer who was recently admitted with intractable pain. She also has persistent nausea and vomiting and a poor appetite. She had a recent gastrojejunostomy with insertion of G-tube. Patient was supposed to initiate palliative XRT in the near future, but could not tolerate the simulation secondary to pain. She has no neurologic complaints. She denies any fevers. She has increased weakness and fatigue. Patient offers no further specific complaints.  REVIEW OF SYSTEMS:   Review of Systems  Constitutional: Negative for fever and weight loss.  Respiratory: Negative.  Negative for cough and shortness of breath.   Cardiovascular: Negative.  Negative for chest pain.  Gastrointestinal: Positive for nausea, vomiting and abdominal pain. Negative for diarrhea and constipation.  Genitourinary: Negative.   Musculoskeletal: Positive for back pain.  Neurological: Positive for weakness.  Psychiatric/Behavioral: Negative.     As per HPI. Otherwise, a complete review of systems is negatve.  PAST MEDICAL HISTORY: Past Medical History  Diagnosis Date  . Ulcer   . Anemia   . Obesity   . Constipation   . Migraines   . Cancer ([place]) 2015    colon  . Colon cancer metastasized to liver [facility]) April 2015    T3, N2, M1, stage IV with biopsy-proven liver metastases. On adjuvant chemotherapy    PAST SURGICAL HISTORY: Past Surgical History  Procedure Laterality Date  . Cholecystectomy  2014  . Tubal ligation  2005  . Colonoscopy  2015  . Upper gi  endoscopy  2015  . Esophagogastroduodenoscopy (egd) with propofol N/A 11/19/2015    Procedure: ESOPHAGOGASTRODUODENOSCOPY (EGD) WITH PROPOFOL;  Surgeon: Christene Lye, MD;  Location: [facility] ENDOSCOPY;  Service: Endoscopy;  Laterality: N/A;  . Laparotomy N/A 01/08/2016    Procedure: EXPLORATORY LAPAROTOMY, GASTRIC Jejunostomy, gastrostomy, upper endoscopy;  Surgeon: Robert Bellow, MD;  Location: [facility] ORS;  Service: General;  Laterality: N/A;    FAMILY HISTORY: Reviewed and unchanged.     ADVANCED DIRECTIVES:    HEALTH MAINTENANCE: Social History  Substance Use Topics  . Smoking status: Never Smoker   . Smokeless tobacco: None  . Alcohol Use: No     Colonoscopy:  PAP:  Bone density:  Lipid panel:  Allergies  Allergen Reactions  . Fentanyl Swelling    Swelling of lips  . Oxaliplatin Itching  . Hydrocodone-Acetaminophen Hives  . Vancomycin Hives    Current Facility-Administered Medications  Medication Dose Route Frequency Provider Last Rate Last Dose  . 0.9 % NaCl with KCl 20 mEq/ L  infusion   Intravenous Continuous Robert Bellow, MD 75 mL/hr at 02/04/16 2346    . acetaminophen (TYLENOL) tablet 650 mg  650 mg Oral Q6H PRN Leonie Green, MD       Or  . acetaminophen (TYLENOL) suppository 650 mg  650 mg Rectal Q6H PRN Leonie Green, MD      . diphenhydrAMINE (BENADRYL) injection 12.5 mg  12.5 mg Intravenous Q6H PRN Lloyd Huger, MD       Or  . diphenhydrAMINE (BENADRYL) 12.5 MG/5ML elixir 12.5 mg  12.5 mg Oral  Q6H PRN Lloyd Huger, MD      . docusate sodium (COLACE) capsule 100 mg  100 mg Oral BID Lloyd Huger, MD      . enoxaparin (LOVENOX) injection 40 mg  40 mg Subcutaneous Q24H Leonie Green, MD   40 mg at 02/04/16 2029  . HYDROmorphone (DILAUDID) 1 mg/mL PCA injection   Intravenous Q2H PRN Lloyd Huger, MD      . LORazepam (ATIVAN) injection 0.5 mg  0.5 mg Intravenous Q8H PRN Leonie Green, MD   0.5 mg at  02/05/16 0903  . naloxone [facility]) injection 0.4 mg  0.4 mg Intravenous PRN Lloyd Huger, MD       And  . sodium chloride flush (NS) 0.9 % injection 9 mL  9 mL Intravenous PRN Lloyd Huger, MD      . ondansetron (ZOFRAN-ODT) disintegrating tablet 4 mg  4 mg Oral Q6H PRN Leonie Green, MD       Or  . ondansetron [facility]) injection 4 mg  4 mg Intravenous Q6H PRN Leonie Green, MD      . ondansetron [facility]) injection 4 mg  4 mg Intravenous Q6H PRN Lloyd Huger, MD      . pantoprazole (PROTONIX) injection 40 mg  40 mg Intravenous QHS Leonie Green, MD   40 mg at 02/04/16 2030  . piperacillin-tazobactam (ZOSYN) IVPB 3.375 g  3.375 g Intravenous 3 times per day Leonie Green, MD   3.375 g at 02/05/16 0510  . sodium chloride flush (NS) 0.9 % injection 10-40 mL  10-40 mL Intracatheter PRN Leonie Green, MD      . traZODone (DESYREL) tablet 50 mg  50 mg Oral QHS PRN Henreitta Leber, MD   50 mg at 02/04/16 2232  . zolpidem (AMBIEN) tablet 5 mg  5 mg Oral QHS PRN Lance Coon, MD   5 mg at 02/03/16 2204   Facility-Administered Medications Ordered in Other Encounters  Medication Dose Route Frequency Provider Last Rate Last Dose  . sodium chloride 0.9 % injection 10 mL  10 mL Intracatheter PRN [name], MD   10 mL at 03/21/15 1025  . sodium chloride 0.9 % injection 10 mL  10 mL Intravenous PRN [name], MD   10 mL at 04/21/15 1025  . sodium chloride 0.9 % injection 10 mL  10 mL Intracatheter PRN [name], MD   10 mL at 12/03/15 1105    OBJECTIVE: Filed Vitals:   02/04/16 2044 02/05/16 0509  BP: 100/50 110/52  Pulse: 109   Temp: 99 F (37.2 C) 97.7 F (36.5 C)  Resp: 18 15     Body mass index is 22.8 kg/(m^2).    ECOG FS:1 - Symptomatic but completely ambulatory  General: Well-developed, well-nourished, no acute distress. Eyes: Pink conjunctiva, anicteric sclera. HEENT: Normocephalic, moist mucous membranes, clear oropharnyx. Lungs:  Clear to auscultation bilaterally. Heart: Regular rate and rhythm. No rubs, murmurs, or gallops. Abdomen: Soft, nontender, nondistended. No organomegaly noted, normoactive bowel sounds. G-tube noted. Musculoskeletal: No edema, cyanosis, or clubbing. Neuro: Alert, answering all questions appropriately. Cranial nerves grossly intact. Skin: No rashes or petechiae noted. Psych: Normal affect. Lymphatics: No cervical, calvicular, axillary or inguinal LAD.   LAB RESULTS:  Lab Results  Component Value Date   NA 145 02/04/2016   K 3.5 02/04/2016   CL 122* 02/04/2016   CO2 19* 02/04/2016   GLUCOSE 78 02/04/2016   BUN <5* 02/04/2016  CREATININE 0.48 02/04/2016   CALCIUM 7.8* 02/04/2016   PROT 7.4 02/02/2016   ALBUMIN 2.2* 02/02/2016   AST 28 02/02/2016   ALT 12* 02/02/2016   ALKPHOS 119 02/02/2016   BILITOT 1.1 02/02/2016   GFRNONAA >60 02/04/2016   GFRAA >60 02/04/2016    Lab Results  Component Value Date   WBC 9.6 02/04/2016   NEUTROABS 12.7* 02/02/2016   HGB 7.1* 02/04/2016   HCT 21.8* 02/04/2016   MCV 83.2 02/04/2016   PLT 373 02/04/2016     STUDIES: Dg Abd 1 View  01/10/2016  CLINICAL DATA:  History of metastatic colon cancer and recent laparotomy with gastrojejunostomy. EXAM: ABDOMEN - 1 VIEW COMPARISON:  01/07/2016 FINDINGS: Patient now has a gastrostomy tube. Surgical skin staples along the midline of the abdomen. The oral contrast appears to be within the right colon and transverse colon. There is a nonobstructive bowel gas pattern. Again noted are cholecystectomy clips. IMPRESSION: Postoperative changes with a nonobstructive bowel gas pattern. Electronically Signed   By: Markus Daft M.D.   On: 01/10/2016 10:07   Dg Abd 1 View  01/07/2016  CLINICAL DATA:  Small bowel obstruction EXAM: ABDOMEN - 1 VIEW COMPARISON:  Earlier today FINDINGS: There is barium throughout the ascending colon. There is a small amount of residual barium in the stomach within NG tube. Minimal  barium in the terminal ileum. No disproportionate dilatation of bowel. No obvious free intraperitoneal gas. IMPRESSION: Nonobstructive bowel gas pattern. Barium has trans breast into the colon. NG tube remains in place. Electronically Signed   By: Marybelle Killings M.D.   On: 01/07/2016 20:28   Ct Abdomen Pelvis W Contrast  02/02/2016  CLINICAL DATA:  History of colon carcinoma with abdominal pain and weakness EXAM: CT ABDOMEN AND PELVIS WITH CONTRAST TECHNIQUE: Multidetector CT imaging of the abdomen and pelvis was performed using the standard protocol following bolus administration of intravenous contrast. CONTRAST:  13mL ISOVUE-300 IOPAMIDOL (ISOVUE-300) INJECTION 61% COMPARISON:  01/02/2016 FINDINGS: The lung bases are free of acute infiltrate or sizable effusion. The liver is again diffusely fatty infiltrated with cystic lesions in the left lobe. No definitive metastatic disease is seen. The gallbladder has been surgically removed. The spleen demonstrates some focal areas of decreased enhancement which may represent areas of infarction. The adrenal glands are stable. The pancreas again demonstrates a poorly defined mass lesion which involves the body as well as extension into the posterior aspect of the stomach and fourth portion of the duodenum. The mass lesion measures at least 3.8 by 4.2 cm. Some foci of extraluminal air are noted within the pancreatic mass which suggests the possibility of communication between it and IV the gastric or duodenal lumen. A new gastrostomy catheter is seen. Kidneys demonstrate no renal calculi or obstructive changes. A left renal cyst is seen. The appendix is within normal limits. The bladder is well distended. The uterus and ovaries appear within normal limits. No acute bony abnormality seen. Some metallic densities are noted within the lumen of the ascending colon of uncertain significance. These are new from the prior exam. No acute bony abnormality is noted. Periaortic  retroperitoneal adenopathy is seen with some central necrosis. The largest of these nodes measures approximately 19 mm in greatest dimension. A few small mesenteric lymph nodes are noted as well. 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 There are now foci of air within the mass suggesting a degree of communication with a at  the stomach with the duodenum. Slight increase in size of retroperitoneal lymphadenopathy when compare with the prior exam now measuring approximately 19 mm. Previous largest measurement was 14 mm. The remainder of the exam is stable in appearance. Electronically Signed   By: Inez Catalina M.D.   On: 02/02/2016 15:24   Dg Abd 2 Views  01/15/2016  CLINICAL DATA:  Post gastrojejunostomy for obstruction fourth portion of duodenum. EXAM: ABDOMEN - 2 VIEW COMPARISON:  01/10/2016 FINDINGS: Central venous catheter is seen with tip overlying the cavoatrial junction. Lung bases otherwise within normal. Abdominal images demonstrate surgical clips over the right upper quadrant and left upper quadrant as well as pelvis. Skin staples of present vertically just left of midline. Gastrostomy tube noted over the expected region of the stomach in the left upper quadrant. Contrast is present throughout the colon with minimal diverticular disease noted. Minimal contrast over the midline lower abdomen likely within the distal small bowel. No evidence of obstruction. No free peritoneal air. Remainder of the exam is unchanged. IMPRESSION: Postsurgical changes compatible with gastrojejunostomy with gastrostomy tube over the left upper quadrant unchanged. Contrast throughout the colon without evidence of obstruction. Electronically Signed   By: Marin Olp M.D.   On: 01/15/2016 10:19   Dg Ugi W/small Bowel  01/07/2016  CLINICAL DATA:  45 year old female with metastatic colon cancer. Subsequent encounter. EXAM: UPPER GI SERIES WITH KUB  TECHNIQUE: After obtaining a scout radiograph a routine upper GI series was performed using thin density barium. FLUOROSCOPY TIME:  Radiation Exposure Index (as provided by the fluoroscopic device): 9008.8 micro Gray -meters squared Fluoroscopy Time (in minutes and seconds): 2 minutes and 48 seconds. COMPARISON:  01/02/2016 CT abdomen and pelvis. FINDINGS: After discussion with Dr. Bary Castilla, upper GI series utilizing barium was arranged. Small amount of residual barium within the colon is noted on scout view. Nasogastric tube tip gastric fundus level. Barium was instilled via the nasogastric tube. There was initial holdup of contrast in the gastric fundus/proximal body secondary to a mass infiltrating the posterior aspect of the gastric body. Contrast traversed beyond this region with change in patient position. Contrast enters the duodenum. Change in caliber of the duodenum at the third-fourth portion. 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 Upon review of the prior CT, mass extends into the fourth portion of the duodenum is causing partial obstruction. Contrast traverses beyond this region. Delayed transit time throughout the small bowel. Spot views of the terminal ileum were not obtained after images were reviewed with Dr. Bary Castilla. Nasogastric tube was flushed with water. IMPRESSION: Mass extends into the posterior aspect of the gastric body and the fourth portion of the duodenum contributing to partial obstruction. Please see above. Electronically Signed   By: Genia Del M.D.   On: 01/07/2016 12:56    ASSESSMENT: Stage IV colon cancer with increasing pain, nausea and vomiting.  PLAN:    1. Pain: Appreciate surgical input with no indication for surgery at this time. Patient was supposed to initiate XRT for palliative pain control, but could not tolerate the simulation. Have ordered Dilaudid PCA to attempt better control of her pain. Have also consult  palliative care for further assistance. 2. Intractable nausea and vomiting: Agree with current IV antiemetics. Continue to monitor. Advance diet as tolerated. 3. Colon cancer: Patient has an appointment this coming Friday for further evaluation and potential treatment planning if possible. She has been instructed that if she is discharged prior to  this time to keep his follow-up appointment as scheduled.  Appreciate consult, will follow. Patient's primary oncologist Dr. Oliva Bustard and myself will be out of town until Monday. Please call the on-call physician either Dr. Rogue Bussing or Dr. Mike Gip if you have any further questions.   Lloyd Huger, MD   02/05/2016 1:32 PM

## 2016-02-06 ENCOUNTER — Ambulatory Visit
Admission: RE | Admit: 2016-02-06 | Discharge: 2016-02-06 | Disposition: A | Discharge status: Home / Self Care | Payer: 59 | Source: Ambulatory Visit | Attending: Radiation Oncology | Admitting: Radiation Oncology

## 2016-02-06 ENCOUNTER — Inpatient Hospital Stay: Payer: 59

## 2016-02-06 DIAGNOSIS — C185 Malignant neoplasm of splenic flexure: Secondary | ICD-10-CM | POA: Diagnosis not present

## 2016-02-06 DIAGNOSIS — D649 Anemia, unspecified: Secondary | ICD-10-CM

## 2016-02-06 DIAGNOSIS — K5900 Constipation, unspecified: Secondary | ICD-10-CM

## 2016-02-06 MED ORDER — HYDROMORPHONE 1 MG/ML IV SOLN
INTRAVENOUS | Status: DC
  Administered 2016-02-06: 17:00:00 via INTRAVENOUS
  Administered 2016-02-07: 1 mg via INTRAVENOUS
  Administered 2016-02-08 – 2016-02-10 (×2): via INTRAVENOUS
  Filled 2016-02-06 (×4): qty 25

## 2016-02-06 NOTE — Clinical Documentation Improvement (Signed)
General Surgery  Can the diagnosis of Malnutrition be further specified?   Document Severity - Severe(third degree), Moderate (second degree), Mild (first degree)  Other condition  Unable to clinically determine  Document any associated diagnoses/conditions  Supporting Information:  ED note: appears to have some failure to thrive secondary to colon cancer  Current outpatient Rx: Megace BID 02/04/16 Nutritional Management: Severe malnutrition , energy intake < or equal to 75% for > or equal to 1 month, percent weight loss, mild depletion of muscle mass.   Please exercise your independent, professional judgment when responding. A specific answer is not anticipated or expected. Please update your documentation within the medical record to reflect your response to this query. Thank you  Thank You, [place] [phone]

## 2016-02-06 NOTE — Progress Notes (Signed)
Misty Mack   DOB:07/21/1971   EL#:[id number]    Subjective: Patient's pain and back has improved since being on the continuous Dilaudid PCA. She was able to rest well overnight. She is planning to go for radiation this morning.  ROS: Constipation. Poor appetite. No nausea no vomiting.  Objective:  Filed Vitals:   02/06/16 1200 02/06/16 1224  BP:    Pulse:    Temp:    Resp: 14 14     Intake/Output Summary (Last 24 hours) at 02/06/16 1643 Last data filed at 02/06/16 1535  Gross per 24 hour  Intake    360 ml  Output    575 ml  Net   -215 ml    GENERAL:alert, no distress; Moderately built moderately nourished. She is accompanied by her aunt. She is sitting with edge of the bed/having breakfast. EYES: Positive for pallor; no icterus. OROPHARYNX: No thrush ulceration. NECK: supple, no lymphadenopathy. LYMPH:  no palpable lymphadenopathy in the cervical, axillary or inguinal LUNGS: Decreased breath sounds bilaterally to bases. No visible crackles HEART: regular rate & rhythm and no murmurs and no lower extremity edema ABDOMEN:abdomen soft, non-tender and normal bowel sounds; positive for J-tube. Mild tenderness present. Musculoskeletal:no cyanosis of digits and no clubbing  NEURO: alert & oriented x 3 with fluent speech, no focal motor/sensory deficits   Labs:  Lab Results  Component Value Date   WBC 9.6 02/04/2016   HGB 7.1* 02/04/2016   HCT 21.8* 02/04/2016   MCV 83.2 02/04/2016   PLT 373 02/04/2016   NEUTROABS 12.7* 02/02/2016    Lab Results  Component Value Date   NA 145 02/04/2016   K 3.5 02/04/2016   CL 122* 02/04/2016   CO2 19* 02/04/2016    Studies:  Dg Abd 1 View  02/06/2016  CLINICAL DATA:  Small bowel obstruction EXAM: ABDOMEN - 1 VIEW COMPARISON:  02/02/2016 FINDINGS: There is normal small bowel gas pattern. Some colonic gas noted in hepatic flexure of the colon. Postcholecystectomy surgical clips are noted. Partially visualized percutaneous gastrostomy  tube. There is left subclavian Port-A-Cath with tip in distal SVC. No evidence of free abdominal air. Surgical clips are noted in left abdomen. IMPRESSION: There is normal small bowel gas pattern. Some colonic gas noted in hepatic flexure of the colon. Postcholecystectomy surgical clips are noted. Partially visualized percutaneous gastrostomy tube. There is left subclavian Port-A-Cath with tip in distal SVC. No evidence of free abdominal air. Electronically Signed   By: Lahoma Crocker M.D.   On: 02/06/2016 11:36    Assessment & Plan:   # Intractable pain/Mid back- question involvement of the celiac plexus with the retroperitoneal mass/metastatic disease. Some improvement noted on the Dilaudid continuous PCA. Continue radiation.   # Metastatic colon cancer- K-ras mutated/ status post progression on FOLFOX with Avastin; and also FOLFIRI; s/p progression on TAS-102; MSI-STABLE. Would look into [facility] on testing- for possible actionable mutations.  # Hemoglobin 7.1- multifactorial. Will check CBC tomorrow- if continues to be around 7 recommend 2 units of PRBC transfusion.    Cammie Sickle, MD 02/06/2016  4:43 PM

## 2016-02-06 NOTE — Progress Notes (Signed)
Afebrile,mild tachycardia, BP low normal.  Able to tolerate radiation treatment today. Plain film of abdomen did not show a distended stomach. Lungs: Clear. Cardio: RR. ABD: Non-distended. GT drainage : 500 cc today. Reclamped GT tube. No BM. Pain under better control w/ PCA, but falling asleep during meals. Discussed plans for replacement of gastrostomy w/ gastrojejunostomy tube on Friday for enteral nutrition.

## 2016-02-07 ENCOUNTER — Ambulatory Visit
Admission: RE | Admit: 2016-02-07 | Discharge: 2016-02-07 | Disposition: A | Discharge status: Home / Self Care | Payer: 59 | Source: Ambulatory Visit | Attending: Radiation Oncology | Admitting: Radiation Oncology

## 2016-02-07 DIAGNOSIS — C185 Malignant neoplasm of splenic flexure: Secondary | ICD-10-CM | POA: Diagnosis not present

## 2016-02-07 DIAGNOSIS — C7889 Secondary malignant neoplasm of other digestive organs: Secondary | ICD-10-CM

## 2016-02-07 DIAGNOSIS — Z515 Encounter for palliative care: Secondary | ICD-10-CM

## 2016-02-07 DIAGNOSIS — G893 Neoplasm related pain (acute) (chronic): Secondary | ICD-10-CM

## 2016-02-07 DIAGNOSIS — E876 Hypokalemia: Secondary | ICD-10-CM

## 2016-02-07 DIAGNOSIS — E46 Unspecified protein-calorie malnutrition: Secondary | ICD-10-CM

## 2016-02-07 DIAGNOSIS — R197 Diarrhea, unspecified: Secondary | ICD-10-CM

## 2016-02-07 LAB — CULTURE, BLOOD (ROUTINE X 2)
CULTURE: NO GROWTH
CULTURE: NO GROWTH

## 2016-02-07 LAB — CBC
HEMATOCRIT: 19.2 % — AB (ref 35.0–47.0)
HEMOGLOBIN: 6.3 g/dL — AB (ref 12.0–16.0)
MCH: 26.3 pg (ref 26.0–34.0)
MCHC: 32.6 g/dL (ref 32.0–36.0)
MCV: 80.7 fL (ref 80.0–100.0)
Platelets: 262 10*3/uL (ref 150–440)
RBC: 2.39 MIL/uL — ABNORMAL LOW (ref 3.80–5.20)
RDW: 24.3 % — ABNORMAL HIGH (ref 11.5–14.5)
WBC: 5.2 10*3/uL (ref 3.6–11.0)

## 2016-02-07 LAB — PREPARE RBC (CROSSMATCH)

## 2016-02-07 MED ORDER — ENOXAPARIN SODIUM 40 MG/0.4ML ~~LOC~~ SOLN: 40.0000 mg | SUBCUTANEOUS | Status: DC

## 2016-02-07 MED ORDER — SODIUM CHLORIDE 0.9 % IV SOLN
Freq: Once | INTRAVENOUS | Status: AC
  Administered 2016-02-07: 12:00:00 via INTRAVENOUS

## 2016-02-07 MED ORDER — FUROSEMIDE 10 MG/ML IJ SOLN
20.0000 mg | Freq: Once | INTRAMUSCULAR | Status: AC
  Administered 2016-02-07: 20 mg via INTRAVENOUS
  Filled 2016-02-07: qty 2

## 2016-02-07 NOTE — Plan of Care (Signed)
Talked to Dr. Bary Castilla abt Hgb of 6.3 - he ordered 2 u PRBC.  Pt will be having GJ tube replacement tomorrow.  Will be NPO after MN and will hook up Gtube to gravity drainage tonight.

## 2016-02-07 NOTE — Progress Notes (Signed)
Misty Mack   DOB:[birth year]   VF#:[id number]    Subjective: Significant improvement in the pain noted on continuous Dilaudid PCA.She was able to rest well overnight. She started radiation yesterday. No blood in stools. Denies being drowsy or lightheaded.  ROS: Constipation. Poor appetite. No nausea no vomiting.  Objective:  Filed Vitals:   02/07/16 0433 02/07/16 0800  BP: 93/50   Pulse: 98   Temp: 97.9 F (36.6 C)   Resp: 18 12     Intake/Output Summary (Last 24 hours) at 02/07/16 0835 Last data filed at 02/07/16 0815  Gross per 24 hour  Intake    360 ml  Output   2225 ml  Net  -1865 ml    GENERAL:alert, no distress; Moderately built moderately nourished. She is accompanied by her aunt. She is sitting with edge of the bed/having breakfast. EYES: Positive for pallor; no icterus. OROPHARYNX: No thrush ulceration. NECK: supple, no lymphadenopathy. LYMPH:  no palpable lymphadenopathy in the cervical, axillary or inguinal LUNGS: Decreased breath sounds bilaterally to bases. No visible crackles HEART: regular rate & rhythm and no murmurs and no lower extremity edema ABDOMEN:abdomen soft, non-tender and normal bowel sounds; positive for J-tube. Mild tenderness present. Musculoskeletal:no cyanosis of digits and no clubbing  NEURO: alert & oriented x 3 with fluent speech, no focal motor/sensory deficits   Labs:  Lab Results  Component Value Date   WBC 5.2 02/07/2016   HGB 6.3* 02/07/2016   HCT 19.2* 02/07/2016   MCV 80.7 02/07/2016   PLT 262 02/07/2016   NEUTROABS 12.7* 02/02/2016    Lab Results  Component Value Date   NA 145 02/04/2016   K 3.5 02/04/2016   CL 122* 02/04/2016   CO2 19* 02/04/2016    Studies:  Dg Abd 1 View  02/06/2016  CLINICAL DATA:  Small bowel obstruction EXAM: ABDOMEN - 1 VIEW COMPARISON:  02/02/2016 FINDINGS: There is normal small bowel gas pattern. Some colonic gas noted in hepatic flexure of the colon. Postcholecystectomy surgical clips are  noted. Partially visualized percutaneous gastrostomy tube. There is left subclavian Port-A-Cath with tip in distal SVC. No evidence of free abdominal air. Surgical clips are noted in left abdomen. IMPRESSION: There is normal small bowel gas pattern. Some colonic gas noted in hepatic flexure of the colon. Postcholecystectomy surgical clips are noted. Partially visualized percutaneous gastrostomy tube. There is left subclavian Port-A-Cath with tip in distal SVC. No evidence of free abdominal air. Electronically Signed   By: Lahoma Crocker M.D.   On: 02/06/2016 11:36    Assessment & Plan:   # Intractable pain/Mid back- question involvement of the celiac plexus with the retroperitoneal mass/metastatic disease. Improvement noted on Dilaudid PCA- currently on 0.5 mg continuous infusion/0.25 on demand. Patient received a total of 4 mg over the last 4 hours. Patient started radiation yesterday. I will try to go down on the continuous infusion tomorrow/and adjust her pain medication accordingly.  # Metastatic colon cancer- K-ras mutated/ status post progression on FOLFOX with Avastin; and also FOLFIRI; s/p progression on TAS-102; MSI-STABLE. Would look into [facility] on testing- for possible actionable mutations.  # Hemoglobin 6.3 multifactorial; I do not suspect any active bleeding. Recommend 2 units of PRBC transfusion.   # Malnutrition/poor by mouth intake. Plan for PEJ tube placement tomorrow.  Overall prognosis is poor. Discussed with Dr.Byrnett.   Cammie Sickle, MD 02/07/2016  8:34 AM

## 2016-02-07 NOTE — Progress Notes (Signed)
Afebrile. Mild hypotension, likely secondary to IV dilaudid, asymptomatic. Much better pain control. Reviewed plans for GJ replacement tomorrow. TX 2 units PRBC based on HGB 6.1 and pending surgery and unlikely spontaneous rebound w/ present cancer.

## 2016-02-07 NOTE — Consult Note (Signed)
Palliative Medicine Inpatient Consult Note   Name: Misty Mack Date: 02/07/2016 MRN: [id number]  DOB: Jul 12, 1971  Referring Physician: Leonie Green, MD  Palliative Care consult requested for this 45 y.o. female for goals of medical therapy in patient with metastatic colon cancer and uncontrolled pain.  TODAY'S DISCUSSIONS AND DECISIONS:  I met with pt and her mother and discussed pain mgmt and also code status.  Pts pain is currently managed well with current dilaudid PCA orders. Will not change this until after her procedure tomorrow and will only do so after talking with her other physicians caring for her.  Will also talk with pt.  She did not have good relief of her pain at home just prior to coming in.  I talked about code status in a matter-of-fact manner and noted that pt's mother was making quite a few facial expressions that indicated to me that she herself would not want her daughter to choose ongoing full code status.  But. [t wishes to think about this --so I will bring it up again at another time. She did not seem uncomfortable with the subject matter, but seemed to be thinking about possibly dying for the first time and realizing that she needs to address this realistically.    I plan to check on pt following pcdre tomorrow.    PERTINENT HISTORY: Pt is a 45 yo woman who was diagnosed with Stage !V colon cancer. She first presented with weight loss and abdominal pain in March of 2015. A CT showed a mass in the distal transverse colon.  CEA was 9.4.  She had a resection of the tumor with margins positive in April of 2015. She also had a biopsy proven metastatic lesion to her liver.  She had changes in her chemotherapeutic regimen due to some hives. She finished 12 cycles of chemo in the fall of 2015.  She was then started on maintenance chemo.  Her oncologist is Dr. Oliva Bustard.    She was found recently to have a large retroperitoneal mass with associated obstruction of  distal stomach, distal duodenum and proximal jejunum.  She had a recent Gastrojejunostomy and insertion of a gastrostomy tube.   She was to start palliative radiation to reduce the size of this mass and treat the local mets involving the pancreas.  She has had 5-FU by continuous infusion also.    She has had poor oral intake but does try to sip on liquids.  She has had nausea and some vomiting.  She has small loose BMs.    She was admitted on 4/8 and a CT shoed fluid levels in stomach wth a pancreatic mass along with some air bubbles next to mass.  She was started on Zosyn and also vanco but due to hives, the vanco was [facility]. She has been mildly short of breath and has been on oxygen.  She has a Port-A-Cath in left subclavian.  Pain is in the center of her back.    She was started on continuous Dilaudid PCA and pain is lessened with this.  She has gone to radiation from here for the last two days (her first two days for this series).  PCA is delivering 0.5 mg continuously and 0.25 mg on demand --she got 4 mg over 4 hours as of 8:30 this am.  Hemoglobin of 6.3 was noted and she was transfused 2 units.  She is to get a GJ tube replacement tomorrow.    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  Pain is felt in back and is related most likely to metastatic disease to stomach.  She has lost a lot of weight. She has no appetite.   SPIRITUAL SUPPORT SYSTEM:  Yes.  SOCIAL HISTORY:  reports that she has never smoked. She does not have any smokeless tobacco history on file. She reports that she does not drink alcohol or use illicit drugs.  Pt has a 45 yr old in college and an 45 yr old at home.   LEGAL DOCUMENTS:  None (but one old note says pt 'does' have advanced directives.   CODE STATUS: Full code  PAST MEDICAL HISTORY: Past Medical History  Diagnosis Date  . Ulcer   . Anemia   . Obesity   . Constipation   . Migraines   . Cancer ([place]) 2015    colon  . Colon cancer metastasized to liver [facility]) April 2015    T3, N2, M1, stage IV with biopsy-proven liver metastases. On adjuvant chemotherapy    PAST SURGICAL HISTORY:  Past Surgical History  Procedure Laterality Date  . Cholecystectomy  2014  . Tubal ligation  2005  . Colonoscopy  2015  . Upper gi endoscopy  2015  . Esophagogastroduodenoscopy (egd) with propofol N/A 11/19/2015    Procedure: ESOPHAGOGASTRODUODENOSCOPY (EGD) WITH PROPOFOL;  Surgeon: Christene Lye, MD;  Location: [facility] ENDOSCOPY;  Service: Endoscopy;  Laterality: N/A;  . Laparotomy N/A 01/08/2016    Procedure: EXPLORATORY LAPAROTOMY, GASTRIC Jejunostomy, gastrostomy, upper endoscopy;  Surgeon: Robert Bellow, MD;  Location: [facility] ORS;  Service: General;  Laterality: N/A;    ALLERGIES:  is allergic to fentanyl; oxaliplatin; hydrocodone-acetaminophen; and vancomycin.  MEDICATIONS:  Current Facility-Administered Medications  Medication Dose Route Frequency Provider Last Rate Last Dose  . 0.9 % NaCl with KCl 20 mEq/ L  infusion   Intravenous Continuous Robert Bellow, MD 75 mL/hr at 02/07/16 0517    . acetaminophen (TYLENOL) tablet 650 mg  650 mg Oral Q6H PRN Leonie Green, MD       Or  . acetaminophen (TYLENOL) suppository 650 mg  650 mg Rectal Q6H PRN Leonie Green, MD      . diphenhydrAMINE (BENADRYL) injection 12.5 mg  12.5 mg Intravenous Q6H PRN Lloyd Huger, MD       Or  .  diphenhydrAMINE (BENADRYL) 12.5 MG/5ML elixir 12.5 mg  12.5 mg Oral Q6H PRN Lloyd Huger, MD      . docusate sodium (COLACE) capsule 100 mg  100 mg Oral BID Lloyd Huger, MD   100 mg at 02/07/16 0842  . [START ON 02/08/2016] enoxaparin (LOVENOX) injection 40 mg  40 mg Subcutaneous Q24H Robert Bellow, MD      . HYDROmorphone (DILAUDID) 1 mg/mL PCA injection   Intravenous 6 times per day Leonie Green, MD      . LORazepam (ATIVAN) injection 0.5 mg  0.5 mg Intravenous Q8H PRN Leonie Green, MD   0.5 mg at  02/07/16 0842  . naloxone (NARCAN) injection 0.4 mg  0.4 mg Intravenous PRN Lloyd Huger, MD       And  . sodium chloride flush (NS) 0.9 % injection 9 mL  9 mL Intravenous PRN Lloyd Huger, MD      . ondansetron (ZOFRAN-ODT) disintegrating tablet 4 mg  4 mg Oral Q6H PRN Leonie Green, MD       Or  . ondansetron [facility]) injection 4 mg  4 mg Intravenous Q6H PRN Leonie Green, MD      . ondansetron [facility]) injection 4 mg  4 mg Intravenous Q6H PRN Lloyd Huger, MD      . pantoprazole (PROTONIX) injection 40 mg  40 mg Intravenous QHS Leonie Green, MD   40 mg at 02/06/16 2145  . piperacillin-tazobactam (ZOSYN) IVPB 3.375 g  3.375 g Intravenous 3 times per day Leonie Green, MD   3.375 g at 02/07/16 1338  . sodium chloride flush (NS) 0.9 % injection 10-40 mL  10-40 mL Intracatheter PRN Leonie Green, MD      . traZODone (DESYREL) tablet 50 mg  50 mg Oral QHS PRN Henreitta Leber, MD   50 mg at 02/05/16 2217  . zolpidem (AMBIEN) tablet 5 mg  5 mg Oral QHS PRN Lance Coon, MD   5 mg at 02/03/16 2204   Facility-Administered Medications Ordered in Other Encounters  Medication Dose Route Frequency Provider Last Rate Last Dose  . sodium chloride 0.9 % injection 10 mL  10 mL Intracatheter PRN [name], MD   10 mL at 03/21/15 1025  . sodium chloride 0.9 % injection 10 mL  10 mL Intravenous PRN [name], MD   10 mL at 04/21/15  1025  . sodium chloride 0.9 % injection 10 mL  10 mL Intracatheter PRN [name], MD   10 mL at 12/03/15 1105    Vital Signs: BP 91/49 mmHg  Pulse 89  Temp(Src) 97.9 F (36.6 C) (Oral)  Resp 16  Ht '5\' 5"'$  (1.651 m)  Wt 62.143 kg (137 lb)  BMI 22.80 kg/m2  SpO2 100%  LMP 01/02/2016 (Approximate) Filed Weights   02/02/16 1034  Weight: 62.143 kg (137 lb)    Estimated body mass index is 22.8 kg/(m^2) as calculated from the following:   Height as of this encounter: '5\' 5"'$  (1.651 m).   Weight as of this encounter: 62.143 kg (137 lb).  PERFORMANCE STATUS (ECOG) : 4 - Bedbound  PHYSICAL EXAM: Lying in medical bed with a PCA pump going (Dilaudid) EOMI OP clear Neck no JVD or TM Hrt rrr no m Lungs cta  Abd soft with soft BS Ext no cyanosis or mottling   LABS: CBC:    Component Value Date/Time   WBC 5.2 02/07/2016 0509   WBC 4.5 02/22/2015 0834   HGB 6.3* 02/07/2016 0509   HGB 13.6 02/22/2015 0834   HCT 19.2* 02/07/2016 0509   HCT 40.3 02/22/2015 0834   PLT 262 02/07/2016 0509   PLT 185 02/22/2015 0834   MCV 80.7 02/07/2016 0509   MCV 90 02/22/2015 0834   NEUTROABS 12.7* 02/02/2016 1105   NEUTROABS 3.8 02/22/2015 0834   LYMPHSABS 2.4 02/02/2016 1105   LYMPHSABS 0.7* 02/22/2015 0834   MONOABS 1.4* 02/02/2016 1105   MONOABS 0.1* 02/22/2015 0834   EOSABS 0.1 02/02/2016 1105   EOSABS 0.0 02/22/2015 0834   BASOSABS 0.0 02/02/2016 1105   BASOSABS 0.0 02/22/2015 0834   Comprehensive Metabolic  Panel:    Component Value Date/Time   NA 145 02/04/2016 0500   NA 134* 02/22/2015 0834   K 3.5 02/04/2016 0500   K 4.3 02/22/2015 0834   CL 122* 02/04/2016 0500   CL 109 02/22/2015 0834   CO2 19* 02/04/2016 0500   CO2 22 02/22/2015 0834   BUN <5* 02/04/2016 0500   BUN 9 02/22/2015 0834   CREATININE 0.48 02/04/2016 0500   CREATININE 0.56 02/22/2015 0834   GLUCOSE 78 02/04/2016 0500   GLUCOSE 178* 02/22/2015 0834   CALCIUM 7.8* 02/04/2016 0500   CALCIUM 9.0 02/22/2015  0834   AST 28 02/02/2016 1105   AST 24 02/22/2015 0834   ALT 12* 02/02/2016 1105   ALT 13* 02/22/2015 0834   ALKPHOS 119 02/02/2016 1105   ALKPHOS 69 02/22/2015 0834   BILITOT 1.1 02/02/2016 1105   BILITOT 0.5 02/22/2015 0834   PROT 7.4 02/02/2016 1105   PROT 7.5 02/22/2015 0834   ALBUMIN 2.2* 02/02/2016 1105   ALBUMIN 3.7 02/22/2015 0834    More than 50% of the visit was spent in counseling/coordination of care: Yes  Time Spent:  80 minutes

## 2016-02-08 ENCOUNTER — Encounter
Admission: EM | Disposition: A | Discharge status: Home / Self Care | Payer: Self-pay | Source: Home / Self Care | Attending: Surgery

## 2016-02-08 ENCOUNTER — Ambulatory Visit
Admit: 2016-02-08 | Discharge: 2016-02-08 | Disposition: A | Discharge status: Home / Self Care | Payer: 59 | Attending: Radiation Oncology | Admitting: Radiation Oncology

## 2016-02-08 ENCOUNTER — Inpatient Hospital Stay: Payer: 59 | Admitting: Hematology and Oncology

## 2016-02-08 ENCOUNTER — Inpatient Hospital Stay: Payer: 59

## 2016-02-08 ENCOUNTER — Inpatient Hospital Stay: Payer: 59 | Admitting: Certified Registered Nurse Anesthetist

## 2016-02-08 ENCOUNTER — Ambulatory Visit: Payer: 59

## 2016-02-08 DIAGNOSIS — K311 Adult hypertrophic pyloric stenosis: Secondary | ICD-10-CM

## 2016-02-08 HISTORY — PX: PEG PLACEMENT: SHX5437

## 2016-02-08 HISTORY — PX: ESOPHAGOGASTRODUODENOSCOPY (EGD) WITH PROPOFOL: SHX5813

## 2016-02-08 LAB — TYPE AND SCREEN
ABO/RH(D): O POS
Antibody Screen: NEGATIVE
UNIT DIVISION: 0
UNIT DIVISION: 0

## 2016-02-08 LAB — HEMOGLOBIN AND HEMATOCRIT, BLOOD
HEMATOCRIT: 27.7 % — AB (ref 35.0–47.0)
Hemoglobin: 9.2 g/dL — ABNORMAL LOW (ref 12.0–16.0)

## 2016-02-08 SURGERY — ENDOSCOPY, UPPER GI TRACT
Anesthesia: Choice

## 2016-02-08 SURGERY — ESOPHAGOGASTRODUODENOSCOPY (EGD) WITH PROPOFOL
Anesthesia: General

## 2016-02-08 MED ORDER — SODIUM CHLORIDE 0.9 % IV SOLN
INTRAVENOUS | Status: DC | PRN
  Administered 2016-02-08: 11:00:00 via INTRAVENOUS

## 2016-02-08 MED ORDER — LIDOCAINE HCL (CARDIAC) 20 MG/ML IV SOLN
INTRAVENOUS | Status: DC | PRN
  Administered 2016-02-08: 100 mg via INTRAVENOUS

## 2016-02-08 MED ORDER — MORPHINE SULFATE (PF) 0.5 MG/ML IJ SOLN
INTRAMUSCULAR | Status: DC | PRN
  Administered 2016-02-08: 1 mg via EPIDURAL

## 2016-02-08 MED ORDER — ONDANSETRON HCL 4 MG/2ML IJ SOLN
INTRAMUSCULAR | Status: DC | PRN
  Administered 2016-02-08 (×2): 4 mg via INTRAVENOUS

## 2016-02-08 MED ORDER — FENTANYL CITRATE (PF) 100 MCG/2ML IJ SOLN: 25.0000 ug | INTRAMUSCULAR | Status: DC | PRN

## 2016-02-08 MED ORDER — DEXAMETHASONE SODIUM PHOSPHATE 10 MG/ML IJ SOLN
INTRAMUSCULAR | Status: DC | PRN
  Administered 2016-02-08: 10 mg via INTRAVENOUS

## 2016-02-08 MED ORDER — PHENYLEPHRINE HCL 10 MG/ML IJ SOLN
INTRAMUSCULAR | Status: DC | PRN
  Administered 2016-02-08 (×4): 100 ug via INTRAVENOUS
  Administered 2016-02-08: 200 ug via INTRAVENOUS
  Administered 2016-02-08 (×8): 100 ug via INTRAVENOUS

## 2016-02-08 MED ORDER — EPHEDRINE SULFATE 50 MG/ML IJ SOLN
INTRAMUSCULAR | Status: DC | PRN
  Administered 2016-02-08: 10 mg via INTRAVENOUS

## 2016-02-08 MED ORDER — ONDANSETRON HCL 4 MG/2ML IJ SOLN: 4.0000 mg | Freq: Once | INTRAMUSCULAR | Status: DC | PRN

## 2016-02-08 MED ORDER — PROPOFOL 10 MG/ML IV BOLUS
INTRAVENOUS | Status: DC | PRN
  Administered 2016-02-08: 50 mg via INTRAVENOUS
  Administered 2016-02-08: 150 mg via INTRAVENOUS

## 2016-02-08 MED ORDER — SUCCINYLCHOLINE CHLORIDE 20 MG/ML IJ SOLN
INTRAMUSCULAR | Status: DC | PRN
  Administered 2016-02-08: 100 mg via INTRAVENOUS

## 2016-02-08 NOTE — Op Note (Signed)
[facility] Patient Name: Misty Mack Procedure Date: 02/08/2016 11:21 AM MRN: [id number] Account #: [id number] Date of Birth: 05/07/1971 Admit Type: Inpatient Age: 45 Room: [facility] ENDO ROOM 4 Gender: Female Note Status: Finalized Procedure:            Upper GI endoscopy Indications:          Epigastric abdominal pain Providers:            Robert Bellow, MD Referring MD:         Lynnell Jude (Referring MD) Medicines:            General Anesthesia Complications:        No immediate complications. Procedure:            Pre-Anesthesia Assessment:                       - Prior to the procedure, a History and Physical was                        performed, and patient medications, allergies and                        sensitivities were reviewed. The patient's tolerance of                        previous anesthesia was reviewed.                       - The risks and benefits of the procedure and the                        sedation options and risks were discussed with the                        patient. All questions were answered and informed                        consent was obtained.                       After obtaining informed consent, the endoscope was                        passed under direct vision. Throughout the procedure,                        the patient's blood pressure, pulse, and oxygen                        saturations were monitored continuously. The Endoscope                        was introduced through the mouth, and advanced to the                        operative stoma of duodenum. The upper GI endoscopy was                        difficult seconday to post operative. The patient  tolerated the procedure well. Findings:      The esophagus was normal.      Unable to cannulate the efferent limb of he gastrojejunostomy.      Gastrostomy tube replaced. Impression:           - Normal esophagus.                   - No specimens collected. Recommendation:       - Return patient to hospital ward for ongoing care.                       - NPO today. Procedure Code(s):    --- Professional ---                       [id number], Esophagogastroduodenoscopy, flexible, transoral;                        diagnostic, including collection of specimen(s) by                        brushing or washing, when performed (separate procedure) Diagnosis Code(s):    --- Professional ---                       R10.13, Epigastric pain CPT copyright 2016 American Medical Association. All rights reserved. The codes documented in this report are preliminary and upon coder review may  be revised to meet current compliance requirements. Robert Bellow, MD 02/08/2016 1:04:26 PM This report has been signed electronically. Number of Addenda: 0 Note Initiated On: 02/08/2016 11:21 AM      [facility]

## 2016-02-08 NOTE — Progress Notes (Signed)
Nutrition Follow-up  DOCUMENTATION CODES:   Severe malnutrition in context of chronic illness  INTERVENTION:   -RD aware pt scheduled for replacement of gastrostomy tube with gastrojejunal tube for EN today per MD note.  Will make further recommendations of EN s/p placement of tube and surgical note.    NUTRITION DIAGNOSIS:   Malnutrition related to chronic illness, cancer and cancer related treatments as evidenced by energy intake < or equal to 75% for > or equal to 1 month, percent weight loss, mild depletion of muscle mass.  GOAL:   Patient will meet greater than or equal to 90% of their needs  MONITOR:   Diet advancement, Labs, Weight trends, I & O's  REASON FOR ASSESSMENT:   Malnutrition Screening Tool    ASSESSMENT:   Pt admitted with weakness. Pt with h/o colon cancer with mets to liver, lymph nodes with pancreatic lass. Pt s/p gastrojejunostomy and placement of a gastrostomy tube set to suction currently. Pt scheduled for radiation therapy.  Pt scheduled for gastrostomy tube replacement with G-J tube today after radiation therapy. Pt s/p 2 units of pRBC yesterday.   Diet Order:  Diet NPO time specified   Pt NPO since midnight for procedure.   Gastrointestinal Profile: Last BM: 02/01/2016  Medications: Colace, Protonix, NS with KCl at 8mL/hr  Labs: Hgb 9.2 this am s/p transfusions   Per RN Stanton Kidney, weight collected this am using standing weight. Weight Trend since Admission: Filed Weights   02/02/16 1034 02/08/16 0900  Weight: 137 lb (62.143 kg) 143 lb 6.4 oz (65.046 kg)     Skin:  Reviewed, no issues   BMI:  Body mass index is 23.86 kg/(m^2).  Estimated Nutritional Needs:   Kcal:  BEE: 1256kcals, TEE: (IF 1.2-1.4)(AF 1.2) 1822-2126kcals  Protein:  68-80g protein (1.1-1.3g/kg)  Fluid:  1.8-2.1L fluid  EDUCATION NEEDS:   No education needs identified at this time  Dwyane Luo, RD, LDN Pager [phone] Weekend/On-Call Pager [phone]

## 2016-02-08 NOTE — Anesthesia Preprocedure Evaluation (Addendum)
Anesthesia Evaluation  Patient identified by MRN, date of birth, ID band Patient awake    Reviewed: Allergy & Precautions, NPO status , Patient's Chart, lab work & pertinent test results  History of Anesthesia Complications Negative for: history of anesthetic complications  Airway Mallampati: III       Dental   Pulmonary neg pulmonary ROS,           Cardiovascular negative cardio ROS       Neuro/Psych negative neurological ROS     GI/Hepatic negative GI ROS, Neg liver ROS,   Endo/Other  negative endocrine ROS  Renal/GU negative Renal ROS     Musculoskeletal   Abdominal   Peds  Hematology  (+) anemia ,   Anesthesia Other Findings   Reproductive/Obstetrics                            Anesthesia Physical Anesthesia Plan  ASA: III and emergent  Anesthesia Plan: General   Post-op Pain Management:    Induction: Intravenous  Airway Management Planned: Oral ETT  Additional Equipment:   Intra-op Plan:   Post-operative Plan:   Informed Consent: I have reviewed the patients History and Physical, chart, labs and discussed the procedure including the risks, benefits and alternatives for the proposed anesthesia with the patient or authorized representative who has indicated his/her understanding and acceptance.     Plan Discussed with:   Anesthesia Plan Comments:         Anesthesia Quick Evaluation

## 2016-02-08 NOTE — Anesthesia Postprocedure Evaluation (Signed)
Anesthesia Post Note  Patient: Misty Mack  Procedure(s) Performed: Procedure(s) (LRB): ESOPHAGOGASTRODUODENOSCOPY (EGD) WITH PROPOFOL (N/A) PERCUTANEOUS ENDOSCOPIC GASTROSTOMY (PEG) REPLACEMENT (N/A)  Patient location during evaluation: PACU Anesthesia Type: General Level of consciousness: awake and alert Pain management: pain level controlled Vital Signs Assessment: post-procedure vital signs reviewed and stable Respiratory status: spontaneous breathing and respiratory function stable Cardiovascular status: stable Anesthetic complications: no    Last Vitals:  Filed Vitals:   02/08/16 1437 02/08/16 1439  BP: 101/43   Pulse: 84   Temp: 36.4 C   Resp: 18 16    Last Pain:  Filed Vitals:   02/08/16 1445  PainSc: 5                  Dillin Lofgren K

## 2016-02-08 NOTE — Anesthesia Procedure Notes (Signed)
Procedure Name: Intubation Performed by: Demetrius Charity Pre-anesthesia Checklist: Patient identified, Patient being monitored, Timeout performed, Emergency Drugs available and Suction available Patient Re-evaluated:Patient Re-evaluated prior to inductionOxygen Delivery Method: Circle system utilized Preoxygenation: Pre-oxygenation with 100% oxygen Intubation Type: IV induction, Cricoid Pressure applied and Rapid sequence Laryngoscope Size: Mac and 3 Grade View: Grade I Tube type: Oral Tube size: 7.0 mm Number of attempts: 1 Airway Equipment and Method: Stylet Placement Confirmation: ETT inserted through vocal cords under direct vision,  positive ETCO2 and breath sounds checked- equal and bilateral Secured at: 21 cm Tube secured with: Tape Dental Injury: Teeth and Oropharynx as per pre-operative assessment

## 2016-02-08 NOTE — Transfer of Care (Signed)
Immediate Anesthesia Transfer of Care Note  Patient: Misty Mack  Procedure(s) Performed: Procedure(s): ESOPHAGOGASTRODUODENOSCOPY (EGD) WITH PROPOFOL (N/A) PERCUTANEOUS ENDOSCOPIC GASTROSTOMY (PEG) REPLACEMENT (N/A)  Patient Location: PACU  Anesthesia Type:General  Level of Consciousness: sedated  Airway & Oxygen Therapy: Patient Spontanous Breathing and Patient connected to face mask oxygen  Post-op Assessment: Report given to RN and Post -op Vital signs reviewed and stable  Post vital signs: Reviewed and stable  Last Vitals:  Filed Vitals:   02/08/16 0507 02/08/16 0800  BP: 89/47   Pulse: 88   Temp: 36.8 C   Resp: 18 12    Complications: No apparent anesthesia complications

## 2016-02-09 ENCOUNTER — Other Ambulatory Visit: Payer: Self-pay | Admitting: Hematology and Oncology

## 2016-02-09 ENCOUNTER — Inpatient Hospital Stay: Payer: 59

## 2016-02-09 DIAGNOSIS — C772 Secondary and unspecified malignant neoplasm of intra-abdominal lymph nodes: Secondary | ICD-10-CM

## 2016-02-09 MED ORDER — KETOROLAC TROMETHAMINE 30 MG/ML IJ SOLN
30.0000 mg | Freq: Three times a day (TID) | INTRAMUSCULAR | Status: DC
  Administered 2016-02-09 – 2016-02-11 (×5): 30 mg via INTRAVENOUS
  Filled 2016-02-09 (×5): qty 1

## 2016-02-09 MED ORDER — JEVITY 1.5 CAL/FIBER PO LIQD
237.0000 mL | Freq: Three times a day (TID) | ORAL | Status: DC
  Administered 2016-02-09 – 2016-02-11 (×2): 237 mL
  Administered 2016-02-12: 16:00:00 80 mL
  Administered 2016-02-12: 70 mL
  Administered 2016-02-13: 80 mL
  Administered 2016-02-13: 12:00:00 60 mL
  Administered 2016-02-14: 237 mL

## 2016-02-09 NOTE — Progress Notes (Signed)
Was in the see the patient at 745 and she was sleeping. Spoke with her aunt. Spoke with the nurse about 8:00 with reports of severe pain in the right hip. Called into the room at 8:15 and spoke with the aunt to send the patient was sleeping. When she awoke she reports that the pain was not in the hip, rather in the right back and then moving into the epigastric area. She's had multiple bowel movements since initiation of tube feedings.  Blood pressure is higher than it has been for the last 24 hours. Normal pulse.  We'll add Toradol for 24 hours for pain control as she is maxed out on her narcotics.  We'll recheck labs in the morning.

## 2016-02-09 NOTE — Progress Notes (Addendum)
Afebrile. BP low, patient without symptoms. Mild soreness at GT site, not unexpected with yesterday's manipulations. Lungs: Clear. Cardio: RR. ABD: Soft, non-tender.  Will see if GI study via GT tube can be completed today to give tube feedings via gastrostomy. If not draining, will need to consider TPN, although this would not be ideal in light of her other issues.    SBFT reviewed. No SB obstruction, good emptying of contrast from stomach. Some pooling in fundus (minimal). Will start po and GT tube feedings.

## 2016-02-09 NOTE — Progress Notes (Signed)
Patient with new onset severe pain in right hip, PCA max limit reached not in pain control, low blood pressure , on PCA. Spoke with Dr Bary Castilla asked about possible hip xray since this is new onset with cancer history. He states since patient has been up walking there is no need for xray. Blood pressure is better than it has been parameters states to call if less than 90 , MD states he will change paramaters for blood pressure and order an non narcotic pain medication.

## 2016-02-09 NOTE — Progress Notes (Signed)
Brief Nutrition Follow-up Note:  Spoke with pt and mother at bedside briefly this pm. Pt tolerated some soup and soda today.   RN, Shirlean Mylar in room getting ready to give first bolus of Jevity 1.5 Reports +liquid BM today prior to starting tube feeding.   Will follow-up tomorrow with pt tube feeding tolerance and further recommendations.   Misty Mack B. Zenia Resides, [place], [place] (pager) Weekend/On-Call pager [phone])

## 2016-02-09 NOTE — Progress Notes (Signed)
Nutrition Follow-up  DOCUMENTATION CODES:   Severe malnutrition in context of chronic illness  INTERVENTION:   -Spoke with Dr. Bary Castilla regarding tube feeding regimen.  Will start with 3 cans bolus of jevity 1.5 (1 full can) between meal trays and monitor pt tolerance.  Will provide 1066 kcals, 45 g of protein, 576ml free water.    If able to tolerate bolus feeding and po intake remains poor recommend increasing to 5 cans of jevity 1.5 to better meet nutritional needs.  Will provide 1777 kcals, 75 g of protein and 986ml free water.   Recommend standard free water flush of 65ml before and after feeding at this time with current IV fluids and starting po intake.    NUTRITION DIAGNOSIS:   Malnutrition related to chronic illness, cancer and cancer related treatments as evidenced by energy intake < or equal to 75% for > or equal to 1 month, percent weight loss, mild depletion of muscle mass.    GOAL:   Patient will meet greater than or equal to 90% of their needs    MONITOR:   Diet advancement, Labs, Weight trends, I & O's  REASON FOR ASSESSMENT:   Malnutrition Screening Tool    ASSESSMENT:   Pt admitted with weakness. Pt with h/o colon cancer with mets to liver, lymph nodes with pancreatic lass. Pt s/p gastrojejunostomy and placement of a gastrostomy tube set to suction currently. Pt scheduled for radiation therapy.  Gastrostomy tube replaced yesterday.  Medications reviewed: protonix, NS with KCL at 35ml/hr Labs reviewed  Diet Order:  Diet full liquid Room service appropriate?: Yes; Fluid consistency:: Thin  Skin:  Reviewed, no issues  Last BM:  02/01/2016  Height:   Ht Readings from Last 1 Encounters:  02/02/16 5\' 5"  (1.651 m)    Weight:   Wt Readings from Last 1 Encounters:  02/08/16 143 lb 6.4 oz (65.046 kg)    Ideal Body Weight:     BMI:  Body mass index is 23.86 kg/(m^2).  Estimated Nutritional Needs:   Kcal:  BEE: 1256kcals, TEE: (IF 1.2-1.4)(AF  1.2) 1822-2126kcals  Protein:  68-80g protein (1.1-1.3g/kg)  Fluid:  1.8-2.1L fluid  EDUCATION NEEDS:   No education needs identified at this time  Misty Mack B. Zenia Resides, [place], [place] (pager) Weekend/On-Call pager [phone])

## 2016-02-09 NOTE — Progress Notes (Signed)
[facility] Hematology/Oncology Progress Note  Date of admission: 02/02/2016  Hospital day:  02/09/2016  Chief Complaint: Misty Mack is a 45 y.o. female with metastatic colon cancer who was admitted with severe back pain.  History of Present Illness:  The patient has metastatic colon cancer. She presented in 01/2014 with biopsy-proven liver metastasis. Pathologic stage was T3 N2 M1.  She had a R1 resection with positive radial margin. Tumor was K-ras mutated. MSI-stable.  She was treated with FOLFOX and Avastin begiining 03/30/2014.  She developed an oxaliplatin reaction on 06/15/2014.  She was treated with FOLFIRI plus Avastin beginning 07/13/2014.  She completed 12 cycles on 09/07/2014.  She received maintenance chemotherapy (5FU/LV + Avastin) beginning 09/28/2014.  With progression (PET scan and markers) she was treated with FOLFOX on a desensitizing protocol.  With progression, she was treated with TAS-102 (trifluridine-tipiracil) from 07/2015 - 09/2015.  She developed severe back pain due to her disease.  Imaging studies on 10/19/2015 revealed progressive enlargement of the retroperitoneal masses.  Mass was centered below the mid pancreatic body and extended to abut or involve the posterior gastric wall anteriorly, the celiac axis posteriorly, pancreas superiorly, and the distal duodenum inferiorly, with encasement and occlusion of the splenic vein. The margins were somewhat indistinct (at least 5.3 x 2.8 cm; previously 3.8 x 2.2).     She began treatment with continuous 5-fluorouracil and radiation on 12/03/2015. Per patient, she received 2 weeks of radiation.  Abdominal/pelvic CT scan on 01/02/2016 revealed minimal decrease in the ill defined mass involving the pancreatic body and posterior wall of the gastric body.  There was new mild left paraaortic retroperitoneal lymphadenopathy, consistent with progression of metastatic disease.  She underwent upper endoscopy, lysis  of adhesions, gastro-jejunostomy, gastrostomy, and biopsy of gastric mass on 01/08/2016 by Dr. Hervey Ard.  Pathology revealed metastatic adenocarcinoma consistent with metastatic adenocarcinoma consistent with colon origin.  She states that her radiation was interrupted recently secondary to severe back pain.  She underwent upper endoscopy on 02/08/2016 by Dr. Bary Castilla.  The esophagus was normal.  He was unable to cannulate the efferent limb of the gastrojejunostomy.  The gastrostomy tube replaced.  Small bowel study on 02/09/2016 revealed a patent gastro jejunostomy and successful distal progression of contrast material with only mild gastric retention. There was no evidence of a distal obstruction.  Her diet has been advanced to full liquids with plan for bolus feeds.   Subjective:  Notes poor appetite, but no nausea or vomiting.  Pain leveled off to "4 out of 10".  Presses pain buttom "as much as I can".  Social History: The patient is accompanied by her mother today.  Allergies:  Allergies  Allergen Reactions  . Fentanyl Swelling    Swelling of lips  . Oxaliplatin Itching  . Hydrocodone-Acetaminophen Hives  . Vancomycin Hives    Scheduled Medications: . feeding supplement (JEVITY 1.5 CAL/FIBER)  237 mL Per Tube TID  . HYDROmorphone   Intravenous 6 times per day  . pantoprazole (PROTONIX) IV  40 mg Intravenous QHS  . piperacillin-tazobactam (ZOSYN)  IV  3.375 g Intravenous 3 times per day    Review of Systems: GENERAL:  Fatigue.  No fevers or sweats.  Weight loss. PERFORMANCE STATUS (ECOG):  2-3 HEENT:  No visual changes, runny nose, sore throat, mouth sores or tenderness. Lungs: No shortness of breath or cough.  No hemoptysis. Cardiac:  No chest pain, palpitations, orthopnea, or PND. GI:  Appetite poor.  Constipation.  No nausea, vomiting, diarrhea, melena or hematochezia. GU:  No urgency, frequency, dysuria, or hematuria. Musculoskeletal:  Back pain.  No joint pain.  No  muscle tenderness. Extremities:  No pain or swelling. Skin:  No rashes or skin changes. Neuro:  General weakness.  No headache, numbness or weakness, balance or coordination issues. Endocrine:  No diabetes, thyroid issues, hot flashes or night sweats. Psych:  No mood changes, depression or anxiety. Pain: Pain on Dilaudid PCA. Review of systems:  All other systems reviewed and found to be negative.  Physical Exam: Blood pressure 99/49, pulse 96, temperature 97.9 F (36.6 C), temperature source Oral, resp. rate 13, height '5\' 5"'$  (1.651 m), weight 143 lb 6.4 oz (65.046 kg), last menstrual period 01/02/2016, SpO2 100 %.  GENERAL:  Thin woman sitting comfortably on the medical unit in no acute distress. MENTAL STATUS:  Alert and oriented to person, place and time. HEAD:  Styled brown hair.  Face thin.  Normocephalic, atraumatic, face symmetric, no Cushingoid features. EYES:  Glasses.  Brown eyes.  Pupils equal round and reactive to light and accomodation.  No conjunctivitis or scleral icterus. ENT:  [place] in place.  Oropharynx clear without lesion.  Tongue normal. Mucous membranes moist.  RESPIRATORY:  Clear to auscultation without rales, wheezes or rhonchi. CARDIOVASCULAR:  Regular rate and rhythm without murmur, rub or gallop. ABDOMEN:  J tube in place.  Dressing C/D/I. Soft, non-tender, with active bowel sounds, and no hepatosplenomegaly.  No masses. SKIN:  No rashes, ulcers or lesions. EXTREMITIES: No edema, no skin discoloration or tenderness.  No palpable cords. LYMPH NODES: No palpable cervical, supraclavicular, axillary or inguinal adenopathy  NEUROLOGICAL: Unremarkable. PSYCH:  Appropriate.  Results for orders placed or performed during the hospital encounter of 02/02/16 (from the past 48 hour(s))  Hemoglobin and hematocrit, blood     Status: Abnormal   Collection Time: 02/08/16  5:57 AM  Result Value Ref Range   Hemoglobin 9.2 (L) 12.0 - 16.0 g/dL    Comment: RESULT REPEATED AND  VERIFIED   HCT 27.7 (L) 35.0 - 47.0 %   Dg Small Bowel  02/09/2016  CLINICAL DATA:  45 year old female with a history of pancreatic mass and gastric outlet obstruction. She has a patent gastro jejunostomy and a percutaneous gastrostomy tube. Evaluate for gastric emptying through the gastro jejunostomy prior to initiating tube feeds. EXAM: SMALL BOWEL SERIES COMPARISON:  Prior CT abdomen/ pelvis 02/02/2016 TECHNIQUE: Following ingestion of thin barium, serial small bowel images were obtained including spot views of the terminal ileum. FLUOROSCOPY TIME:  Radiation Exposure Index (as provided by the fluoroscopic device): -- If the device does not provide the exposure index: Fluoroscopy Time (in minutes and seconds):  0 Number of Acquired Images:  5 FINDINGS: Initial KUB image demonstrates a nonobstructed bowel gas pattern. Surgical clips number upper quadrant suggest prior cholecystectomy. A gastrostomy tube projects over the expected location. Surgical clips in left upper quadrant are consistent with the reported gastrojejunostomy. Small amount of retained barium contrast present within the cecum. Subsequently, 100 mL water-soluble contrast was injected through the gastrostomy tube and serial imaging was performed. The stomach appears normal. The gastro jejunostomy is widely patent. Contrast material immediately flows into the jejunum. Serial imaging was then performed demonstrating continued distal progression of the contrast material as well as emptying of the stomach. There is a small amounts of contrast material which is located within the duodenum. IMPRESSION: 1. Patent gastro jejunostomy. 2. Successful distal progression of contrast material with only mild  gastric retention. No evidence of a distal obstruction. Electronically Signed   By: Jacqulynn Cadet M.D.   On: 02/09/2016 10:25    Assessment:  Misty Mack is a 45 y.o. female with metastatic colon cancer with progressive disease s/p FOLFOX +  Avastin, FOLFIRI + Avastin, and TAS-102 (trifluridine-tipiracil).  She has been receiving palliative radiation and continuous infusion 5-FU to the retroperitoneal mass.  Radiation was recently on hold, but restarted (2 fractions 02/06/2016 and 02/07/2016).  Recent small bowel study reveals no small bowel obstruction with plan to initiate tube feeds through the gastrojejunostomy.  Pain is fairly well controlled with her Dilaudid PCA.  She is using bolus doses regularly.  Plan: 1.  Oncology:  Metastatic colon cancer.  Anticipate restarting radiation on 02/11/2016.  As pain controlled, should be able to proceed without difficulty (previously could not lay on table secondary to pain).  Will discuss with Dr. Baruch Gouty number of radiation fractions planned.  Regarding additional treatment options, Foundation One testing to look for driver mutations is being considered as well as assessing for candidacy for MEK1 inhibitor therapy.  2.  Hematology:  CBC on 04/13 revealed a hematocrit of 19.2 and hemoglobin 6.3.  She received 2 units of PRBCs.  Hematocrit was 27.7 with a hemoglobin of 9.2 on 02/08/2016.  Follow-up CBC next week.  3.  Fluids/electrolytes/nutrition:  Patient to start G tube feeds today.  4.  Pain/Toxicology:  Pain fairly well controlled on Dilaudid PCA.  No adjustments made at this time as patient uses prn doses regularly.  5.  Code status full.   Lequita Asal, MD  02/09/2016, 2:55 PM

## 2016-02-10 ENCOUNTER — Inpatient Hospital Stay: Payer: 59

## 2016-02-10 LAB — COMPREHENSIVE METABOLIC PANEL
ALBUMIN: 1.6 g/dL — AB (ref 3.5–5.0)
ALT: 8 U/L — ABNORMAL LOW (ref 14–54)
ANION GAP: 2 — AB (ref 5–15)
AST: 15 U/L (ref 15–41)
Alkaline Phosphatase: 50 U/L (ref 38–126)
BUN: <5 mg/dL — ABNORMAL LOW (ref 6–20)
CHLORIDE: 116 mmol/L — AB (ref 101–111)
CO2: 25 mmol/L (ref 22–32)
Calcium: 7.8 mg/dL — ABNORMAL LOW (ref 8.9–10.3)
Creatinine, Ser: 0.48 mg/dL (ref 0.44–1.00)
GFR calc Af Amer: >60 mL/min (ref >60)
GFR calc non Af Amer: >60 mL/min (ref >60)
GLUCOSE: 86 mg/dL (ref 65–99)
Potassium: 3 mmol/L — ABNORMAL LOW (ref 3.5–5.1)
SODIUM: 143 mmol/L (ref 135–145)
Total Bilirubin: 0.4 mg/dL (ref 0.3–1.2)
Total Protein: 5.1 g/dL — ABNORMAL LOW (ref 6.5–8.1)

## 2016-02-10 LAB — CBC WITH DIFFERENTIAL/PLATELET
BASOS PCT: 1 %
Basophils Absolute: 0 10*3/uL (ref 0–0.1)
EOS ABS: 0.1 10*3/uL (ref 0–0.7)
EOS PCT: 3 %
HCT: 27.3 % — ABNORMAL LOW (ref 35.0–47.0)
Hemoglobin: 8.9 g/dL — ABNORMAL LOW (ref 12.0–16.0)
Lymphocytes Relative: 20 %
Lymphs Abs: 0.8 10*3/uL — ABNORMAL LOW (ref 1.0–3.6)
MCH: 27.8 pg (ref 26.0–34.0)
MCHC: 32.7 g/dL (ref 32.0–36.0)
MCV: 85.2 fL (ref 80.0–100.0)
MONO ABS: 0.6 10*3/uL (ref 0.2–0.9)
MONOS PCT: 14 %
NEUTROS PCT: 62 %
Neutro Abs: 2.7 10*3/uL (ref 1.4–6.5)
PLATELETS: 239 10*3/uL (ref 150–440)
RBC: 3.21 MIL/uL — ABNORMAL LOW (ref 3.80–5.20)
RDW: 22 % — AB (ref 11.5–14.5)
WBC: 4.2 10*3/uL (ref 3.6–11.0)

## 2016-02-10 MED ORDER — POTASSIUM CHLORIDE 20 MEQ/15ML (10%) PO SOLN
20.0000 meq | Freq: Two times a day (BID) | ORAL | Status: DC
  Administered 2016-02-10 – 2016-02-14 (×8): 20 meq
  Filled 2016-02-10 (×10): qty 15

## 2016-02-10 MED ORDER — ENOXAPARIN SODIUM 30 MG/0.3ML ~~LOC~~ SOLN
30.0000 mg | SUBCUTANEOUS | Status: DC
  Administered 2016-02-10 – 2016-02-14 (×5): 30 mg via SUBCUTANEOUS
  Filled 2016-02-10 (×5): qty 0.3

## 2016-02-10 MED ORDER — ACETAMINOPHEN 325 MG PO TABS: 650.0000 mg | ORAL_TABLET | ORAL | Status: DC | PRN

## 2016-02-10 NOTE — Progress Notes (Addendum)
Nutrition Follow-up  DOCUMENTATION CODES:   Severe malnutrition in context of chronic illness  INTERVENTION:  -Monitor po intake and diet progression -Continue current tube feeding regimen at this time -Continue carnation instant breakfast for additional kcals and protein.   NUTRITION DIAGNOSIS:   Malnutrition related to chronic illness, cancer and cancer related treatments as evidenced by energy intake < or equal to 75% for > or equal to 1 month, percent weight loss, mild depletion of muscle mass.    GOAL:   Patient will meet greater than or equal to 90% of their needs    MONITOR:   Diet advancement, Labs, Weight trends, I & O's  REASON FOR ASSESSMENT:   Consult Enteral/tube feeding initiation and management  ASSESSMENT:   Pt admitted with weakness. Pt with h/o colon cancer with mets to liver, lymph nodes. Pt s/p replacement of gastrostomy tube.    Spoke with RN, Shirlean Mylar this am and pt tolerated most of tomato soup this am, 100% coke and juice off breakfast tray.  Carnation instant Breakfast being sent as well.    RN reports yesterday pm gave pt 60ml of jevity 1.5 mixed with some water and pt reported feeling full, refused evening feeding of jevity 1.5.  RN reports had 3 loose BMs yesterday some were prior to started tube feeding. Noted loose BM this am. RN did report pt tolerated 236ml water and medication via tube this am.  Noted right hip pain, back pain, epigastric pain last shift.  Medications reviewed: colace stopped Labs reviewed  Diet Order:  Diet full liquid Room service appropriate?: Yes; Fluid consistency:: Thin  Skin:  Reviewed, no issues  Last BM:  02/01/2016  Height:   Ht Readings from Last 1 Encounters:  02/02/16 5\' 5"  (1.651 m)    Weight:   Wt Readings from Last 1 Encounters:  02/08/16 143 lb 6.4 oz (65.046 kg)    Ideal Body Weight:     BMI:  Body mass index is 23.86 kg/(m^2).  Estimated Nutritional Needs:   Kcal:  BEE: 1256kcals,  TEE: (IF 1.2-1.4)(AF 1.2) 1822-2126kcals  Protein:  68-80g protein (1.1-1.3g/kg)  Fluid:  1.8-2.1L fluid  EDUCATION NEEDS:   No education needs identified at this time  Aziah Brostrom B. Zenia Resides, [place], [place] (pager) Weekend/On-Call pager [phone])

## 2016-02-10 NOTE — Progress Notes (Signed)
[facility] Hematology/Oncology Progress Note  Date of admission: 02/02/2016  Hospital day:  02/10/2016  Chief Complaint: Misty Mack is a 45 y.o. female with metastatic colon cancer who was admitted with severe back pain.  Subjective:   Tolerating full liquid diet.  Tube feeds started.  Appetite has improved.  No nausea.  Increase in stools.  Transient right sided back pain and epigastric pain last night.  Relief with Toradol.  Pain has improved to a level "2-3 out of 10".    Social History: The patient is accompanied by her mother today.  Allergies:  Allergies  Allergen Reactions  . Fentanyl Swelling    Swelling of lips  . Oxaliplatin Itching  . Hydrocodone-Acetaminophen Hives  . Vancomycin Hives    Scheduled Medications: . enoxaparin (LOVENOX) injection  30 mg Subcutaneous Q24H  . feeding supplement (JEVITY 1.5 CAL/FIBER)  237 mL Per Tube TID  . HYDROmorphone   Intravenous 6 times per day  . ketorolac  30 mg Intravenous 3 times per day  . pantoprazole (PROTONIX) IV  40 mg Intravenous QHS  . potassium chloride  20 mEq Per Tube BID    Review of Systems: GENERAL:  Fatigue.  No fevers or sweats.  Weight loss. PERFORMANCE STATUS (ECOG):  2-3 HEENT:  No visual changes, runny nose, sore throat, mouth sores or tenderness. Lungs: No shortness of breath or cough.  No hemoptysis. Cardiac:  No chest pain, palpitations, orthopnea, or PND. GI:  Appetite improving.  Notes initial constipation, then increased stools (preceded GT feeds).  No nausea, vomiting, melena or hematochezia. GU:  No urgency, frequency, dysuria, or hematuria. Musculoskeletal:  Back pain with radiation to epigastric area last night.  No joint pain.  No muscle tenderness. Extremities:  No pain or swelling. Skin:  No rashes or skin changes. Neuro:  General weakness, but feels like walking today.  No headache, numbness or weakness, balance or coordination issues. Endocrine:  No diabetes,  thyroid issues, hot flashes or night sweats. Psych:  No mood changes, depression or anxiety. Pain: Pain improved on Dilaudid PCA and Toradol prn. Review of systems:  All other systems reviewed and found to be negative.  Physical Exam: Blood pressure 87/51, pulse 80, temperature 98.5 F (36.9 C), temperature source Oral, resp. rate 18, height _0  (1.651 m), weight 143 lb 6.4 oz (65.046 kg), last menstrual period 01/02/2016, SpO2 100 %.  GENERAL:  Thin woman sitting comfortably on the medical unit in no acute distress. MENTAL STATUS:  Alert and oriented to person, place and time. HEAD:  Styled brown hair.  Face thin.  Normocephalic, atraumatic, face symmetric, no Cushingoid features. EYES:  Glasses.  Brown eyes.  Pupils equal round and reactive to light and accomodation.  No conjunctivitis or scleral icterus. ENT:  [place] in place.  Oropharynx clear without lesion.  Tongue normal. Mucous membranes moist.  RESPIRATORY:  Clear to auscultation without rales, wheezes or rhonchi. CARDIOVASCULAR:  Regular rate and rhythm without murmur, rub or gallop. ABDOMEN:  G tube in place.  Dressing C/D/I. Soft, non-tender, with active bowel sounds, and no hepatosplenomegaly.  No masses. SKIN:  No rashes, ulcers or lesions. EXTREMITIES: No edema, no skin discoloration or tenderness.  No palpable cords. LYMPH NODES: No palpable cervical, supraclavicular, axillary or inguinal adenopathy  NEUROLOGICAL: Unremarkable. PSYCH:  Appropriate.  Results for orders placed or performed during the hospital encounter of 02/02/16 (from the past 48 hour(s))  CBC with Differential/Platelet     Status: Abnormal  Collection Time: 02/10/16  4:10 AM  Result Value Ref Range   WBC 4.2 3.6 - 11.0 K/uL   RBC 3.21 (L) 3.80 - 5.20 MIL/uL   Hemoglobin 8.9 (L) 12.0 - 16.0 g/dL   HCT 27.3 (L) 35.0 - 47.0 %   MCV 85.2 80.0 - 100.0 fL   MCH 27.8 26.0 - 34.0 pg   MCHC 32.7 32.0 - 36.0 g/dL   RDW 22.0 (H) 11.5 - 14.5 %   Platelets 239  150 - 440 K/uL   Neutrophils Relative % 62 %   Neutro Abs 2.7 1.4 - 6.5 K/uL   Lymphocytes Relative 20 %   Lymphs Abs 0.8 (L) 1.0 - 3.6 K/uL   Monocytes Relative 14 %   Monocytes Absolute 0.6 0.2 - 0.9 K/uL   Eosinophils Relative 3 %   Eosinophils Absolute 0.1 0 - 0.7 K/uL   Basophils Relative 1 %   Basophils Absolute 0.0 0 - 0.1 K/uL  Comprehensive metabolic panel     Status: Abnormal   Collection Time: 02/10/16  4:10 AM  Result Value Ref Range   Sodium 143 135 - 145 mmol/L   Potassium 3.0 (L) 3.5 - 5.1 mmol/L   Chloride 116 (H) 101 - 111 mmol/L   CO2 25 22 - 32 mmol/L   Glucose, Bld 86 65 - 99 mg/dL   BUN <5 (L) 6 - 20 mg/dL   Creatinine, Ser 0.48 0.44 - 1.00 mg/dL   Calcium 7.8 (L) 8.9 - 10.3 mg/dL   Total Protein 5.1 (L) 6.5 - 8.1 g/dL   Albumin 1.6 (L) 3.5 - 5.0 g/dL   AST 15 15 - 41 U/L   ALT 8 (L) 14 - 54 U/L   Alkaline Phosphatase 50 38 - 126 U/L   Total Bilirubin 0.4 0.3 - 1.2 mg/dL   GFR calc non Af Amer >60 >60 mL/min   GFR calc Af Amer >60 >60 mL/min    Comment: (NOTE) The eGFR has been calculated using the CKD EPI equation. This calculation has not been validated in all clinical situations. eGFR's persistently <60 mL/min signify possible Chronic Kidney Disease.    Anion gap 2 (L) 5 - 15   Dg Small Bowel  02/09/2016  CLINICAL DATA:  45 year old female with a history of pancreatic mass and gastric outlet obstruction. She has a patent gastro jejunostomy and a percutaneous gastrostomy tube. Evaluate for gastric emptying through the gastro jejunostomy prior to initiating tube feeds. EXAM: SMALL BOWEL SERIES COMPARISON:  Prior CT abdomen/ pelvis 02/02/2016 TECHNIQUE: Following ingestion of thin barium, serial small bowel images were obtained including spot views of the terminal ileum. FLUOROSCOPY TIME:  Radiation Exposure Index (as provided by the fluoroscopic device): -- If the device does not provide the exposure index: Fluoroscopy Time (in minutes and seconds):  0  Number of Acquired Images:  5 FINDINGS: Initial KUB image demonstrates a nonobstructed bowel gas pattern. Surgical clips number upper quadrant suggest prior cholecystectomy. A gastrostomy tube projects over the expected location. Surgical clips in left upper quadrant are consistent with the reported gastrojejunostomy. Small amount of retained barium contrast present within the cecum. Subsequently, 100 mL water-soluble contrast was injected through the gastrostomy tube and serial imaging was performed. The stomach appears normal. The gastro jejunostomy is widely patent. Contrast material immediately flows into the jejunum. Serial imaging was then performed demonstrating continued distal progression of the contrast material as well as emptying of the stomach. There is a small amounts of contrast material which is  located within the duodenum. IMPRESSION: 1. Patent gastro jejunostomy. 2. Successful distal progression of contrast material with only mild gastric retention. No evidence of a distal obstruction. Electronically Signed   By: Misty Mack M.D.   On: 02/09/2016 10:25   Dg Abd Portable 1v  02/10/2016  CLINICAL DATA:  45 year old female with a history of small bowel obstruction. EXAM: PORTABLE ABDOMEN - 1 VIEW COMPARISON:  Plain film 02/09/2016, 02/06/2016, CT 02/02/2016 FINDINGS: Compared to the prior small bowel follow-through, there has been evacuation of enteric contrast from the gut. Gas within stomach, small bowel, and colon. Small amount of gas within the rectum. No abnormally distended small bowel or colon. Small foci of enteric contrast, likely within diverticula. Gastric tube projects over the upper abdomen. IMPRESSION: Nonobstructive bowel gas pattern, with interval evacuation of enteric contrast. Gastric tube unchanged. Signed, Dulcy Fanny. Earleen Newport, DO [facility] Radiology Electronically Signed   By: Corrie Mckusick D.O.   On: 02/10/2016 08:26     Assessment:  AUBRIELLE STROUD is a 45 y.o. female with metastatic colon cancer with progressive disease s/p FOLFOX + Avastin, FOLFIRI + Avastin, and TAS-102 (trifluridine-tipiracil).  She has been receiving palliative radiation and continuous infusion 5-FU to the retroperitoneal mass.  Radiation was recently on hold, but restarted (2 fractions 02/06/2016 and 02/07/2016).  Abdomen and pelvic CT scan on 02/02/2016 revealed persistent pancreatic mass with involvement of the adjacent stomach and fourth portion of the duodenum. There was foci of air within the mass suggesting a degree of communication with a at the stomach with the duodenum.  There was slight increase in size of retroperitoneal lymphadenopathy (19 mm versus 14 mm).   Small bowel study on 02/09/2016 revealed no small bowel obstruction.  She is tolerating full liquids.  She feels full with G tube feeds.  She has had increased stools.  Pain is well controlled on a Dilaudid PCA and Toradol prn.  She feels like she can get out of bed today and walk.  Plan: 1.  Oncology:  Metastatic colon cancer.  Anticipate restarting radiation on 02/11/2016.  As pain controlled, should be able to proceed without difficulty (previously could not lay on table secondary to pain).  Will discuss with Dr. Baruch Gouty tomorrow the number of radiation fractions planned.  Regarding additional treatment options, Foundation One testing to look for driver mutations is being considered as well as assessing for candidacy for MEK1 inhibitor therapy.  2.  Hematology:  CBC on 04/13 revealed a hematocrit of 19.2 and hemoglobin 6.3.  She received 2 units of PRBCs on 02/07/2016.  Hematocrit is 27.3 with a hemoglobin of 8.9 today.  3.  Fluids/electrolytes/nutrition:  Tolerating full liquids and G tube feeds without increased pain, nausea or vomiting.  Appreciate Nutrition consult.  Some increased stool output possibly due to concentration/volume of feeds or some element of  pancreatic insufficiency.  Hypokalemia (3.0) supplimented today and likely secondary to diarrhea.  4.  Pain/Toxicology:  Pain well controlled on Dilaudid PCA and Toradol.  No adjustments made at this time secondary to planned radiation in AM.    5.  Code status full.   Lequita Asal, MD  02/10/2016, 2:09 PM

## 2016-02-10 NOTE — Progress Notes (Signed)
Afebrile.BP at baseline.  Pain from last PM resolved. Reports it was in epigastrium, with a posterior component in right flank. Slept well. Reports tolerating full liquids better than in past yesterday. Interested in diet advance. Lungs: Clear. ABD: Soft. Good BS. Extrem: Soft. Labs: K+ 3.0. Will replenish. CBC: Stable HGB at 8.9, normal WBC. Plain film this AM: Normal gas pattern.  Plan: D/C IV ATB. Ambulate. Resume lovenox.

## 2016-02-11 ENCOUNTER — Encounter: Payer: Self-pay | Admitting: General Surgery

## 2016-02-11 ENCOUNTER — Ambulatory Visit
Admission: RE | Admit: 2016-02-11 | Discharge: 2016-02-11 | Disposition: A | Discharge status: Home / Self Care | Payer: 59 | Source: Ambulatory Visit | Attending: Radiation Oncology | Admitting: Radiation Oncology

## 2016-02-11 DIAGNOSIS — C185 Malignant neoplasm of splenic flexure: Secondary | ICD-10-CM | POA: Diagnosis not present

## 2016-02-11 MED ORDER — HYDROMORPHONE 1 MG/ML IV SOLN
INTRAVENOUS | Status: DC
  Administered 2016-02-11: 11:00:00 via INTRAVENOUS
  Filled 2016-02-11 (×2): qty 25

## 2016-02-11 MED ORDER — KETOROLAC TROMETHAMINE 10 MG PO TABS
10.0000 mg | ORAL_TABLET | Freq: Four times a day (QID) | ORAL | Status: DC
  Administered 2016-02-11 – 2016-02-14 (×11): 10 mg via ORAL
  Filled 2016-02-11 (×13): qty 1

## 2016-02-11 MED ORDER — PANTOPRAZOLE SODIUM 40 MG PO TBEC
40.0000 mg | DELAYED_RELEASE_TABLET | Freq: Every day | ORAL | Status: DC
  Administered 2016-02-11 – 2016-02-13 (×3): 40 mg via ORAL
  Filled 2016-02-11 (×3): qty 1

## 2016-02-11 NOTE — Progress Notes (Signed)
Patient continues to have nausea since having Jevity this afternoon. Pt refuses 8pm Jevity supplement. Education provided but pt unable to tolerate at this time.

## 2016-02-11 NOTE — Progress Notes (Signed)
Misty Mack   DOB:Mar 20, 1971   RW#:[id number]    Subjective: Significant improvement in the pain noted on continuous Dilaudid PCA.  Had episode of pain 2 nights ago when she needed Toradol. She started radiation yesterday. No blood in stools.  She tolerated full liquid diet. Plan advancing as per surgery.  ROS: Poor appetite. No nausea no vomiting.  Objective:  Filed Vitals:   02/11/16 0800 02/11/16 1200  BP:    Pulse:    Temp:    Resp: 28 26     Intake/Output Summary (Last 24 hours) at 02/11/16 1331 Last data filed at 02/10/16 1945  Gross per 24 hour  Intake   1882 ml  Output      0 ml  Net   1882 ml    GENERAL:alert, no distress; Moderately built moderately nourished. She is accompanied by her aunt/mother.  She is sitting with edge of the bed/having breakfast. EYES: Positive for pallor; no icterus. OROPHARYNX: No thrush ulceration. NECK: supple, no lymphadenopathy. LYMPH:  no palpable lymphadenopathy in the cervical, axillary or inguinal LUNGS: Decreased breath sounds bilaterally to bases. No visible crackles HEART: regular rate & rhythm and no murmurs and no lower extremity edema ABDOMEN:abdomen soft, non-tender and normal bowel sounds; positive for PEG-tube. . Musculoskeletal:no cyanosis of digits and no clubbing  NEURO: alert & oriented x 3 with fluent speech, no focal motor/sensory deficits   Labs:  Lab Results  Component Value Date   WBC 4.2 02/10/2016   HGB 8.9* 02/10/2016   HCT 27.3* 02/10/2016   MCV 85.2 02/10/2016   PLT 239 02/10/2016   NEUTROABS 2.7 02/10/2016    Lab Results  Component Value Date   NA 143 02/10/2016   K 3.0* 02/10/2016   CL 116* 02/10/2016   CO2 25 02/10/2016    Studies:  Dg Abd Portable 1v  02/10/2016  CLINICAL DATA:  45 year old female with a history of small bowel obstruction. EXAM: PORTABLE ABDOMEN - 1 VIEW COMPARISON:  Plain film 02/09/2016, 02/06/2016, CT 02/02/2016 FINDINGS: Compared to the prior small bowel follow-through,  there has been evacuation of enteric contrast from the gut. Gas within stomach, small bowel, and colon. Small amount of gas within the rectum. No abnormally distended small bowel or colon. Small foci of enteric contrast, likely within diverticula. Gastric tube projects over the upper abdomen. IMPRESSION: Nonobstructive bowel gas pattern, with interval evacuation of enteric contrast. Gastric tube unchanged. Signed, Dulcy Fanny. Earleen Newport, DO [facility] Electronically Signed   By: Corrie Mckusick D.O.   On: 02/10/2016 08:26    Assessment & Plan:   # Intractable pain/Mid back- question involvement of the celiac plexus with the retroperitoneal mass/metastatic disease. Improvement noted on Dilaudid PCA- currently on 0.5 mg continuous infusion/0.25 on demand; continuous changed to  0.25.   Patient did not go for radiation on the 14th/ Friday;  Plan start radiation today.  I spoke to Dr. Donella Stade. I will try to go down on the continuous infusion and adjust her pain medication accordingly.  # Metastatic colon cancer- K-ras mutated/ status post progression on FOLFOX with Avastin; and also FOLFIRI; s/p progression on TAS-102; MSI-STABLE. Check  Foundation 1 testing.  # Hemoglobin  8.9 today. multifactorial; I do not suspect any active bleeding.  Status post 2 units of PRBC.  # Malnutrition/poor by mouth intake. Overall prognosis is poor.   Cammie Sickle, MD 02/11/2016  1:31 PM

## 2016-02-11 NOTE — Progress Notes (Signed)
Nutrition Follow-up  DOCUMENTATION CODES:   Severe malnutrition in context of chronic illness  INTERVENTION:   -EN: Per MD Byrnett this morning, plan to continue 3 cans of Jevity 1.5 bolus between meals. Recommended to pt to sit up during and for at least one hour after each bolus. Recommend not pushing TF, administer via gravity.  Discussed with pt and mother importance of nutritional poc and encouraged a can of TF between meals as able, similar to having a shake between meals to boost nutritional status. Will continue to follow, monitor GI tolerance.  -Will discontinue Carnation Instant Breakfast per pt request -RD notes diet order advancement with no fresh fruits or vegetables   NUTRITION DIAGNOSIS:   Malnutrition related to chronic illness, cancer and cancer related treatments as evidenced by energy intake < or equal to 75% for > or equal to 1 month, percent weight loss, mild depletion of muscle mass.  GOAL:   Patient will meet greater than or equal to 90% of their needs  MONITOR:   Diet advancement, Labs, Weight trends, I & O's  REASON FOR ASSESSMENT:   Consult Enteral/tube feeding initiation and management  ASSESSMENT:   Pt admitted with weakness. Pt with h/o colon cancer with mets to liver, lymph nodes. Pt s/p replacement of gastrostomy tube.    Pt lying down at bedside on visit c/o nausea. Pt reports walking around unit this afternoon and s/p radiation therapy this am.    Diet Order:  Diet regular Room service appropriate?: Yes; Fluid consistency:: Thin    Current Nutrition: Pt reports eating 2 bites of egg whites and toast this am for breakfast. Pt reports having tomato soup and grilled cheese sandwich at lunch but eating only a small portion.   Pt s/p 221mL of Jevity 1.5 can this afternoon. Pt c/o nausea on visit, no emesis. Pt had only had one bolus on visit today as she was in radiation therapy this am. Pt hopeful to have some more EN around 4pm this afternoon.     Gastrointestinal Profile: Last BM: 02/11/2016  Medications: Protonix, KCl, NS with KCl at 12mL/hr Labs: K 3.0 on 4/16, Alb 1.6   Weight Trend since Admission: Filed Weights   02/02/16 1034 02/08/16 0900  Weight: 137 lb (62.143 kg) 143 lb 6.4 oz (65.046 kg)     Skin:  Reviewed, no issues   BMI:  Body mass index is 23.86 kg/(m^2).  Estimated Nutritional Needs:   Kcal:  BEE: 1256kcals, TEE: (IF 1.2-1.4)(AF 1.2) 1822-2126kcals  Protein:  68-80g protein (1.1-1.3g/kg)  Fluid:  1.8-2.1L fluid  EDUCATION NEEDS:   No education needs identified at this time  Dwyane Luo, RD, LDN Pager [phone] Weekend/On-Call Pager [phone]

## 2016-02-11 NOTE — Progress Notes (Signed)
Tmax 99.1, VSS. Ambulated around the RN station x 3 for the first time. Voiding well. One BM yesterday. Tube feedings as patient reported to be eating well and on one occasion felt "full" with initiation.  Discussed with the nurse that the feedings should be given regardless, unless vomiting. Lungs: Clear. Cardio: RR. ABD: Soft, BS+. Extrem: soft. Labs yesterday notable for low K+ (now being supplemented) and an albumin of 1.6. Plan: Advance diet, discussed w/ dietician re: avoiding large quantities of raw fruits/ vegetables.

## 2016-02-12 ENCOUNTER — Ambulatory Visit
Admission: RE | Admit: 2016-02-12 | Discharge: 2016-02-12 | Disposition: A | Discharge status: Home / Self Care | Payer: 59 | Source: Ambulatory Visit | Attending: Radiation Oncology | Admitting: Radiation Oncology

## 2016-02-12 ENCOUNTER — Ambulatory Visit: Payer: 59

## 2016-02-12 DIAGNOSIS — R634 Abnormal weight loss: Secondary | ICD-10-CM

## 2016-02-12 DIAGNOSIS — R64 Cachexia: Secondary | ICD-10-CM

## 2016-02-12 DIAGNOSIS — C185 Malignant neoplasm of splenic flexure: Secondary | ICD-10-CM | POA: Diagnosis not present

## 2016-02-12 DIAGNOSIS — R1013 Epigastric pain: Secondary | ICD-10-CM

## 2016-02-12 LAB — BASIC METABOLIC PANEL
ANION GAP: 7 (ref 5–15)
CALCIUM: 8.1 mg/dL — AB (ref 8.9–10.3)
CO2: 25 mmol/L (ref 22–32)
Chloride: 111 mmol/L (ref 101–111)
Creatinine, Ser: 0.4 mg/dL — ABNORMAL LOW (ref 0.44–1.00)
GFR calc Af Amer: >60 mL/min (ref >60)
GLUCOSE: 81 mg/dL (ref 65–99)
Potassium: 3.4 mmol/L — ABNORMAL LOW (ref 3.5–5.1)
SODIUM: 143 mmol/L (ref 135–145)

## 2016-02-12 LAB — CBC WITH DIFFERENTIAL/PLATELET
BASOS ABS: 0 10*3/uL (ref 0–0.1)
Basophils Relative: 1 %
EOS ABS: 0.1 10*3/uL (ref 0–0.7)
Eosinophils Relative: 2 %
HCT: 27.7 % — ABNORMAL LOW (ref 35.0–47.0)
Hemoglobin: 9 g/dL — ABNORMAL LOW (ref 12.0–16.0)
Lymphocytes Relative: 14 %
Lymphs Abs: 0.9 10*3/uL — ABNORMAL LOW (ref 1.0–3.6)
MCH: 27.2 pg (ref 26.0–34.0)
MCHC: 32.4 g/dL (ref 32.0–36.0)
MCV: 84.1 fL (ref 80.0–100.0)
MONO ABS: 0.7 10*3/uL (ref 0.2–0.9)
Monocytes Relative: 12 %
Neutro Abs: 4.3 10*3/uL (ref 1.4–6.5)
Neutrophils Relative %: 71 %
PLATELETS: 247 10*3/uL (ref 150–440)
RBC: 3.29 MIL/uL — AB (ref 3.80–5.20)
RDW: 22.7 % — AB (ref 11.5–14.5)
WBC: 6.1 10*3/uL (ref 3.6–11.0)

## 2016-02-12 MED ORDER — ONDANSETRON HCL 4 MG/2ML IJ SOLN
4.0000 mg | Freq: Four times a day (QID) | INTRAMUSCULAR | Status: DC | PRN
  Administered 2016-02-12 – 2016-02-13 (×3): 4 mg via INTRAVENOUS
  Filled 2016-02-12 (×3): qty 2

## 2016-02-12 MED ORDER — DIPHENHYDRAMINE HCL 12.5 MG/5ML PO ELIX
12.5000 mg | ORAL_SOLUTION | Freq: Four times a day (QID) | ORAL | Status: DC | PRN
  Filled 2016-02-12: qty 5

## 2016-02-12 MED ORDER — DIPHENHYDRAMINE HCL 50 MG/ML IJ SOLN: 12.5000 mg | Freq: Four times a day (QID) | INTRAMUSCULAR | Status: DC | PRN

## 2016-02-12 MED ORDER — SODIUM CHLORIDE 0.9% FLUSH: 9.0000 mL | INTRAVENOUS | Status: DC | PRN

## 2016-02-12 MED ORDER — HYDROMORPHONE 1 MG/ML IV SOLN
INTRAVENOUS | Status: DC
  Administered 2016-02-12: 1 mg via INTRAVENOUS
  Filled 2016-02-12: qty 25

## 2016-02-12 MED ORDER — NALOXONE HCL 0.4 MG/ML IJ SOLN: 0.4000 mg | INTRAMUSCULAR | Status: DC | PRN

## 2016-02-12 NOTE — Progress Notes (Signed)
New referral for hospice of [place] services at home following discharge. [place] spoke with patient's mother Karena Addison, plan to meet to discuss hospice services tomorrow morning. Thank you. Flo Shanks RN, BSN, [place] and [facility] liaison [phone] c

## 2016-02-12 NOTE — Care Management (Signed)
Spoke with Dr. Jeb Levering. Will need Hospice services in the home. Will need PCA pump in the home. Possible discharge tomorrow. Spoke with Ms. Lijewski and her mother at the bedside. Discussed Hospice agencies. [place]. Flo Shanks RN representative for [employer] updated. Shelbie Ammons RN MSN CCM Care Management [phone]

## 2016-02-12 NOTE — Progress Notes (Signed)
Palliative Care Update  I had talked with pt about goals of care and code status  --but she did not come to any decision about code status.  I have followed her by chart review since my original consult, noting that she was to have no decrease or changes in the Dilaudid PCA  Until after the PEG tube was replaced.  This was done-- but now pt is having trouble tolerating the tube feedings.  Dr. Oliva Bustard came in and talked with pt and family and they are agreeable to Hospice in the Home. Will talk with hospice liaison about home meds and will be available to help make adjustments if such is needed.   Colleen Can, MD

## 2016-02-12 NOTE — Progress Notes (Signed)
AVSS. Felt poorly yesterday PM.  Reports full feeling with minimal volume of TF instillation.  + BM.  Lungs: Clear. ABD: Soft, non-distended, non-tender. Labs: WBC normal 48 after d/c IV antibiotics. K+ imporved at 3.4. Discussed with Dr. Oliva Bustard, to see this afternoon to discuss long term goal. Discussed with mother separately. Poor prognosis, need for IV infusion of dilaudid for pain control.

## 2016-02-12 NOTE — Progress Notes (Signed)
45 year old lady with recurrent and refractory carcinoma of colon failed previous multiple chemotherapy. Increasing pain.  Persistent nausea or vomiting.  Had radiation and chemotherapy with 5-FU however poor tolerance.  Pain is controlled only with Dilaudid intravenous. Losing weight.  Declining performance status. Examination shows healing  cachectic lady./ In mild distress because of persistent pain Abdomen: Tenderness in epigastric area no ascites All other examination has been unchanged. I had a prolonged discussion with patient and then social worker and hospice coordinator Patient is not a candidate for any further chemotherapy or any available protocol because of declining performance status Patient and family is agreeable with hospice care at home Because of previous allergy to fentanyl patch and morphine patient's pain can be better controlled with Dilaudid PCA with 0.2 mg/h continuous infusion and 0.3 mg intermittent patient-controlled dose May try steroid (4 mg daily) and Toradol 10 mg every 8 hourly when necessary (if tolerated) in between to control pain Continuing radiation may be optional I do not believe would add to much in overall outcome or improvement in quality of life.  As per  nurses on the floor patient is not tolerating jevity by feeding tube.  And  can been put on hold    Total duration of visit was 45 minutes.  50% or more time was spent in counseling patient and family regarding prognosis and options of treatment and available resources [place] is agreeable and can be discharged tomorrow if considered appropriate by other services . Discussed  situation with Dr. Bary Castilla

## 2016-02-13 ENCOUNTER — Ambulatory Visit
Admission: RE | Admit: 2016-02-13 | Discharge: 2016-02-13 | Disposition: A | Discharge status: Home / Self Care | Payer: 59 | Source: Ambulatory Visit | Attending: Radiation Oncology | Admitting: Radiation Oncology

## 2016-02-13 ENCOUNTER — Ambulatory Visit: Payer: 59

## 2016-02-13 DIAGNOSIS — C185 Malignant neoplasm of splenic flexure: Secondary | ICD-10-CM | POA: Diagnosis not present

## 2016-02-13 NOTE — Progress Notes (Signed)
Palliative Care Update  Again, I am following pt at a distance in the event that my help is needed.  I have talked with Hospice Liaison about some of the challenges in optimizing comfort care for patient.  I will check back in the morning.  I have occasionally seen and spoken with pts mother in the hallways, but not today.    Dr Oliva Bustard has been most helpful in communicating clearly and kindly to pt and pt's family about the situation.  There are a few things that are yet to be worked out.   Pt remains full code.  Colleen Can, MD Palliative Care

## 2016-02-13 NOTE — Progress Notes (Signed)
Writer spoke with patient's mother in the hallway this morning, answered some questions, still unable to discuss hospice services with patient. Prescription for continuous dilaudid pump received and given to [facility] rep. Plan for delivery of PCA pump and medication to the hospital tomorrow. CMRN Hassan Rowan made aware. Flo Shanks RN, BSN, [place] and [facility] of [place], [facility] [phone] c

## 2016-02-13 NOTE — Care Management (Signed)
Requested Dilaudid pump prescription from Dr. Jeb Levering for home use.  Will give prescription to Floydene Flock, [place] representative.  Shelbie Ammons RN MSN CCM Care Management [phone]

## 2016-02-13 NOTE — Progress Notes (Signed)
Hard copy prescription for Dilaudid pump given to [facility] from [facility].  Clarise Cruz, RN

## 2016-02-13 NOTE — Progress Notes (Signed)
Pt walked 2 laps around nurse's station.

## 2016-02-13 NOTE — Progress Notes (Signed)
Quiet night. Long discussion w/ patient and mother about need to clarify CODE STATUS. She is aware that as it stands she will be resuscitated including cardioversion and intubation.  Abdominal exam remains unremarkable. Reported poor tolerance of gastrostomy feedings, discontinued by Dr. Oliva Bustard.  No new interventions planned. Awaiting clarification of discharge to hospice at home or hospice home.

## 2016-02-14 ENCOUNTER — Ambulatory Visit
Admission: RE | Admit: 2016-02-14 | Discharge: 2016-02-14 | Disposition: A | Discharge status: Home / Self Care | Payer: 59 | Source: Ambulatory Visit | Attending: Radiation Oncology | Admitting: Radiation Oncology

## 2016-02-14 DIAGNOSIS — C185 Malignant neoplasm of splenic flexure: Secondary | ICD-10-CM | POA: Diagnosis not present

## 2016-02-14 MED ORDER — ACETAMINOPHEN 325 MG PO TABS: 650.0000 mg | ORAL_TABLET | ORAL | Status: AC | PRN

## 2016-02-14 NOTE — Progress Notes (Signed)
Discharged via wheelchair by auxiliary staff. Belongings sent with patient and family.

## 2016-02-14 NOTE — Progress Notes (Signed)
New referral for [facility] at home following discharge received from [facility] on 4/18. Writer was able to speak with both [name] and her mother this morning. Education initiated regarding hospice services, philosophy and team approach to care with good understanding voiced. [name] has an 45 year old son, [place] has been requested.  [name] was admitted to [facility] on 4/8 for evaluation and treatment of a gastric outlet obstruction. G-tube had been placed on 3/14. She has had some nausea and vomiting and tube feeding were adjusted. Nausea has improved, now with a small amount of oral intake. No feedings planned at discharge. She has a history of colon cancer with metastatic disease to the liver and pancreas. She is status post chemo and radiation. She has had intractable pain in her mid back region which is now controlled with a Dilaudid PCA , bolus only, as well as radiation. Plan is for patient to discharge home today with a PCA pump, provided by [facility] as patient has [place] [facility] [employer]. Patient information faxed to referral. Hospice  Patient to discharge via [place]. Hospital care team all aware. She at this time remains a Full Code. Thank you for the opportunity to be involved in the care of this patient.  Flo Shanks Rn, BSN, [place] and Palliative Care of [place], [facility] [phone] c

## 2016-02-14 NOTE — Discharge Instructions (Signed)

## 2016-02-14 NOTE — Progress Notes (Signed)
MD making rounds. Discharge orders received. IV discontinued. Prescriptions E-Scribed to pharmacy. Provided with Education Handouts. Discharge paperwork provided, explained, signed and witnessed. No unanswered questions. Awaiting discharge equipment per Hospice RN.

## 2016-02-15 ENCOUNTER — Inpatient Hospital Stay: Payer: 59 | Admitting: Hematology and Oncology

## 2016-02-15 ENCOUNTER — Inpatient Hospital Stay: Payer: 59

## 2016-02-15 ENCOUNTER — Ambulatory Visit: Payer: 59

## 2016-02-15 ENCOUNTER — Ambulatory Visit
Admission: RE | Admit: 2016-02-15 | Discharge: 2016-02-15 | Disposition: A | Discharge status: Home / Self Care | Payer: 59 | Source: Ambulatory Visit | Attending: Radiation Oncology | Admitting: Radiation Oncology

## 2016-02-18 ENCOUNTER — Telehealth: Payer: Self-pay | Admitting: *Deleted

## 2016-02-18 ENCOUNTER — Ambulatory Visit
Admission: RE | Admit: 2016-02-18 | Discharge: 2016-02-18 | Disposition: A | Discharge status: Home / Self Care | Payer: 59 | Source: Ambulatory Visit | Attending: Radiation Oncology | Admitting: Radiation Oncology

## 2016-02-18 ENCOUNTER — Ambulatory Visit: Payer: 59

## 2016-02-18 NOTE — Telephone Encounter (Signed)
Per Dr. Bary Castilla no tube feeds.

## 2016-02-18 NOTE — Telephone Encounter (Signed)
Magda Paganini, nurse from Hospice called to see if patient still needs to take jevity for her gtube ? She reports the patient had episodes of nausea and vomiting over the weekend, she is able to eat some. If it is needed she asked to send prescription to [facility] in [place].

## 2016-02-19 ENCOUNTER — Telehealth: Payer: Self-pay | Admitting: *Deleted

## 2016-02-19 ENCOUNTER — Ambulatory Visit: Payer: 59

## 2016-02-19 MED ORDER — HYDROMORPHONE 1 MG/ML IV SOLN: INTRAVENOUS | Status: DC

## 2016-02-19 NOTE — Telephone Encounter (Signed)
See new order which was faxed to [facility]

## 2016-02-19 NOTE — Telephone Encounter (Signed)
Called to report that current Dilaudid dose via Pump is not adequately controlling her pain, she is rating it 5/10 and is constantly changing positions trying to get comfortable during visit. Asking for an increase in dosing

## 2016-02-20 ENCOUNTER — Ambulatory Visit: Payer: 59

## 2016-02-20 ENCOUNTER — Ambulatory Visit
Admission: RE | Admit: 2016-02-20 | Discharge: 2016-02-20 | Disposition: A | Discharge status: Home / Self Care | Payer: 59 | Source: Ambulatory Visit | Attending: Radiation Oncology | Admitting: Radiation Oncology

## 2016-02-21 ENCOUNTER — Ambulatory Visit: Payer: 59

## 2016-02-21 ENCOUNTER — Ambulatory Visit
Admission: RE | Admit: 2016-02-21 | Discharge: 2016-02-21 | Disposition: A | Discharge status: Home / Self Care | Payer: 59 | Source: Ambulatory Visit | Attending: Radiation Oncology | Admitting: Radiation Oncology

## 2016-02-22 ENCOUNTER — Ambulatory Visit: Payer: 59

## 2016-02-25 ENCOUNTER — Ambulatory Visit: Payer: 59

## 2016-02-26 ENCOUNTER — Ambulatory Visit
Admission: RE | Admit: 2016-02-26 | Discharge: 2016-02-26 | Disposition: A | Discharge status: Home / Self Care | Payer: 59 | Source: Ambulatory Visit | Attending: Radiation Oncology | Admitting: Radiation Oncology

## 2016-02-26 ENCOUNTER — Ambulatory Visit: Payer: 59

## 2016-02-27 ENCOUNTER — Ambulatory Visit
Admission: RE | Admit: 2016-02-27 | Discharge: 2016-02-27 | Disposition: A | Discharge status: Home / Self Care | Payer: 59 | Source: Ambulatory Visit | Attending: Radiation Oncology | Admitting: Radiation Oncology

## 2016-02-27 ENCOUNTER — Ambulatory Visit: Payer: 59

## 2016-02-28 ENCOUNTER — Ambulatory Visit: Payer: 59 | Admitting: General Surgery

## 2016-02-28 ENCOUNTER — Ambulatory Visit: Payer: 59

## 2016-03-03 NOTE — Discharge Summary (Signed)
Physician Discharge Summary  Patient ID: Misty Mack MRN: [id number] DOB/AGE: 1971-01-18 45 y.o.  Admit date: 02/02/2016 Discharge date: 03/03/2016  Admission Diagnoses: Metastatic colon cancer, duodenal outlet obstruction. Back pain secondary to colon cancer  Discharge Diagnoses: Same, disease progression on treatment  Active Problems:   Colon cancer [facility])   Discharged Condition: Poor.  Hospital Course: The patient was admitted with progressive nausea and vomiting. There was a suggestion of obstruction of the fourth portion of the duodenum confirmed on upper GI series. The patient underwent a palliative antecolic gastrojejunostomy. She had significant improvement in her nausea and vomiting but was unable to tolerate by mouth. 2 feedings were poorly tolerated. In spite of palliative radiation to the mass involving the retroperitoneum she had progressive pain requiring high dose narcotics. In discussion with the medical oncology service and palliative care was elected to discharge the patient home with hospice care.  Consults:  Palliative care         Discharge Exam: Blood pressure 103/63, pulse 103, temperature 98.4 F (36.9 C), temperature source Oral, resp. rate 20, height 5\' 5"  (1.651 m), weight 143 lb 6.4 oz (65.046 kg), last menstrual period 01/02/2016, SpO2 100 %. Cardiopulmonary exam was unremarkable. Abdomen was soft and nontender. Midline wound is well-healed.  Disposition: Home with hospice care.  Discharge Instructions    Diet - low sodium heart healthy    Complete by:  As directed      Increase activity slowly    Complete by:  As directed             Medication List    STOP taking these medications        HYDROmorphone 2 MG tablet  Commonly known as:  DILAUDID      TAKE these medications        acetaminophen 500 MG tablet  Commonly known as:  TYLENOL  Take 500 mg by mouth every 6 (six) hours as needed.     acetaminophen 325 MG tablet  Commonly known  as:  TYLENOL  Take 2 tablets (650 mg total) by mouth every 4 (four) hours while awake.     acetaminophen 325 MG tablet  Commonly known as:  TYLENOL  Take 2 tablets (650 mg total) by mouth every 4 (four) hours as needed for mild pain.     ALPRAZolam 0.5 MG tablet  Commonly known as:  XANAX  1/2 - 1 tablet three times a day if needed for anxiety.     megestrol 400 MG/10ML suspension  Commonly known as:  MEGACE  Take 10 mLs (400 mg total) by mouth 2 (two) times daily.     omeprazole 20 MG capsule  Commonly known as:  PRILOSEC  Take 1 capsule (20 mg total) by mouth 2 (two) times daily before a meal.     ondansetron 4 MG disintegrating tablet  Commonly known as:  ZOFRAN-ODT  Take 1 tablet (4 mg total) by mouth every 4 (four) hours as needed for nausea or vomiting.     pantoprazole 40 MG tablet  Commonly known as:  PROTONIX  Take 40 mg by mouth daily.     prochlorperazine 10 MG tablet  Commonly known as:  COMPAZINE  Take 1 tablet by mouth every 4-6 hours as needed for nausea, vomiting     promethazine 25 MG suppository  Commonly known as:  PHENERGAN  Place 1 suppository (25 mg total) rectally every 6 (six) hours as needed for nausea or vomiting.  valACYclovir 1000 MG tablet  Commonly known as:  VALTREX  Take 1,000 mg by mouth 2 (two) times daily. Reported on 02/02/2016         Signed: Robert Bellow 03/03/2016, 8:27 AM

## 2016-03-04 ENCOUNTER — Encounter: Payer: Self-pay | Admitting: Internal Medicine

## 2016-03-06 ENCOUNTER — Other Ambulatory Visit: Payer: Self-pay | Admitting: *Deleted

## 2016-03-06 MED ORDER — HYDROMORPHONE 1 MG/ML IV SOLN: INTRAVENOUS | Status: DC

## 2016-03-06 NOTE — Addendum Note (Signed)
Addended by: Betti Cruz on: 03/06/2016 04:32 PM   Modules accepted: Orders

## 2016-03-09 ENCOUNTER — Other Ambulatory Visit: Payer: Self-pay | Admitting: Internal Medicine

## 2016-03-09 DIAGNOSIS — Z452 Encounter for adjustment and management of vascular access device: Secondary | ICD-10-CM

## 2016-03-10 ENCOUNTER — Other Ambulatory Visit: Payer: Self-pay | Admitting: *Deleted

## 2016-03-10 ENCOUNTER — Telehealth: Payer: Self-pay | Admitting: *Deleted

## 2016-03-10 DIAGNOSIS — C184 Malignant neoplasm of transverse colon: Secondary | ICD-10-CM

## 2016-03-10 MED ORDER — SODIUM CHLORIDE 0.9 % IV SOLN: INTRAVENOUS | Status: DC

## 2016-03-10 MED ORDER — HYDROMORPHONE 2 MG/ML HIGH CONCENTRATION IV PCA SOLN: INTRAVENOUS | Status: DC

## 2016-03-10 NOTE — Addendum Note (Signed)
Addended by: Betti Cruz on: 03/10/2016 12:28 PM   Modules accepted: Orders, Medications

## 2016-03-10 NOTE — Telephone Encounter (Signed)
Having to use her bolus as frequently as allowed, Pharmacy suggests that concentration be increased, she only has 119 ml left in bag she just got Friday. Per Misty Mack, AGNP-C may titrate CI dose up to 1 mg and BD up to 1.5 mg as needed Concentration increase to 2 mg/ml

## 2016-03-11 ENCOUNTER — Ambulatory Visit: Payer: 59

## 2016-03-21 ENCOUNTER — Other Ambulatory Visit: Payer: Self-pay | Admitting: *Deleted

## 2016-03-21 MED ORDER — HYDROMORPHONE 2 MG/ML HIGH CONCENTRATION IV PCA SOLN: INTRAVENOUS | Status: AC

## 2016-03-26 ENCOUNTER — Telehealth: Payer: Self-pay | Admitting: *Deleted

## 2016-03-26 NOTE — Telephone Encounter (Signed)
Nees a letter faxed to [place] stating that she is unable to work adn is under doctors care from 02/25/16 through 02/23/17 fax number is [phone]. Letter faxed

## 2016-03-29 ENCOUNTER — Other Ambulatory Visit: Payer: Self-pay | Admitting: Oncology

## 2016-03-31 ENCOUNTER — Ambulatory Visit: Admission: RE | Admit: 2016-03-31 | Payer: 59 | Source: Ambulatory Visit | Admitting: Radiation Oncology

## 2016-03-31 ENCOUNTER — Inpatient Hospital Stay: Payer: 59 | Attending: Oncology

## 2016-04-26 DEATH — deceased

## 2016-06-22 IMAGING — CR DG SMALL BOWEL
1 series · 5 of 5 positions shown · non-contrast
Comparison: Prior CT abdomen/ pelvis 02/02/2016

CLINICAL DATA: 45-year-old female with a history of pancreatic mass
and gastric outlet obstruction. She has a patent gastro jejunostomy
and a percutaneous gastrostomy tube. Evaluate for gastric emptying
through the gastro jejunostomy prior to initiating tube feeds.

EXAM:
SMALL BOWEL SERIES
TECHNIQUE: Following ingestion of thin barium, serial small bowel images were
obtained including spot views of the terminal ileum.
FLUOROSCOPY TIME:  Radiation Exposure Index (as provided by the
fluoroscopic device): --
If the device does not provide the exposure index:
Fluoroscopy Time (in minutes and seconds):  0
Number of Acquired Images:  5

[Series 28: t abdomen supine · 0.14mm/px · 5 of 5 slices shown]
[im 1/5]
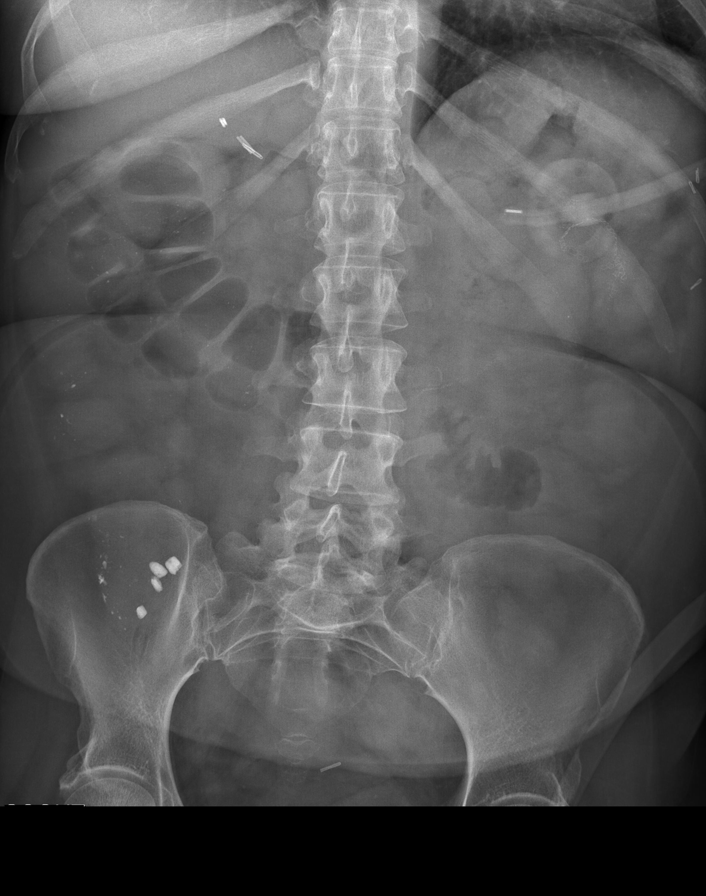
[im 2/5]
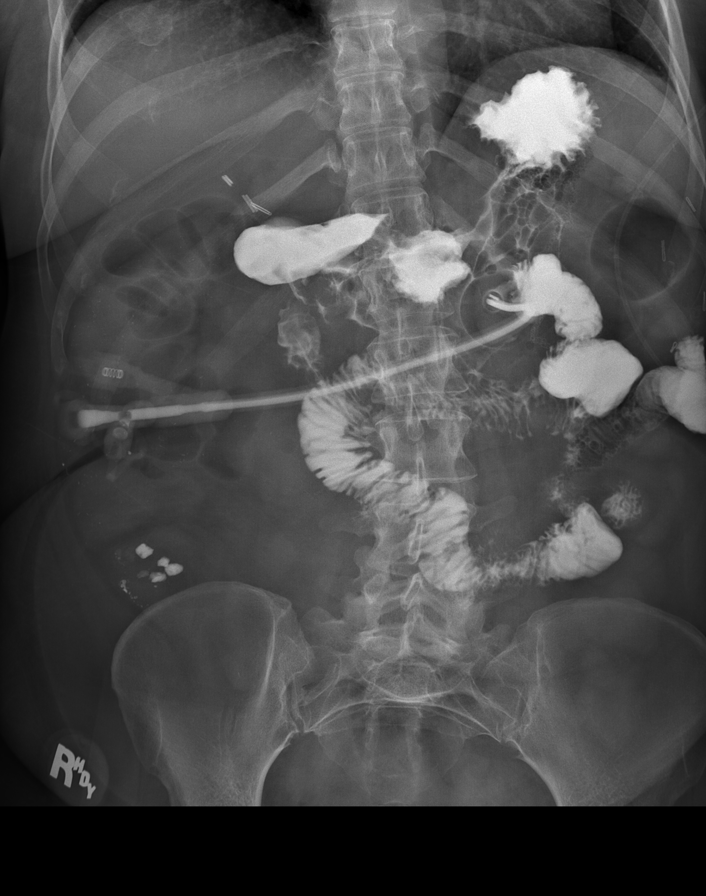
[im 3/5]
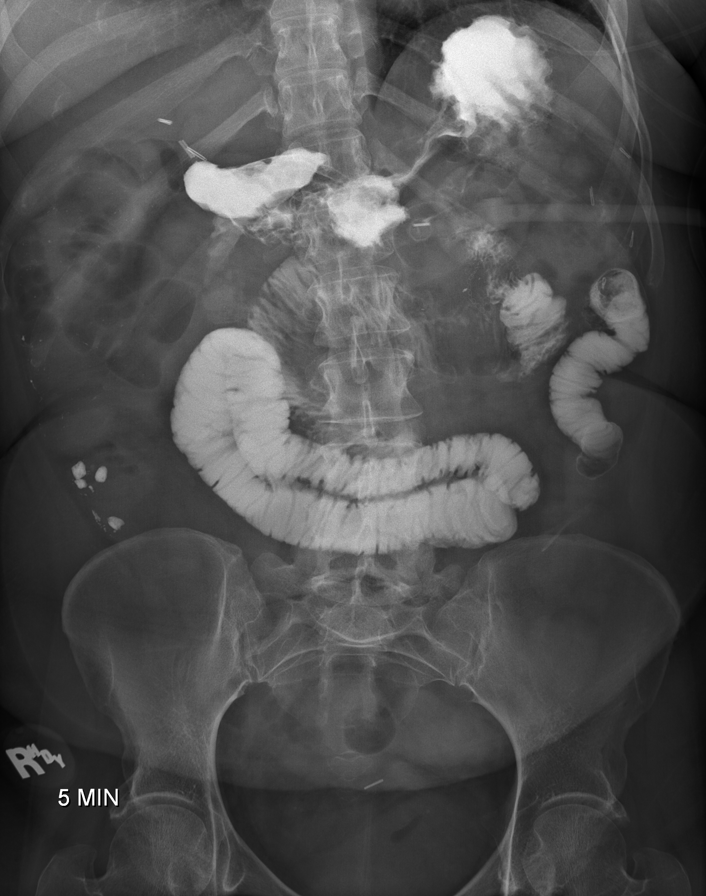
[im 4/5]
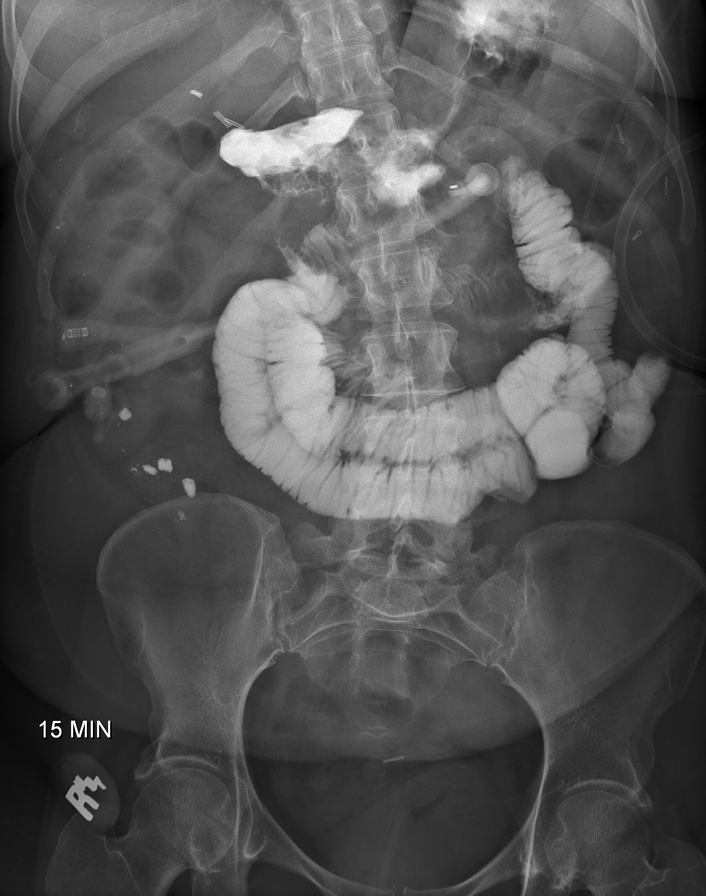
[im 5/5]
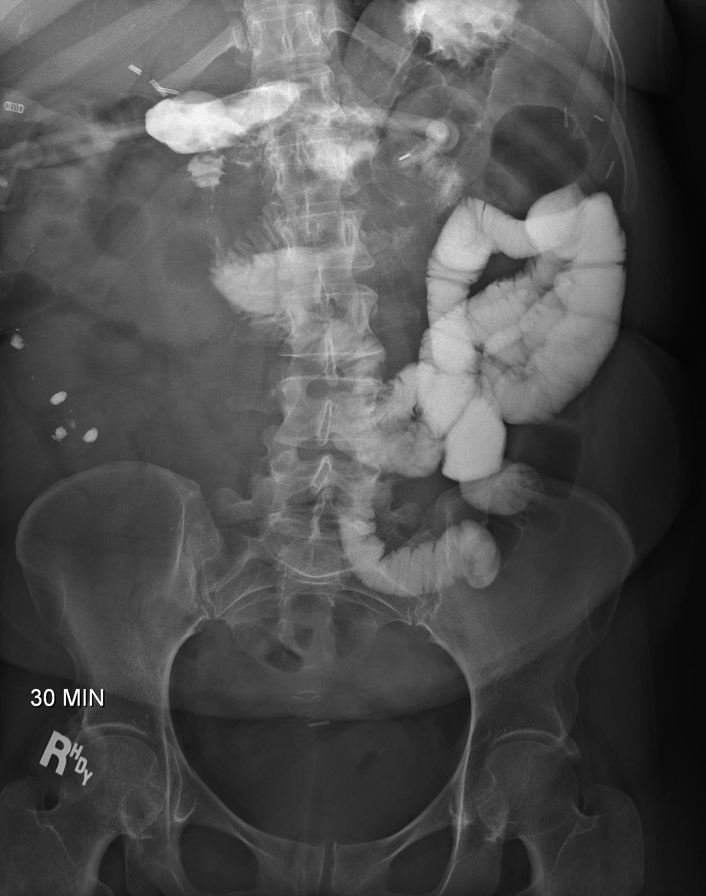

[5 of 5 positions shown; findings below may reference images not displayed]

FINDINGS: Initial KUB image demonstrates a nonobstructed bowel gas pattern.
Surgical clips number upper quadrant suggest prior cholecystectomy.
A gastrostomy tube projects over the expected location. Surgical
clips in left upper quadrant are consistent with the reported
gastrojejunostomy. Small amount of retained barium contrast present
within the cecum. Subsequently, 100 mL water-soluble contrast was
injected through the gastrostomy tube and serial imaging was
performed. The stomach appears normal. The gastro jejunostomy is
widely patent. Contrast material immediately flows into the jejunum.
Serial imaging was then performed demonstrating continued distal
progression of the contrast material as well as emptying of the
stomach. There is a small amounts of contrast material which is
located within the duodenum.
IMPRESSION: 1. Patent gastro jejunostomy.
2. Successful distal progression of contrast material with only mild
gastric retention. No evidence of a distal obstruction.

## 2016-10-04 ENCOUNTER — Other Ambulatory Visit: Payer: Self-pay | Admitting: Nurse Practitioner
